# Patient Record
Sex: Female | Born: 1995 | Race: Black or African American | Hispanic: No | Marital: Single | Smoking: Never smoker
Health system: Southern US, Community
[De-identification: ages and names within clinical notes are randomized; demographics above are authoritative.]

## PROBLEM LIST (undated history)

## (undated) ENCOUNTER — Inpatient Hospital Stay (HOSPITAL_COMMUNITY): Payer: Self-pay

## (undated) DIAGNOSIS — Z789 Other specified health status: Secondary | ICD-10-CM

## (undated) DIAGNOSIS — A5602 Chlamydial vulvovaginitis: Secondary | ICD-10-CM

## (undated) DIAGNOSIS — D649 Anemia, unspecified: Secondary | ICD-10-CM

## (undated) DIAGNOSIS — I10 Essential (primary) hypertension: Secondary | ICD-10-CM

---
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
## 2015-01-12 ENCOUNTER — Inpatient Hospital Stay (HOSPITAL_COMMUNITY)
Admission: AD | Admit: 2015-01-12 | Discharge: 2015-01-13 | Disposition: A | Discharge status: Home / Self Care | Payer: Medicaid Other | Source: Ambulatory Visit | Attending: Obstetrics and Gynecology | Admitting: Obstetrics and Gynecology

## 2015-01-12 DIAGNOSIS — O26893 Other specified pregnancy related conditions, third trimester: Secondary | ICD-10-CM | POA: Insufficient documentation

## 2015-01-12 DIAGNOSIS — R1084 Generalized abdominal pain: Secondary | ICD-10-CM | POA: Insufficient documentation

## 2015-01-12 DIAGNOSIS — A5602 Chlamydial vulvovaginitis: Secondary | ICD-10-CM | POA: Diagnosis present

## 2015-01-12 DIAGNOSIS — A5609 Other chlamydial infection of lower genitourinary tract: Secondary | ICD-10-CM

## 2015-01-12 DIAGNOSIS — Z3A26 26 weeks gestation of pregnancy: Secondary | ICD-10-CM | POA: Insufficient documentation

## 2015-01-12 DIAGNOSIS — Z3492 Encounter for supervision of normal pregnancy, unspecified, second trimester: Secondary | ICD-10-CM

## 2015-01-12 NOTE — MAU Note (Signed)
Pt reports lower abd cramping since this am, some dysuria. Denies bleeding.

## 2015-01-13 ENCOUNTER — Telehealth: Payer: Self-pay | Admitting: *Deleted

## 2015-01-13 ENCOUNTER — Telehealth: Payer: Self-pay | Admitting: Obstetrics and Gynecology

## 2015-01-13 ENCOUNTER — Encounter (HOSPITAL_COMMUNITY): Payer: Self-pay | Admitting: *Deleted

## 2015-01-13 DIAGNOSIS — O98312 Other infections with a predominantly sexual mode of transmission complicating pregnancy, second trimester: Secondary | ICD-10-CM

## 2015-01-13 DIAGNOSIS — R1084 Generalized abdominal pain: Secondary | ICD-10-CM | POA: Diagnosis present

## 2015-01-13 DIAGNOSIS — O9989 Other specified diseases and conditions complicating pregnancy, childbirth and the puerperium: Secondary | ICD-10-CM

## 2015-01-13 DIAGNOSIS — A5609 Other chlamydial infection of lower genitourinary tract: Secondary | ICD-10-CM

## 2015-01-13 DIAGNOSIS — Z3A26 26 weeks gestation of pregnancy: Secondary | ICD-10-CM

## 2015-01-13 DIAGNOSIS — A5602 Chlamydial vulvovaginitis: Secondary | ICD-10-CM

## 2015-01-13 DIAGNOSIS — R109 Unspecified abdominal pain: Secondary | ICD-10-CM

## 2015-01-13 DIAGNOSIS — O26893 Other specified pregnancy related conditions, third trimester: Secondary | ICD-10-CM | POA: Diagnosis not present

## 2015-01-13 HISTORY — DX: Other chlamydial infection of lower genitourinary tract: A56.09

## 2015-01-13 HISTORY — DX: Chlamydial vulvovaginitis: A56.02

## 2015-01-13 LAB — URINE MICROSCOPIC-ADD ON

## 2015-01-13 LAB — URINALYSIS, ROUTINE W REFLEX MICROSCOPIC
Bilirubin Urine: NEGATIVE
GLUCOSE, UA: NEGATIVE mg/dL
Hgb urine dipstick: NEGATIVE
Ketones, ur: NEGATIVE mg/dL
Nitrite: NEGATIVE
PROTEIN: NEGATIVE mg/dL
SPECIFIC GRAVITY, URINE: 1.025 (ref 1.005–1.030)
Urobilinogen, UA: 0.2 mg/dL (ref 0.0–1.0)
pH: 7 (ref 5.0–8.0)

## 2015-01-13 LAB — GC/CHLAMYDIA PROBE AMP (~~LOC~~) NOT AT ARMC
CHLAMYDIA, DNA PROBE: POSITIVE — AB
Neisseria Gonorrhea: NEGATIVE

## 2015-01-13 LAB — WET PREP, GENITAL
Clue Cells Wet Prep HPF POC: NONE SEEN
Trich, Wet Prep: NONE SEEN
Yeast Wet Prep HPF POC: NONE SEEN

## 2015-01-13 MED ORDER — AZITHROMYCIN 500 MG PO TABS: 1000.0000 mg | ORAL_TABLET | Freq: Once | ORAL | Status: DC

## 2015-01-13 NOTE — MAU Provider Note (Signed)
History     CSN: [id number]  Arrival date and time: 01/12/15 2343   First Provider Initiated Contact with Patient 01/13/15 0056      No chief complaint on file.  HPI  Patient is 19 y.o. G2P1001 [redacted]w[redacted]d here with complaints of abdominal pain.  Ms. Steines reports that earlier today she began experiencing generalized abdominal pain. The pain has now resolved. She said the last time she felt the pain was early afternoon (~12 hours ago). Denies contractions, vaginal discharge, LOF, VB. Endorses FM.    History reviewed. No pertinent past medical history.   History reviewed. No pertinent past surgical history.  History reviewed. No pertinent family history.  Social History  Substance Use Topics  . Smoking status: Never Smoker   . Smokeless tobacco: Never Used  . Alcohol Use: No    Allergies: Not on File  No prescriptions prior to admission    Review of Systems  Gastrointestinal: Positive for abdominal pain. Negative for nausea and vomiting.  Genitourinary: Negative for dysuria and urgency.   Physical Exam   Blood pressure 108/56, pulse 107, temperature 98.4 F (36.9 C), temperature source Oral, resp. rate 18, height 5' 2.5" (1.588 m), weight 145 lb (65.772 kg), SpO2 100 %.  Physical Exam  Nursing note and vitals reviewed. Constitutional: She is oriented to person, place, and time. She appears well-developed and well-nourished. No distress.  HENT:  Head: Normocephalic and atraumatic.  Cardiovascular: Normal rate and regular rhythm.   Respiratory: Effort normal. No respiratory distress.  GI: Soft. There is no tenderness.  Gravid  Genitourinary:  Speculum exam - small white vaginal discharge noted, friable cervix, no pooling of liquid Cervical exam - cervix closed and thick   Musculoskeletal: Normal range of motion. She exhibits no edema or tenderness.  Neurological: She is alert and oriented to person, place, and time. No cranial nerve deficit.  Skin: Skin is warm and  dry.  Psychiatric: She has a normal mood and affect. Her behavior is normal.    MAU Course  Procedures None  MDM Speculum exam -small white discharge noted, minimal blood after swabbing cervix, no pooling of fluid Cervix check - not dilated, thick UA - minimal leukocytes, no other abnormalities GC/chlamydia - pending Wet prep - few WBC, no other abnormalities  No contractions seen on strip  Assessment and Plan  \A: Patient is 19 y.o. G2P1001 [redacted]w[redacted]d reporting abdominal pain. Given clear UA, unlikely to be caused by UTI/other urinary tract etiology. Negative wet prep rules out other infectious etiologies. Given closed cervix and no contractions seen on monitoring, patient is not in labor at this time. Will discharge home.   P: Discharge home - Handout given regarding second trimester pregnancy  - Follow-up with OB provider - F/u on GC/chlamydia  - Have scheduled outpatient anatomy US for patient given lack of prenatal imaging thus far   Tarri Abernethy, MD PGY-1 [facility] 01/13/2015, 2:22 AM   CNM attestation:  I have seen and examined this patient; I agree with above documentation in the resident's note.   KYNSLEE BAHAM is a 19 y.o. G2P1001 reporting previous abd pain +FM, denies LOF, VB, contractions, vaginal discharge.  PE: BP 108/56 mmHg  Pulse 107  Temp(Src) 98.4 F (36.9 C) (Oral)  Resp 18  Ht 5' 2.5" (1.588 m)  Wt 65.772 kg (145 lb)  BMI 26.08 kg/m2  SpO2 100%  LMP  Gen: calm comfortable, NAD Resp: normal effort, no distress Abd: gravid  ROS,  labs, PMH reviewed NST reactive   Plan: D/C home w/ order for out pt anatomy [facility] due to gestation- number given to establish care  Cam Hai, CNM 6:47 AM 01/13/2015

## 2015-01-13 NOTE — Discharge Instructions (Signed)
For assistance with applying for medicaid please call Fausto Skillern [phone] or Anne Hahn [phone].   Second Trimester of Pregnancy The second trimester is from week 13 through week 28, months 4 through 6. The second trimester is often a time when you feel your best. Your body has also adjusted to being pregnant, and you begin to feel better physically. Usually, morning sickness has lessened or quit completely, you may have more energy, and you may have an increase in appetite. The second trimester is also a time when the fetus is growing rapidly. At the end of the sixth month, the fetus is about 9 inches long and weighs about 1 pounds. You will likely begin to feel the baby move (quickening) between 18 and 20 weeks of the pregnancy. BODY CHANGES Your body goes through many changes during pregnancy. The changes vary from woman to woman.   Your weight will continue to increase. You will notice your lower abdomen bulging out.  You may begin to get stretch marks on your hips, abdomen, and breasts.  You may develop headaches that can be relieved by medicines approved by your health care provider.  You may urinate more often because the fetus is pressing on your bladder.  You may develop or continue to have heartburn as a result of your pregnancy.  You may develop constipation because certain hormones are causing the muscles that push waste through your intestines to slow down.  You may develop hemorrhoids or swollen, bulging veins (varicose veins).  You may have back pain because of the weight gain and pregnancy hormones relaxing your joints between the bones in your pelvis and as a result of a shift in weight and the muscles that support your balance.  Your breasts will continue to grow and be tender.  Your gums may bleed and may be sensitive to brushing and flossing.  Dark spots or blotches (chloasma, mask of pregnancy) may develop on your face. This will likely fade after the baby is  born.  A dark line from your belly button to the pubic area (linea nigra) may appear. This will likely fade after the baby is born.  You may have changes in your hair. These can include thickening of your hair, rapid growth, and changes in texture. Some women also have hair loss during or after pregnancy, or hair that feels dry or thin. Your hair will most likely return to normal after your baby is born. WHAT TO EXPECT AT YOUR PRENATAL VISITS During a routine prenatal visit:  You will be weighed to make sure you and the fetus are growing normally.  Your blood pressure will be taken.  Your abdomen will be measured to track your baby's growth.  The fetal heartbeat will be listened to.  Any test results from the previous visit will be discussed. Your health care provider may ask you:  How you are feeling.  If you are feeling the baby move.  If you have had any abnormal symptoms, such as leaking fluid, bleeding, severe headaches, or abdominal cramping.  If you have any questions. Other tests that may be performed during your second trimester include:  Blood tests that check for:  Low iron levels (anemia).  Gestational diabetes (between 24 and 28 weeks).  Rh antibodies.  Urine tests to check for infections, diabetes, or protein in the urine.  An ultrasound to confirm the proper growth and development of the baby.  An amniocentesis to check for possible genetic problems.  Fetal screens for  bifida and Down syndrome. °HOME CARE INSTRUCTIONS  °· Avoid all smoking, herbs, alcohol, and unprescribed drugs. These chemicals affect the formation and growth of the baby. °· Follow your health care provider's instructions regarding medicine use. There are medicines that are either safe or unsafe to take during pregnancy. °· Exercise only as directed by your health care provider. Experiencing uterine cramps is a good sign to stop exercising. °· Continue to eat regular, healthy  meals. °· Wear a good support bra for breast tenderness. °· Do not use hot tubs, steam rooms, or saunas. °· Wear your seat belt at all times when driving. °· Avoid raw meat, uncooked cheese, cat litter boxes, and soil used by cats. These carry germs that can cause birth defects in the baby. °· Take your prenatal vitamins. °· Try taking a stool softener (if your health care provider approves) if you develop constipation. Eat more high-fiber foods, such as fresh vegetables or fruit and whole grains. Drink plenty of fluids to keep your urine clear or pale yellow. °· Take warm sitz baths to soothe any pain or discomfort caused by hemorrhoids. Use hemorrhoid cream if your health care provider approves. °· If you develop varicose veins, wear support hose. Elevate your feet for 15 minutes, 3-4 times a day. Limit salt in your diet. °· Avoid heavy lifting, wear low heel shoes, and practice good posture. °· Rest with your legs elevated if you have leg cramps or low back pain. °· Visit your dentist if you have not gone yet during your pregnancy. Use a soft toothbrush to brush your teeth and be gentle when you floss. °· A sexual relationship may be continued unless your health care provider directs you otherwise. °· Continue to go to all your prenatal visits as directed by your health care provider. °SEEK MEDICAL CARE IF:  °· You have dizziness. °· You have mild pelvic cramps, pelvic pressure, or nagging pain in the abdominal area. °· You have persistent nausea, vomiting, or diarrhea. °· You have a bad smelling vaginal discharge. °· You have pain with urination. °SEEK IMMEDIATE MEDICAL CARE IF:  °· You have a fever. °· You are leaking fluid from your vagina. °· You have spotting or bleeding from your vagina. °· You have severe abdominal cramping or pain. °· You have rapid weight gain or loss. °· You have shortness of breath with chest pain. °· You notice sudden or extreme swelling of your face, hands, ankles, feet, or  legs. °· You have not felt your baby move in over an hour. °· You have severe headaches that do not go away with medicine. °· You have vision changes. °Document Released: 04/17/2001 Document Revised: 04/28/2013 Document Reviewed: 06/24/2012 °[employer]® Patient Information ©2015 [employer]. This information is not intended to replace advice given to you by your health care provider. Make sure you discuss any questions you have with your health care provider. ° °

## 2015-01-13 NOTE — Progress Notes (Signed)
Dr Natale Milch notified of pt's complaints, states she will come and evaluate pt

## 2015-01-13 NOTE — Telephone Encounter (Signed)
Patient called and left message that she wants to schedule an [place] appointment. [place] scheduled for 01/24/15 @ 9:30 am. Patient was called and given appointment date and time. Patient wants to start prenatal care. Informed patient that the front office will contact her for appointment scheduling. Patient verbalized understanding and has no other questions.

## 2015-01-13 NOTE — Telephone Encounter (Signed)
Pt informed of Pos Chlamydia, she is not currently with FOB, informed pt that FOB will need tx also. Pt Rx for azithromycin called to [facility] [address]

## 2015-01-17 NOTE — Progress Notes (Deleted)
Received referral to follow up with patient regarding Advance Directives.  Pt was with FOB and in labor.  Provided education regarding Advance Directives and patient decided she did not want to complete one at this time.  Pt is not married to FOB and does not want him to be the one who makes medical decisions on her behalf she would like her parents to continue to play this role as is currently indicated by [place] law.  Left pt and family with educational material "Do You Need Advance Directives?"  No further needs at this time.  [address] [place], [place] [phone]

## 2015-01-21 ENCOUNTER — Other Ambulatory Visit (HOSPITAL_COMMUNITY): Payer: Self-pay | Admitting: Advanced Practice Midwife

## 2015-01-21 DIAGNOSIS — Z3492 Encounter for supervision of normal pregnancy, unspecified, second trimester: Secondary | ICD-10-CM

## 2015-01-21 DIAGNOSIS — Z3A27 27 weeks gestation of pregnancy: Secondary | ICD-10-CM

## 2015-01-24 ENCOUNTER — Ambulatory Visit (HOSPITAL_COMMUNITY)
Admission: RE | Admit: 2015-01-24 | Discharge: 2015-01-24 | Disposition: A | Discharge status: Home / Self Care | Payer: Medicaid Other | Source: Ambulatory Visit | Attending: Advanced Practice Midwife | Admitting: Advanced Practice Midwife

## 2015-01-24 DIAGNOSIS — Z3A27 27 weeks gestation of pregnancy: Secondary | ICD-10-CM | POA: Diagnosis not present

## 2015-01-24 DIAGNOSIS — Z3492 Encounter for supervision of normal pregnancy, unspecified, second trimester: Secondary | ICD-10-CM

## 2015-01-24 DIAGNOSIS — Z36 Encounter for antenatal screening of mother: Secondary | ICD-10-CM | POA: Insufficient documentation

## 2015-02-03 ENCOUNTER — Encounter: Payer: Self-pay | Admitting: Family

## 2015-02-18 ENCOUNTER — Telehealth: Payer: Self-pay | Admitting: *Deleted

## 2015-02-18 MED ORDER — AZITHROMYCIN 500 MG PO TABS: 1000.0000 mg | ORAL_TABLET | Freq: Once | ORAL | Status: DC

## 2015-02-18 NOTE — Telephone Encounter (Signed)
Pt left message stating that she went to the pharmacy to pick up her medication and was told that they had not received the prescription. Per chart review, Rx did not get e-prescribed properly.  Pt advised that it I will re-send now and she should go today to pick it up.  Pt states that she did not previously have transportation to get the medication. She confirms that she has not had intercourse since being informed of + Chlamydia infection on 9/8 by Dr. Emelda FearFerguson and that she has informed the FOB that he needs treatment. Pt voiced understanding of all information given.

## 2015-03-01 ENCOUNTER — Encounter: Payer: Self-pay | Admitting: Family

## 2015-04-01 ENCOUNTER — Encounter (HOSPITAL_COMMUNITY): Payer: Self-pay

## 2015-04-01 ENCOUNTER — Inpatient Hospital Stay (HOSPITAL_COMMUNITY)
Admission: AD | Admit: 2015-04-01 | Discharge: 2015-04-01 | Disposition: A | Discharge status: Home / Self Care | Payer: Medicaid Other | Source: Ambulatory Visit | Attending: Family Medicine | Admitting: Family Medicine

## 2015-04-01 DIAGNOSIS — O0933 Supervision of pregnancy with insufficient antenatal care, third trimester: Secondary | ICD-10-CM | POA: Diagnosis not present

## 2015-04-01 DIAGNOSIS — Z3A37 37 weeks gestation of pregnancy: Secondary | ICD-10-CM | POA: Diagnosis not present

## 2015-04-01 DIAGNOSIS — Z283 Underimmunization status: Secondary | ICD-10-CM

## 2015-04-01 DIAGNOSIS — O9989 Other specified diseases and conditions complicating pregnancy, childbirth and the puerperium: Secondary | ICD-10-CM

## 2015-04-01 HISTORY — DX: Other specified health status: Z78.9

## 2015-04-01 LAB — URINALYSIS, ROUTINE W REFLEX MICROSCOPIC
BILIRUBIN URINE: NEGATIVE
Glucose, UA: NEGATIVE mg/dL
Hgb urine dipstick: NEGATIVE
KETONES UR: NEGATIVE mg/dL
NITRITE: NEGATIVE
Protein, ur: NEGATIVE mg/dL
Specific Gravity, Urine: 1.02 (ref 1.005–1.030)
pH: 7.5 (ref 5.0–8.0)

## 2015-04-01 LAB — URINE MICROSCOPIC-ADD ON

## 2015-04-01 LAB — CBC
HEMATOCRIT: 30.8 % — AB (ref 36.0–46.0)
HEMOGLOBIN: 10.2 g/dL — AB (ref 12.0–15.0)
MCH: 26.9 pg (ref 26.0–34.0)
MCHC: 33.1 g/dL (ref 30.0–36.0)
MCV: 81.3 fL (ref 78.0–100.0)
Platelets: 155 10*3/uL (ref 150–400)
RBC: 3.79 MIL/uL — AB (ref 3.87–5.11)
RDW: 14 % (ref 11.5–15.5)
WBC: 7.1 10*3/uL (ref 4.0–10.5)

## 2015-04-01 LAB — DIFFERENTIAL
Basophils Absolute: 0 10*3/uL (ref 0.0–0.1)
Basophils Relative: 0 %
EOS PCT: 0 %
Eosinophils Absolute: 0 10*3/uL (ref 0.0–0.7)
LYMPHS PCT: 17 %
Lymphs Abs: 1.2 10*3/uL (ref 0.7–4.0)
MONO ABS: 0.7 10*3/uL (ref 0.1–1.0)
MONOS PCT: 10 %
Neutro Abs: 5.1 10*3/uL (ref 1.7–7.7)
Neutrophils Relative %: 73 %

## 2015-04-01 LAB — GROUP B STREP BY PCR: Group B strep by PCR: NEGATIVE

## 2015-04-01 LAB — TYPE AND SCREEN
ABO/RH(D): B NEG
Antibody Screen: NEGATIVE

## 2015-04-01 LAB — ABO/RH: ABO/RH(D): B NEG

## 2015-04-01 LAB — RAPID HIV SCREEN (HIV 1/2 AB+AG)
HIV 1/2 ANTIBODIES: NONREACTIVE
HIV-1 P24 Antigen - HIV24: NONREACTIVE

## 2015-04-01 NOTE — Discharge Instructions (Signed)
Braxton Hicks Contractions Contractions of the uterus can occur throughout pregnancy. Contractions are not always a sign that you are in labor.  WHAT ARE BRAXTON HICKS CONTRACTIONS?  Contractions that occur before labor are called Braxton Hicks contractions, or false labor. Toward the end of pregnancy (32-34 weeks), these contractions can develop more often and may become more forceful. This is not true labor because these contractions do not result in opening (dilatation) and thinning of the cervix. They are sometimes difficult to tell apart from true labor because these contractions can be forceful and people have different pain tolerances. You should not feel embarrassed if you go to the hospital with false labor. Sometimes, the only way to tell if you are in true labor is for your health care provider to look for changes in the cervix. If there are no prenatal problems or other health problems associated with the pregnancy, it is completely safe to be sent home with false labor and await the onset of true labor. HOW CAN YOU TELL THE DIFFERENCE BETWEEN TRUE AND FALSE LABOR? False Labor  The contractions of false labor are usually shorter and not as hard as those of true labor.   The contractions are usually irregular.   The contractions are often felt in the front of the lower abdomen and in the groin.   The contractions may go away when you walk around or change positions while lying down.   The contractions get weaker and are shorter lasting as time goes on.   The contractions do not usually become progressively stronger, regular, and closer together as with true labor.  True Labor  Contractions in true labor last 30-70 seconds, become very regular, usually become more intense, and increase in frequency.   The contractions do not go away with walking.   The discomfort is usually felt in the top of the uterus and spreads to the lower abdomen and low back.   True labor can be  determined by your health care provider with an exam. This will show that the cervix is dilating and getting thinner.  WHAT TO REMEMBER  Keep up with your usual exercises and follow other instructions given by your health care provider.   Take medicines as directed by your health care provider.   Keep your regular prenatal appointments.   Eat and drink lightly if you think you are going into labor.   If Braxton Hicks contractions are making you uncomfortable:   Change your position from lying down or resting to walking, or from walking to resting.   Sit and rest in a tub of warm water.   Drink 2-3 glasses of water. Dehydration may cause these contractions.   Do slow and deep breathing several times an hour.  WHEN SHOULD I SEEK IMMEDIATE MEDICAL CARE? Seek immediate medical care if:  Your contractions become stronger, more regular, and closer together.   You have fluid leaking or gushing from your vagina.   You have a fever.   You pass blood-tinged mucus.   You have vaginal bleeding.   You have continuous abdominal pain.   You have low back pain that you never had before.   You feel your baby's head pushing down and causing pelvic pressure.   Your baby is not moving as much as it used to.    This information is not intended to replace advice given to you by your health care provider. Make sure you discuss any questions you have with your health care  provider.   Document Released: 04/23/2005 Document Revised: 04/28/2013 Document Reviewed: 02/02/2013 Elsevier Interactive Patient Education 2016 [employer]  Vaginal Delivery During delivery, your health care provider will help you give birth to your baby. During a vaginal delivery, you will work to push the baby out of your vagina. However, before you can push your baby out, a few things need to happen. The opening of your uterus (cervix) has to soften, thin out, and open up (dilate) all the way to 10  cm. Also, your baby has to move down from the uterus into your vagina.  SIGNS OF LABOR  Your health care provider will first need to make sure you are in labor. Signs of labor include:   Passing what is called the mucous plug before labor begins. This is a small amount of blood-stained mucus.  Having regular, painful uterine contractions.   The time between contractions gets shorter.   The discomfort and pain gradually get more intense.  Contraction pains get worse when walking and do not go away when resting.   Your cervix becomes thinner (effacement) and dilates. BEFORE THE DELIVERY Once you are in labor and admitted into the hospital or care center, your health care provider may do the following:   Perform a complete physical exam.  Review any complications related to pregnancy or labor.  Check your blood pressure, pulse, temperature, and heart rate (vital signs).   Determine if, and when, the rupture of amniotic membranes occurred.  Do a vaginal exam (using a sterile glove and lubricant) to determine:   The position (presentation) of the baby. Is the baby's head presenting first (vertex) in the birth canal (vagina), or are the feet or buttocks first (breech)?   The level (station) of the baby's head within the birth canal.   The effacement and dilatation of the cervix.   An electronic fetal monitor is usually placed on your abdomen when you first arrive. This is used to monitor your contractions and the baby's heart rate.  When the monitor is on your abdomen (external fetal monitor), it can only pick up the frequency and length of your contractions. It cannot tell the strength of your contractions.  If it becomes necessary for your health care provider to know exactly how strong your contractions are or to see exactly what the baby's heart rate is doing, an internal monitor may be inserted into your vagina and uterus. Your health care provider will discuss the  benefits and risks of using an internal monitor and obtain your permission before inserting the device.  Continuous fetal monitoring may be needed if you have an epidural, are receiving certain medicines (such as oxytocin), or have pregnancy or labor complications.  An IV access tube may be placed into a vein in your arm to deliver fluids and medicines if necessary. THREE STAGES OF LABOR AND DELIVERY Normal labor and delivery is divided into three stages. First Stage This stage starts when you begin to contract regularly and your cervix begins to efface and dilate. It ends when your cervix is completely open (fully dilated). The first stage is the longest stage of labor and can last from 3 hours to 15 hours.  Several methods are available to help with labor pain. You and your health care provider will decide which option is best for you. Options include:   Opioid medicines. These are strong pain medicines that you can get through your IV tube or as a shot into your muscle. These medicines  lessen pain but do not make it go away completely.  Epidural. A medicine is given through a thin tube that is inserted in your back. The medicine numbs the lower part of your body and prevents any pain in that area.  Paracervical pain medicine. This is an injection of an anesthetic on each side of your cervix.   You may request natural childbirth, which does not involve the use of pain medicines or an epidural during labor and delivery. Instead, you will use other things, such as breathing exercises, to help cope with the pain. Second Stage The second stage of labor begins when your cervix is fully dilated at 10 cm. It continues until you push your baby down through the birth canal and the baby is born. This stage can take only minutes or several hours.  The location of your baby's head as it moves through the birth canal is reported as a number called a station. If the baby's head has not started its  descent, the station is described as being at minus 3 (-3). When your baby's head is at the zero station, it is at the middle of the birth canal and is engaged in the pelvis. The station of your baby helps indicate the progress of the second stage of labor.  When your baby is born, your health care provider may hold the baby with his or her head lowered to prevent amniotic fluid, mucus, and blood from getting into the baby's lungs. The baby's mouth and nose may be suctioned with a small bulb syringe to remove any additional fluid.  Your health care provider may then place the baby on your stomach. It is important to keep the baby from getting cold. To do this, the health care provider will dry the baby off, place the baby directly on your skin (with no blankets between you and the baby), and cover the baby with warm, dry blankets.   The umbilical cord is cut. Third Stage During the third stage of labor, your health care provider will deliver the placenta (afterbirth) and make sure your bleeding is under control. The delivery of the placenta usually takes about 5 minutes but can take up to 30 minutes. After the placenta is delivered, a medicine may be given either by IV or injection to help contract the uterus and control bleeding. If you are planning to breastfeed, you can try to do so now. After you deliver the placenta, your uterus should contract and get very firm. If your uterus does not remain firm, your health care provider will massage it. This is important because the contraction of the uterus helps cut off bleeding at the site where the placenta was attached to your uterus. If your uterus does not contract properly and stay firm, you may continue to bleed heavily. If there is a lot of bleeding, medicines may be given to contract the uterus and stop the bleeding.    This information is not intended to replace advice given to you by your health care provider. Make sure you discuss any questions  you have with your health care provider.   Document Released: 01/31/2008 Document Revised: 05/14/2014 Document Reviewed: 12/19/2011 Elsevier Interactive Patient Education [employer]

## 2015-04-01 NOTE — MAU Provider Note (Signed)
First Provider Initiated Contact with Patient 04/01/15 2129      Chief Complaint:  Contractions   Samantha Hood is  19 y.o. G2P1001 at 5211w1d presents complaining of Contractions .  She states irregular, every 10 minutes contractions are associated with none vaginal bleeding, intact membranes, along with active fetal movement. She says that the contractions earlier in the day "took her breath away", but this has improved now. She has not had any fever , chills, hematuria, dysuria, suprapubic pain, or flank pain.   Of note: pt. Has had limited PNC. [facility] for her previous pregnancy. She says she has an appointment there for 11/28 for initial OB. She has been seen in the MAU here once during this pregnancy, but no other records of prenatal care here. By the chart, she had called and tried to schedule an appointment. She did call and set up an ultrasound for herself. She was treated for Trichomonas from the MAU on 01/12/2015.   Sonogram:  3218w5d with EFW 40%, and normal anatomy.   Obstetrical/Gynecological History: OB History    Gravida Para Term Preterm AB TAB SAB Ectopic Multiple Living   2 1 1       1      Past Medical History: Past Medical History  Diagnosis Date  . Medical history non-contributory     Past Surgical History: Past Surgical History  Procedure Laterality Date  . No past surgeries      Family History: History reviewed. No pertinent family history.  Social History: Social History  Substance Use Topics  . Smoking status: Never Smoker   . Smokeless tobacco: Never Used  . Alcohol Use: No    Allergies: No Known Allergies  Meds:  Prescriptions prior to admission  Medication Sig Dispense Refill Last Dose  . Prenatal Vit-Fe Fumarate-FA (PRENATAL VITAMINS PLUS) 27-1 MG TABS Take 1 tablet by mouth daily. 30 tablet 11 Past Week at Unknown time  . azithromycin (ZITHROMAX) 500 MG tablet Take 2 tablets (1,000 mg  total) by mouth once. (Patient not taking: Reported on 04/01/2015) 2 tablet 1 Completed Course at Unknown time    Review of Systems -   Review of Systems  Constitutional: Negative for fever, chills, weight loss, malaise/fatigue and diaphoresis.  HENT: Negative for hearing loss, ear pain, nosebleeds, congestion, sore throat, neck pain, tinnitus and ear discharge.   Eyes: Negative for blurred vision, double vision, photophobia, pain, discharge and redness.  Respiratory: Negative for cough, hemoptysis, sputum production, shortness of breath, wheezing and stridor.   Cardiovascular: Negative for chest pain, palpitations, orthopnea,  leg swelling  Gastrointestinal: Negative for abdominal pain heartburn, nausea, vomiting, diarrhea, constipation, blood in stool Genitourinary: Negative for dysuria, urgency, frequency, hematuria and flank pain.  Musculoskeletal: Negative for myalgias, back pain, joint pain and falls.  Skin: Negative for itching and rash.  Neurological: Negative for dizziness, tingling, tremors, sensory change, speech change, focal weakness, seizures, loss of consciousness, weakness and headaches.  Endo/Heme/Allergies: Negative for environmental allergies and polydipsia. Does not bruise/bleed easily.  Psychiatric/Behavioral: Negative for depression, suicidal ideas, hallucinations, memory loss and substance abuse. The patient is not nervous/anxious and does not have insomnia.      Physical Exam  Blood pressure 133/60, pulse 101, temperature 98.7 F (37.1 C), resp. rate 18. GENERAL: Well-developed, well-nourished female in no acute distress.  LUNGS: Clear to auscultation bilaterally.  HEART: Regular rate and rhythm. ABDOMEN: Soft, nontender, nondistended, gravid.  EXTREMITIES: Nontender, no edema, 2+  distal pulses. DTR's 2+ CERVICAL EXAM: Dilatation 0.5cm   Effacement 20%   Station -3   Presentation: cephalic FHT:  Baseline rate 125 bpm   Variability moderate  Accelerations  present   Decelerations none Contractions: More like uterine irritability now, previously every 12 min or so.    Labs: Results for orders placed or performed during the hospital encounter of 04/01/15 (from the past 24 hour(s))  CBC   Collection Time: 04/01/15  8:00 PM  Result Value Ref Range   WBC 7.1 4.0 - 10.5 K/uL   RBC 3.79 (L) 3.87 - 5.11 MIL/uL   Hemoglobin 10.2 (L) 12.0 - 15.0 g/dL   HCT 40.9 (L) 81.1 - 91.4 %   MCV 81.3 78.0 - 100.0 fL   MCH 26.9 26.0 - 34.0 pg   MCHC 33.1 30.0 - 36.0 g/dL   RDW 78.2 95.6 - 21.3 %   Platelets 155 150 - 400 K/uL  Differential   Collection Time: 04/01/15  8:00 PM  Result Value Ref Range   Neutrophils Relative % 73 %   Neutro Abs 5.1 1.7 - 7.7 K/uL   Lymphocytes Relative 17 %   Lymphs Abs 1.2 0.7 - 4.0 K/uL   Monocytes Relative 10 %   Monocytes Absolute 0.7 0.1 - 1.0 K/uL   Eosinophils Relative 0 %   Eosinophils Absolute 0.0 0.0 - 0.7 K/uL   Basophils Relative 0 %   Basophils Absolute 0.0 0.0 - 0.1 K/uL  Rapid HIV screen (HIV 1/2 Ab+Ag)   Collection Time: 04/01/15  8:00 PM  Result Value Ref Range   HIV-1 P24 Antigen - HIV24 NON REACTIVE NON REACTIVE   HIV 1/2 Antibodies NON REACTIVE NON REACTIVE   Interpretation (HIV Ag Ab)      A non reactive test result means that HIV 1 or HIV 2 antibodies and HIV 1 p24 antigen were not detected in the specimen.  Type and screen [facility] [place]   Collection Time: 04/01/15  8:00 PM  Result Value Ref Range   ABO/RH(D) B NEG    Antibody Screen NEG    Sample Expiration 04/04/2015    Imaging Studies:  No results found.  Assessment: Samantha Hood is  19 y.o. G2P1001 at [redacted]w[redacted]d presents with contractions initially. Resolved. Pt. Not in active labor. Limited prenatal care. Reactive NST.   Plan: - Had + Chlamydia in the past. Repeated for TOC tonight.  - Prenatal Labs today. CBC, GBS, Blood typing, GC/Chlamydia, HIV, Rubella, Hep BSag, U/A.  - Planning to follow up with [facility]. Has appointment 11/28.  - Discharge home.  - Reviewed signs / symptoms of labor.  - Return precautions reviewed.   Samantha Hood 11/25/20169:35 PM

## 2015-04-01 NOTE — MAU Note (Signed)
Pt presents to MAU with complaints of contractions that started yesterday. Denies any vaginal of bleeding or LOF.

## 2015-04-01 NOTE — Progress Notes (Signed)
Pt reports having one prenatal visit at [facility].  [place] and states she has had 2 visits downstairs in clinic.  Pt signed release of info to obtain any records/prenatal visit info from [facility]. [facility] and they do not have any information on this patient.

## 2015-04-02 LAB — RPR: RPR Ser Ql: NONREACTIVE

## 2015-04-02 LAB — HEPATITIS B SURFACE ANTIGEN: Hepatitis B Surface Ag: NEGATIVE

## 2015-04-02 LAB — RUBELLA SCREEN

## 2015-04-03 DIAGNOSIS — O9989 Other specified diseases and conditions complicating pregnancy, childbirth and the puerperium: Secondary | ICD-10-CM

## 2015-04-03 DIAGNOSIS — Z283 Underimmunization status: Secondary | ICD-10-CM

## 2015-04-04 LAB — GC/CHLAMYDIA PROBE AMP (~~LOC~~) NOT AT ARMC
Chlamydia: NEGATIVE
Neisseria Gonorrhea: NEGATIVE

## 2015-05-16 DIAGNOSIS — O368990 Maternal care for other specified fetal problems, unspecified trimester, not applicable or unspecified: Secondary | ICD-10-CM

## 2015-09-01 ENCOUNTER — Encounter (HOSPITAL_COMMUNITY): Payer: Self-pay | Admitting: *Deleted

## 2015-10-15 ENCOUNTER — Encounter (HOSPITAL_COMMUNITY): Payer: Self-pay

## 2015-10-15 ENCOUNTER — Emergency Department (HOSPITAL_COMMUNITY)
Admission: EM | Admit: 2015-10-15 | Discharge: 2015-10-15 | Disposition: A | Discharge status: Home / Self Care | Payer: Medicaid Other | Attending: Emergency Medicine | Admitting: Emergency Medicine

## 2015-10-15 DIAGNOSIS — Z79899 Other long term (current) drug therapy: Secondary | ICD-10-CM | POA: Insufficient documentation

## 2015-10-15 DIAGNOSIS — N898 Other specified noninflammatory disorders of vagina: Secondary | ICD-10-CM

## 2015-10-15 DIAGNOSIS — N72 Inflammatory disease of cervix uteri: Secondary | ICD-10-CM

## 2015-10-15 DIAGNOSIS — R1030 Lower abdominal pain, unspecified: Secondary | ICD-10-CM | POA: Diagnosis present

## 2015-10-15 DIAGNOSIS — R300 Dysuria: Secondary | ICD-10-CM

## 2015-10-15 LAB — URINALYSIS, ROUTINE W REFLEX MICROSCOPIC
BILIRUBIN URINE: NEGATIVE
GLUCOSE, UA: NEGATIVE mg/dL
Hgb urine dipstick: NEGATIVE
KETONES UR: NEGATIVE mg/dL
NITRITE: NEGATIVE
PROTEIN: NEGATIVE mg/dL
Specific Gravity, Urine: 1.024 (ref 1.005–1.030)
pH: 7 (ref 5.0–8.0)

## 2015-10-15 LAB — COMPREHENSIVE METABOLIC PANEL
ALK PHOS: 60 U/L (ref 38–126)
ALT: 12 U/L — AB (ref 14–54)
ANION GAP: 6 (ref 5–15)
AST: 19 U/L (ref 15–41)
Albumin: 4.1 g/dL (ref 3.5–5.0)
BUN: 13 mg/dL (ref 6–20)
CALCIUM: 9.3 mg/dL (ref 8.9–10.3)
CO2: 22 mmol/L (ref 22–32)
CREATININE: 0.72 mg/dL (ref 0.44–1.00)
Chloride: 109 mmol/L (ref 101–111)
Glucose, Bld: 96 mg/dL (ref 65–99)
Potassium: 3.9 mmol/L (ref 3.5–5.1)
SODIUM: 137 mmol/L (ref 135–145)
TOTAL PROTEIN: 7.1 g/dL (ref 6.5–8.1)
Total Bilirubin: 0.4 mg/dL (ref 0.3–1.2)

## 2015-10-15 LAB — URINE MICROSCOPIC-ADD ON: RBC / HPF: NONE SEEN RBC/hpf (ref 0–5)

## 2015-10-15 LAB — CBC
HCT: 33.5 % — ABNORMAL LOW (ref 36.0–46.0)
HEMOGLOBIN: 10.6 g/dL — AB (ref 12.0–15.0)
MCH: 23.7 pg — AB (ref 26.0–34.0)
MCHC: 31.6 g/dL (ref 30.0–36.0)
MCV: 74.9 fL — ABNORMAL LOW (ref 78.0–100.0)
PLATELETS: 304 10*3/uL (ref 150–400)
RBC: 4.47 MIL/uL (ref 3.87–5.11)
RDW: 14.8 % (ref 11.5–15.5)
WBC: 4.6 10*3/uL (ref 4.0–10.5)

## 2015-10-15 LAB — LIPASE, BLOOD: Lipase: 28 U/L (ref 11–51)

## 2015-10-15 LAB — WET PREP, GENITAL
Clue Cells Wet Prep HPF POC: NONE SEEN
SPERM: NONE SEEN
TRICH WET PREP: NONE SEEN
WBC WET PREP: NONE SEEN
Yeast Wet Prep HPF POC: NONE SEEN

## 2015-10-15 LAB — POC URINE PREG, ED: Preg Test, Ur: NEGATIVE

## 2015-10-15 MED ORDER — DOXYCYCLINE HYCLATE 100 MG PO CAPS: 100.0000 mg | ORAL_CAPSULE | Freq: Two times a day (BID) | ORAL | Status: DC

## 2015-10-15 MED ORDER — CEFTRIAXONE SODIUM 250 MG IJ SOLR
250.0000 mg | Freq: Once | INTRAMUSCULAR | Status: AC
  Administered 2015-10-15: 250 mg via INTRAMUSCULAR
  Filled 2015-10-15: qty 250

## 2015-10-15 MED ORDER — DOXYCYCLINE HYCLATE 100 MG PO TABS
100.0000 mg | ORAL_TABLET | Freq: Once | ORAL | Status: AC
  Administered 2015-10-15: 100 mg via ORAL
  Filled 2015-10-15: qty 1

## 2015-10-15 MED ORDER — AZITHROMYCIN 250 MG PO TABS
1000.0000 mg | ORAL_TABLET | Freq: Once | ORAL | Status: AC
  Administered 2015-10-15: 1000 mg via ORAL
  Filled 2015-10-15: qty 4

## 2015-10-15 NOTE — Discharge Instructions (Signed)
1. Medications: Doxycycline, usual home medications 2. Treatment: rest, drink plenty of fluids,  3. Follow Up: Please followup with your primary doctor or OB/GYN in 3 days for discussion of your diagnoses and further evaluation after today's visit; if you do not have a primary care doctor use the resource guide provided to find one; Please return to the ER for worsening symptoms, fevers or vomiting    Cervicitis Cervicitis is a soreness and swelling (inflammation) of the cervix. Your cervix is located at the bottom of your uterus. It opens up to the vagina. CAUSES   Sexually transmitted infections (STIs).   Allergic reaction.   Medicines or birth control devices that are put in the vagina.   Injury to the cervix.   Bacterial infections.  RISK FACTORS You are at greater risk if you:  Have unprotected sexual intercourse.  Have sexual intercourse with many partners.  Began sexual intercourse at an early age.  Have a history of STIs. SYMPTOMS  There may be no symptoms. If symptoms occur, they may include:   Gray, white, yellow, or bad-smelling vaginal discharge.   Pain or itching of the area outside the vagina.   Painful sexual intercourse.   Lower abdominal or lower back pain, especially during intercourse.   Frequent urination.   Abnormal vaginal bleeding between periods, after sexual intercourse, or after menopause.   Pressure or a heavy feeling in the pelvis.  DIAGNOSIS  Diagnosis is made after a pelvic exam. Other tests may include:   Examination of any discharge under a microscope (wet prep).   A Pap test.  TREATMENT  Treatment will depend on the cause of cervicitis. If it is caused by an STI, both you and your partner will need to be treated. Antibiotic medicines will be given.  HOME CARE INSTRUCTIONS   Do not have sexual intercourse until your health care provider says it is okay.   Do not have sexual intercourse until your partner has been  treated, if your cervicitis is caused by an STI.   Take your antibiotics as directed. Finish them even if you start to feel better.  SEEK MEDICAL CARE IF:  Your symptoms come back.   You have a fever.  MAKE SURE YOU:   Understand these instructions.  Will watch your condition.  Will get help right away if you are not doing well or get worse.   This information is not intended to replace advice given to you by your health care provider. Make sure you discuss any questions you have with your health care provider.   Document Released: 04/23/2005 Document Revised: 04/28/2013 Document Reviewed: 10/15/2012 Elsevier Interactive Patient Education [employer]

## 2015-10-15 NOTE — ED Notes (Signed)
Pt requesting STD and pregnancy test. Pt states lower abdominal pain. Pt denies any burning or itching with urination, denies any fevers.

## 2015-10-15 NOTE — ED Provider Notes (Signed)
CSN: [id number]     Arrival date & time 10/15/15  0100 History   First MD Initiated Contact with Patient 10/15/15 0330     Chief Complaint  Patient presents with  . Abdominal Pain     (Consider location/radiation/quality/duration/timing/severity/associated sxs/prior Treatment) The history is provided by the patient and medical records. No language interpreter was used.     Samantha Hood is a 20 y.o. female G2P0202 with a hx of c-section (4 mos ago) presents to the Emergency Department complaining of exposure to STD onset 1 mo ago.  She reports 1 new female partner in the last 4 weeks.  Pt was informed by her partner today that he had an STD (unknown if this was gonorrhea or chlamydia).  Pt reports some lower abd pain over the last month though she thinks this is from her c-section.  Denies abd pain at present.  Pt also reports several weeks of dysuria and vaginal discharge.  She reports hx of chlamydia with this last pregnancy. Pt has taken pyridium with some improvement in the dysuria.  No aggravating symptoms.  Pt denies fever, chills, headache, neck pain, chest pain, SOB, N/V/D, weakness, dizziness, syncope.      Past Medical History  Diagnosis Date  . Medical history non-contributory    Past Surgical History  Procedure Laterality Date  . No past surgeries     History reviewed. No pertinent family history. Social History  Substance Use Topics  . Smoking status: Never Smoker   . Smokeless tobacco: Never Used  . Alcohol Use: No   OB History    Gravida Para Term Preterm AB TAB SAB Ectopic Multiple Living   2 1 1       1      Review of Systems  Constitutional: Negative for fever, diaphoresis, appetite change, fatigue and unexpected weight change.  HENT: Negative for mouth sores.   Eyes: Negative for visual disturbance.  Respiratory: Negative for cough, chest tightness, shortness of breath and wheezing.   Cardiovascular: Negative for chest pain.  Gastrointestinal: Positive for  abdominal pain ( lower). Negative for nausea, vomiting, diarrhea and constipation.  Endocrine: Negative for polydipsia, polyphagia and polyuria.  Genitourinary: Positive for dysuria and vaginal discharge. Negative for urgency, frequency and hematuria.  Musculoskeletal: Negative for back pain and neck stiffness.  Skin: Negative for rash.  Allergic/Immunologic: Negative for immunocompromised state.  Neurological: Negative for syncope, light-headedness and headaches.  Hematological: Does not bruise/bleed easily.  Psychiatric/Behavioral: Negative for sleep disturbance. The patient is not nervous/anxious.       Allergies  Review of patient's allergies indicates no known allergies.  Home Medications   Prior to Admission medications   Medication Sig Start Date End Date Taking? Authorizing Provider  doxycycline (VIBRAMYCIN) 100 MG capsule Take 1 capsule (100 mg total) by mouth 2 (two) times daily. 10/15/15   Gabbrielle Mcnicholas, PA-C   BP 122/61 mmHg  Pulse 79  Temp(Src) 98.1 F (36.7 C) (Oral)  Resp 16  SpO2 100%  LMP 10/02/2015 Physical Exam  Constitutional: She appears well-developed and well-nourished. No distress.  Awake, alert, nontoxic appearance  HENT:  Head: Normocephalic and atraumatic.  Mouth/Throat: Oropharynx is clear and moist. No oropharyngeal exudate.  Eyes: Conjunctivae are normal. No scleral icterus.  Neck: Normal range of motion. Neck supple.  Cardiovascular: Normal rate, regular rhythm, normal heart sounds and intact distal pulses.   No murmur heard. Pulmonary/Chest: Effort normal and breath sounds normal. No respiratory distress. She has no wheezes.  Equal chest  expansion  Abdominal: Soft. Bowel sounds are normal. She exhibits no mass. There is no tenderness. There is no rebound and no guarding. Hernia confirmed negative in the right inguinal area and confirmed negative in the left inguinal area.  Genitourinary: Uterus normal. No labial fusion. There is no rash,  tenderness or lesion on the right labia. There is no rash, tenderness or lesion on the left labia. Uterus is not deviated, not enlarged, not fixed and not tender. Cervix exhibits friability. Cervix exhibits no motion tenderness and no discharge. Right adnexum displays no mass, no tenderness and no fullness. Left adnexum displays no mass, no tenderness and no fullness. No erythema, tenderness or bleeding in the vagina. No foreign body around the vagina. No signs of injury around the vagina. Vaginal discharge (moderate, white, nonodorous) found.  Musculoskeletal: Normal range of motion. She exhibits no edema.  Lymphadenopathy:       Right: No inguinal adenopathy present.       Left: No inguinal adenopathy present.  Neurological: She is alert.  Speech is clear and goal oriented Moves extremities without ataxia  Skin: Skin is warm and dry. She is not diaphoretic. No erythema.  Psychiatric: She has a normal mood and affect.  Nursing note and vitals reviewed.   ED Course  Procedures (including critical care time) Labs Review Labs Reviewed  COMPREHENSIVE METABOLIC PANEL - Abnormal; Notable for the following:    ALT 12 (*)    All other components within normal limits  CBC - Abnormal; Notable for the following:    Hemoglobin 10.6 (*)    HCT 33.5 (*)    MCV 74.9 (*)    MCH 23.7 (*)    All other components within normal limits  URINALYSIS, ROUTINE W REFLEX MICROSCOPIC (NOT AT [facility]) - Abnormal; Notable for the following:    APPearance CLOUDY (*)    Leukocytes, UA SMALL (*)    All other components within normal limits  URINE MICROSCOPIC-ADD ON - Abnormal; Notable for the following:    Squamous Epithelial / LPF 0-5 (*)    Bacteria, UA RARE (*)    All other components within normal limits  WET PREP, GENITAL  URINE CULTURE  LIPASE, BLOOD  RPR  HIV ANTIBODY (ROUTINE TESTING)  POC URINE PREG, ED  GC/CHLAMYDIA PROBE AMP ([place]) NOT AT [facility]      MDM   Final diagnoses:  Vaginal  discharge  Dysuria  Cervicitis   Chloeanne J Drach labs are reassuring and abdomen is soft and nontender.  Analysis shows questionable urinary tract infection. Urine culture sent.  His with reported STD exposure.  Cervical friability but no cervical motion tenderness. Will treat for gonorrhea and chlamydia. HIV and RPR are pending.  Mild anemia noted. Patient without chest pain or shortness of breath.    Patient to be discharged with instructions to follow up with OBGYN. Discussed importance of using protection when sexually active. Pt understands that they have GC/Chlamydia cultures pending and that they will need to inform all sexual partners if results return positive. Pt has been treated prophylacticly with azithromycin and rocephin due to pts history and pelvic exam. Pt not concerning for PID because hemodynamically stable and no cervical motion tenderness on pelvic exam; however d/c with doxycycline for potentially infected urine and cervicitis. She is well appearing, afebrile and without tachycardia.   Dahlia Client Abbygale Lapid, PA-C 10/15/15 4098  Gwyneth Sprout, MD 10/15/15 [phone]

## 2015-10-16 LAB — URINE CULTURE

## 2015-10-16 LAB — HIV ANTIBODY (ROUTINE TESTING W REFLEX): HIV SCREEN 4TH GENERATION: NONREACTIVE

## 2015-10-17 LAB — GC/CHLAMYDIA PROBE AMP (~~LOC~~) NOT AT ARMC
Chlamydia: NEGATIVE
Neisseria Gonorrhea: POSITIVE — AB

## 2015-10-17 LAB — RPR: RPR: NONREACTIVE

## 2015-10-18 ENCOUNTER — Telehealth (HOSPITAL_BASED_OUTPATIENT_CLINIC_OR_DEPARTMENT_OTHER): Payer: Self-pay | Admitting: Emergency Medicine

## 2016-03-15 ENCOUNTER — Encounter (HOSPITAL_COMMUNITY): Payer: Self-pay | Admitting: Emergency Medicine

## 2016-03-15 ENCOUNTER — Emergency Department (HOSPITAL_COMMUNITY)
Admission: EM | Admit: 2016-03-15 | Discharge: 2016-03-15 | Disposition: A | Discharge status: Home / Self Care | Payer: Medicaid Other | Attending: Emergency Medicine | Admitting: Emergency Medicine

## 2016-03-15 DIAGNOSIS — R300 Dysuria: Secondary | ICD-10-CM | POA: Diagnosis present

## 2016-03-15 DIAGNOSIS — N390 Urinary tract infection, site not specified: Secondary | ICD-10-CM

## 2016-03-15 LAB — CBC WITH DIFFERENTIAL/PLATELET
BASOS PCT: 0 %
Basophils Absolute: 0 10*3/uL (ref 0.0–0.1)
EOS ABS: 0.1 10*3/uL (ref 0.0–0.7)
EOS PCT: 1 %
HCT: 34.5 % — ABNORMAL LOW (ref 36.0–46.0)
Hemoglobin: 11.3 g/dL — ABNORMAL LOW (ref 12.0–15.0)
LYMPHS ABS: 2.5 10*3/uL (ref 0.7–4.0)
Lymphocytes Relative: 50 %
MCH: 25.3 pg — AB (ref 26.0–34.0)
MCHC: 32.8 g/dL (ref 30.0–36.0)
MCV: 77.2 fL — ABNORMAL LOW (ref 78.0–100.0)
MONOS PCT: 7 %
Monocytes Absolute: 0.4 10*3/uL (ref 0.1–1.0)
NEUTROS PCT: 42 %
Neutro Abs: 2.1 10*3/uL (ref 1.7–7.7)
PLATELETS: 243 10*3/uL (ref 150–400)
RBC: 4.47 MIL/uL (ref 3.87–5.11)
RDW: 16.1 % — AB (ref 11.5–15.5)
WBC: 5.1 10*3/uL (ref 4.0–10.5)

## 2016-03-15 LAB — URINALYSIS, ROUTINE W REFLEX MICROSCOPIC
BILIRUBIN URINE: NEGATIVE
Glucose, UA: NEGATIVE mg/dL
KETONES UR: NEGATIVE mg/dL
NITRITE: POSITIVE — AB
PH: 7 (ref 5.0–8.0)
Protein, ur: 100 mg/dL — AB
SPECIFIC GRAVITY, URINE: 1.022 (ref 1.005–1.030)

## 2016-03-15 LAB — COMPREHENSIVE METABOLIC PANEL
ALBUMIN: 3.9 g/dL (ref 3.5–5.0)
ALK PHOS: 56 U/L (ref 38–126)
ALT: 12 U/L — ABNORMAL LOW (ref 14–54)
ANION GAP: 5 (ref 5–15)
AST: 18 U/L (ref 15–41)
BUN: 14 mg/dL (ref 6–20)
CALCIUM: 9.2 mg/dL (ref 8.9–10.3)
CHLORIDE: 110 mmol/L (ref 101–111)
CO2: 24 mmol/L (ref 22–32)
Creatinine, Ser: 0.83 mg/dL (ref 0.44–1.00)
GFR calc non Af Amer: >60 mL/min (ref >60)
GLUCOSE: 103 mg/dL — AB (ref 65–99)
POTASSIUM: 4.1 mmol/L (ref 3.5–5.1)
SODIUM: 139 mmol/L (ref 135–145)
Total Bilirubin: 0.6 mg/dL (ref 0.3–1.2)
Total Protein: 6.5 g/dL (ref 6.5–8.1)

## 2016-03-15 LAB — URINE MICROSCOPIC-ADD ON

## 2016-03-15 LAB — WET PREP, GENITAL
Clue Cells Wet Prep HPF POC: NONE SEEN
SPERM: NONE SEEN
TRICH WET PREP: NONE SEEN
Yeast Wet Prep HPF POC: NONE SEEN

## 2016-03-15 MED ORDER — PHENAZOPYRIDINE HCL 200 MG PO TABS: 200.0000 mg | ORAL_TABLET | Freq: Three times a day (TID) | ORAL | 0 refills | Status: DC | PRN

## 2016-03-15 MED ORDER — CEFTRIAXONE SODIUM 250 MG IJ SOLR
250.0000 mg | Freq: Once | INTRAMUSCULAR | Status: AC
  Administered 2016-03-15: 250 mg via INTRAMUSCULAR
  Filled 2016-03-15: qty 250

## 2016-03-15 MED ORDER — STERILE WATER FOR INJECTION IJ SOLN
INTRAMUSCULAR | Status: AC
  Administered 2016-03-15: 10 mL
  Filled 2016-03-15: qty 10

## 2016-03-15 MED ORDER — CEPHALEXIN 500 MG PO CAPS: 500.0000 mg | ORAL_CAPSULE | Freq: Four times a day (QID) | ORAL | 0 refills | Status: DC

## 2016-03-15 MED ORDER — AZITHROMYCIN 1 G PO PACK
1.0000 g | PACK | Freq: Once | ORAL | Status: AC
  Administered 2016-03-15: 1 g via ORAL
  Filled 2016-03-15: qty 1

## 2016-03-15 MED ORDER — PHENAZOPYRIDINE HCL 100 MG PO TABS
95.0000 mg | ORAL_TABLET | Freq: Once | ORAL | Status: AC
  Administered 2016-03-15: 100 mg via ORAL
  Filled 2016-03-15: qty 1

## 2016-03-15 NOTE — ED Triage Notes (Signed)
Pt c/o burning with urination and small amounts x's 3-4 days.  Also st's she has had a vag. Discharge  Also c/o lower abd pain

## 2016-03-15 NOTE — ED Triage Notes (Signed)
PT reports Vag. Discharge  For 6 days .

## 2016-03-15 NOTE — ED Provider Notes (Signed)
[facility]-EMERGENCY DEPT Provider Note   CSN: [id number] Arrival date & time: 03/15/16  1624     History   Chief Complaint Chief Complaint  Patient presents with  . Urinary Frequency    HPI Samantha Hood is a 20 y.o. female.   Dysuria   This is a new problem. The current episode started 2 days ago. The problem occurs every urination. The problem has been gradually worsening. The quality of the pain is described as burning and stabbing. The pain is mild. Associated symptoms include frequency. Pertinent negatives include no hematuria and no flank pain.    Past Medical History:  Diagnosis Date  . Medical history non-contributory     Patient Active Problem List   Diagnosis Date Noted  . Rubella non-immune status, antepartum 04/03/2015  . Chlamydia vaginitis/cervicitis 01/13/2015    Past Surgical History:  Procedure Laterality Date  . CESAREAN SECTION    . NO PAST SURGERIES      OB History    Gravida Para Term Preterm AB Living   2 1 1     1    SAB TAB Ectopic Multiple Live Births           1       Home Medications    Prior to Admission medications   Medication Sig Start Date End Date Taking? Authorizing Provider  cephALEXin (KEFLEX) 500 MG capsule Take 1 capsule (500 mg total) by mouth 4 (four) times daily. 03/15/16   Marily MemosJason Tennelle Taflinger, MD  phenazopyridine (PYRIDIUM) 200 MG tablet Take 1 tablet (200 mg total) by mouth 3 (three) times daily as needed for pain. 03/15/16   Marily MemosJason Vermon Grays, MD    Family History No family history on file.  Social History Social History  Substance Use Topics  . Smoking status: Never Smoker  . Smokeless tobacco: Never Used  . Alcohol use No     Allergies   Patient has no known allergies.   Review of Systems Review of Systems  Constitutional: Negative for fever.  Genitourinary: Positive for dysuria, frequency, pelvic pain, vaginal discharge and vaginal pain. Negative for decreased urine volume, flank pain, hematuria and vaginal  bleeding.  All other systems reviewed and are negative.    Physical Exam Updated Vital Signs BP 110/77   Pulse 100   Temp 98.8 F (37.1 C)   Resp 18   Ht 5\' 2"  (1.575 m)   Wt 110 lb (49.9 kg)   LMP 02/06/2016   SpO2 100%   BMI 20.12 kg/m   Physical Exam  Constitutional: She appears well-developed and well-nourished.  HENT:  Head: Normocephalic and atraumatic.  Eyes: Right eye exhibits no discharge. Left eye exhibits no discharge.  Neck: Normal range of motion.  Cardiovascular: Normal rate and regular rhythm.   Pulmonary/Chest: No stridor. No respiratory distress.  Abdominal: Soft. She exhibits no distension. There is tenderness (mininmal suprapubic).  Genitourinary: Pelvic exam was performed with patient supine. There is no rash on the right labia. There is tenderness (mild over left labia, no fluctuance, induration, erythema or warmth) on the left labia. There is no rash on the left labia.  Neurological: She is alert.  Skin: No erythema.  Nursing note and vitals reviewed.    ED Treatments / Results  Labs (all labs ordered are listed, but only abnormal results are displayed) Labs Reviewed  WET PREP, GENITAL - Abnormal; Notable for the following:       Result Value   WBC, Wet Prep HPF POC FEW (*)  All other components within normal limits  URINALYSIS, ROUTINE W REFLEX MICROSCOPIC (NOT AT [facility]) - Abnormal; Notable for the follSandy Springs Center For [facility] Surgeryowing:    APPearance TURBID (*)    Hgb urine dipstick MODERATE (*)    Protein, ur 100 (*)    Nitrite POSITIVE (*)    Leukocytes, UA LARGE (*)    All other components within normal limits  CBC WITH DIFFERENTIAL/PLATELET - Abnormal; Notable for the following:    Hemoglobin 11.3 (*)    HCT 34.5 (*)    MCV 77.2 (*)    MCH 25.3 (*)    RDW 16.1 (*)    All other components within normal limits  COMPREHENSIVE METABOLIC PANEL - Abnormal; Notable for the following:    Glucose, Bld 103 (*)    ALT 12 (*)    All other components within normal  limits  URINE MICROSCOPIC-ADD ON - Abnormal; Notable for the following:    Squamous Epithelial / LPF 0-5 (*)    Bacteria, UA MANY (*)    All other components within normal limits  URINE CULTURE  RPR  HIV ANTIBODY (ROUTINE TESTING)  POC URINE PREG, ED  GC/CHLAMYDIA PROBE AMP ([place]) [facility]    EKG  EKG Interpretation None       Radiology No results found.  Procedures Procedures (including critical care time)  Medications Ordered in ED Medications  cefTRIAXone (ROCEPHIN) injection 250 mg (250 mg Intramuscular Given 03/15/16 1901)  azithromycin (ZITHROMAX) powder 1 g (1 g Oral Given 03/15/16 1859)  phenazopyridine (PYRIDIUM) tablet 100 mg (100 mg Oral Given 03/15/16 1859)  sterile water (preservative free) injection (10 mLs  Given 03/15/16 1901)     Initial Impression / Assessment and Plan / ED Course  I have reviewed the triage vital signs and the nursing notes.  Pertinent labs & imaging results that were available during my care of the patient were reviewed by me and considered in my medical decision making (see chart for details).  Clinical Course     UTI as cause of likely symptoms. Also has intercourse unprotected, will prophylactically tx for gc/chlam. Doubt PID curently. Culture sent. Plan for dc with pcp follow up.   Final Clinical Impressions(s) / ED Diagnoses   Final diagnoses:  Lower urinary tract infectious disease    New Prescriptions New Prescriptions   CEPHALEXIN (KEFLEX) 500 MG CAPSULE    Take 1 capsule (500 mg total) by mouth 4 (four) times daily.   PHENAZOPYRIDINE (PYRIDIUM) 200 MG TABLET    Take 1 tablet (200 mg total) by mouth 3 (three) times daily as needed for pain.     Marily MemosJason Nalin Mazzocco, MD 03/15/16 1925

## 2016-03-16 ENCOUNTER — Telehealth (HOSPITAL_BASED_OUTPATIENT_CLINIC_OR_DEPARTMENT_OTHER): Payer: Self-pay

## 2016-03-16 LAB — GC/CHLAMYDIA PROBE AMP (~~LOC~~) NOT AT ARMC
CHLAMYDIA, DNA PROBE: NEGATIVE
NEISSERIA GONORRHEA: NEGATIVE

## 2016-03-16 LAB — RPR: RPR Ser Ql: NONREACTIVE

## 2016-03-16 LAB — HIV ANTIBODY (ROUTINE TESTING W REFLEX): HIV Screen 4th Generation wRfx: NONREACTIVE

## 2016-03-18 LAB — URINE CULTURE: Culture: >=100000 — AB

## 2016-03-19 ENCOUNTER — Telehealth (HOSPITAL_BASED_OUTPATIENT_CLINIC_OR_DEPARTMENT_OTHER): Payer: Self-pay | Admitting: Emergency Medicine

## 2016-03-19 NOTE — Telephone Encounter (Signed)
Post ED Visit - Positive Culture Follow-up  Culture report reviewed by antimicrobial stewardship pharmacist:  []  Samantha Hood, Pharm.D. []  Samantha Hood, Pharm.D., BCPS []  Samantha Hood, Pharm.D. []  Samantha Hood, Pharm.D., BCPS []  Samantha Hood, [address]Ph[address]D., BCPS, AAHIVP []  Samantha Hood, Pharm.D., BCPS, AAHIVP []  Samantha Hood, Pharm.D. []  Samantha Hood, [place]. Samantha Hood PharmD  Positive urine culture Treated with cephalexin, organism sensitive to the same and no further patient follow-up is required at this time.  Samantha Hood, Samantha Hood 03/19/2016, 11:02 AM

## 2016-06-28 ENCOUNTER — Ambulatory Visit: Payer: Medicaid Other

## 2016-07-05 ENCOUNTER — Encounter (HOSPITAL_COMMUNITY): Payer: Self-pay | Admitting: *Deleted

## 2016-07-05 ENCOUNTER — Inpatient Hospital Stay (HOSPITAL_COMMUNITY)
Admission: AD | Admit: 2016-07-05 | Discharge: 2016-07-05 | Disposition: A | Discharge status: Home / Self Care | Payer: Medicaid Other | Source: Ambulatory Visit | Attending: Obstetrics & Gynecology | Admitting: Obstetrics & Gynecology

## 2016-07-05 DIAGNOSIS — O26892 Other specified pregnancy related conditions, second trimester: Secondary | ICD-10-CM | POA: Diagnosis not present

## 2016-07-05 DIAGNOSIS — R102 Pelvic and perineal pain unspecified side: Secondary | ICD-10-CM

## 2016-07-05 DIAGNOSIS — O26852 Spotting complicating pregnancy, second trimester: Secondary | ICD-10-CM | POA: Diagnosis not present

## 2016-07-05 DIAGNOSIS — Z3A19 19 weeks gestation of pregnancy: Secondary | ICD-10-CM | POA: Diagnosis not present

## 2016-07-05 DIAGNOSIS — M5431 Sciatica, right side: Secondary | ICD-10-CM | POA: Diagnosis not present

## 2016-07-05 DIAGNOSIS — B9689 Other specified bacterial agents as the cause of diseases classified elsewhere: Secondary | ICD-10-CM

## 2016-07-05 DIAGNOSIS — N760 Acute vaginitis: Secondary | ICD-10-CM | POA: Insufficient documentation

## 2016-07-05 DIAGNOSIS — O23592 Infection of other part of genital tract in pregnancy, second trimester: Secondary | ICD-10-CM | POA: Insufficient documentation

## 2016-07-05 DIAGNOSIS — O0932 Supervision of pregnancy with insufficient antenatal care, second trimester: Secondary | ICD-10-CM

## 2016-07-05 DIAGNOSIS — Z6791 Unspecified blood type, Rh negative: Secondary | ICD-10-CM | POA: Insufficient documentation

## 2016-07-05 DIAGNOSIS — R109 Unspecified abdominal pain: Secondary | ICD-10-CM | POA: Diagnosis present

## 2016-07-05 DIAGNOSIS — O26899 Other specified pregnancy related conditions, unspecified trimester: Secondary | ICD-10-CM

## 2016-07-05 LAB — WET PREP, GENITAL
Sperm: NONE SEEN
Trich, Wet Prep: NONE SEEN
Yeast Wet Prep HPF POC: NONE SEEN

## 2016-07-05 LAB — URINALYSIS, ROUTINE W REFLEX MICROSCOPIC
Bilirubin Urine: NEGATIVE
Glucose, UA: NEGATIVE mg/dL
Hgb urine dipstick: NEGATIVE
Ketones, ur: NEGATIVE mg/dL
Nitrite: NEGATIVE
PH: 7 (ref 5.0–8.0)
Protein, ur: NEGATIVE mg/dL
SPECIFIC GRAVITY, URINE: 1.023 (ref 1.005–1.030)

## 2016-07-05 LAB — CBC
HEMATOCRIT: 29.4 % — AB (ref 36.0–46.0)
HEMOGLOBIN: 10.3 g/dL — AB (ref 12.0–15.0)
MCH: 27.5 pg (ref 26.0–34.0)
MCHC: 35 g/dL (ref 30.0–36.0)
MCV: 78.4 fL (ref 78.0–100.0)
Platelets: 186 10*3/uL (ref 150–400)
RBC: 3.75 MIL/uL — AB (ref 3.87–5.11)
RDW: 14.8 % (ref 11.5–15.5)
WBC: 7.1 10*3/uL (ref 4.0–10.5)

## 2016-07-05 MED ORDER — RHO D IMMUNE GLOBULIN 1500 UNIT/2ML IJ SOSY
300.0000 ug | PREFILLED_SYRINGE | Freq: Once | INTRAMUSCULAR | Status: AC
  Administered 2016-07-05: 300 ug via INTRAMUSCULAR
  Filled 2016-07-05: qty 2

## 2016-07-05 MED ORDER — METRONIDAZOLE 500 MG PO TABS: 500.0000 mg | ORAL_TABLET | Freq: Two times a day (BID) | ORAL | 0 refills | Status: AC

## 2016-07-05 MED ORDER — PRENATAL VITAMINS 28-0.8 MG PO TABS: 1.0000 | ORAL_TABLET | Freq: Every day | ORAL | 5 refills | Status: DC

## 2016-07-05 NOTE — MAU Note (Signed)
Pt repots she started having a sharp pulling  pain in her lower left pelvis last night . Hurts worse when she tries to pul her leg up. Pt reprot some spotting as well when she wiped.

## 2016-07-05 NOTE — MAU Provider Note (Signed)
Chief Complaint:  Abdominal Pain   First Provider Initiated Contact with Patient 07/05/16 1801      HPI: Samantha Hood is a 21 y.o. [id number] at 4075w6d by unsure LMP who presents to maternity admissions reporting abdominal cramping, spotting, and right leg pain x 2 weeks.  She reports an episode of spotting 2-3 weeks ago that resolved and then had spotting yesterday that resolved before today.  She reports pain in her right lower abdomen/groin area that radiates down her right leg. Her pain is worse with movement, improved with rest.  There are no associated symptom.  She is unsure of her LMP but thinks it was in September. She had a bad experience with an [facility] so was not sure who she wanted to see in this pregnancy and has not had care. She reports good fetal movement, denies LOF, vaginal bleeding, vaginal itching/burning, urinary symptoms, h/a, dizziness, n/v, or fever/chills.    HPI  Past Medical History: Past Medical History:  Diagnosis Date  . Medical history non-contributory     Past obstetric history: OB History  Gravida Para Term Preterm AB Living  3 2 2     2   SAB TAB Ectopic Multiple Live Births          2    # Outcome Date GA Lbr Len/2nd Weight Sex Delivery Anes PTL Lv  3 Current           2 Term 05/16/15 9359w0d   F CS-LTranv   LIV     Complications: Fetal distress affecting care  1 Term 12/12/12 3159w0d   M Vag-Spont  N LIV      Past Surgical History: Past Surgical History:  Procedure Laterality Date  . CESAREAN SECTION    . NO PAST SURGERIES      Family History: History reviewed. No pertinent family history.  Social History: Social History  Substance Use Topics  . Smoking status: Never Smoker  . Smokeless tobacco: Never Used  . Alcohol use No    Allergies: No Known Allergies  Meds:  Prescriptions Prior to Admission  Medication Sig Dispense Refill Last Dose  . cephALEXin (KEFLEX) 500 MG capsule Take  1 capsule (500 mg total) by mouth 4 (four) times daily. (Patient not taking: Reported on 07/05/2016) 20 capsule 0 Completed Course at Unknown time  . phenazopyridine (PYRIDIUM) 200 MG tablet Take 1 tablet (200 mg total) by mouth 3 (three) times daily as needed for pain. (Patient not taking: Reported on 07/05/2016) 10 tablet 0 Not Taking at Unknown time    ROS:  Review of Systems  Constitutional: Negative for chills, fatigue and fever.  Respiratory: Negative for shortness of breath.   Cardiovascular: Negative for chest pain.  Gastrointestinal: Positive for abdominal pain. Negative for nausea and vomiting.  Genitourinary: Positive for pelvic pain. Negative for difficulty urinating, dysuria, flank pain, vaginal bleeding, vaginal discharge and vaginal pain.  Musculoskeletal: Positive for myalgias.  Neurological: Negative for dizziness and headaches.  Psychiatric/Behavioral: Negative.      I have reviewed patient's Past Medical Hx, Surgical Hx, Family Hx, Social Hx, medications and allergies.   Physical Exam  Patient Vitals for the past 24 hrs:  BP Temp Temp src Pulse Resp Height  07/05/16 1918 (!) 112/52 - - 76 18 -  07/05/16 1706 148/92 98.7 F (37.1 C) Oral 79 18 5\' 2"  (1.575 m)   Constitutional: Well-developed, well-nourished female in no acute distress.  Cardiovascular: normal  rate Respiratory: normal effort GI: Abd soft, non-tender, gravid appropriate for gestational age.  MS: Extremities nontender, no edema, normal ROM Neurologic: Alert and oriented x 4.  GU: Neg CVAT.  PELVIC EXAM: Cervix pink, visually closed, without lesion, scant white creamy discharge, vaginal walls and external genitalia normal Bimanual exam: Cervix 0/long/high, firm, anterior, neg CMT, uterus nontender, nonenlarged, adnexa without tenderness, enlargement, or mass     FHT:  156 by doppler  Fundal height 22 cm  Labs: Results for orders placed or performed during the hospital encounter of 07/05/16 (from  the past 24 hour(s))  Urinalysis, Routine w reflex microscopic     Status: Abnormal   Collection Time: 07/05/16  5:03 PM  Result Value Ref Range   Color, Urine YELLOW YELLOW   APPearance HAZY (A) CLEAR   Specific Gravity, Urine 1.023 1.005 - 1.030   pH 7.0 5.0 - 8.0   Glucose, UA NEGATIVE NEGATIVE mg/dL   Hgb urine dipstick NEGATIVE NEGATIVE   Bilirubin Urine NEGATIVE NEGATIVE   Ketones, ur NEGATIVE NEGATIVE mg/dL   Protein, ur NEGATIVE NEGATIVE mg/dL   Nitrite NEGATIVE NEGATIVE   Leukocytes, UA MODERATE (A) NEGATIVE   RBC / HPF 0-5 0 - 5 RBC/hpf   WBC, UA 6-30 0 - 5 WBC/hpf   Bacteria, UA FEW (A) NONE SEEN   Squamous Epithelial / LPF 6-30 (A) NONE SEEN   Mucous PRESENT   CBC     Status: Abnormal   Collection Time: 07/05/16  6:15 PM  Result Value Ref Range   WBC 7.1 4.0 - 10.5 K/uL   RBC 3.75 (L) 3.87 - 5.11 MIL/uL   Hemoglobin 10.3 (L) 12.0 - 15.0 g/dL   HCT 40.9 (L) 81.1 - 91.4 %   MCV 78.4 78.0 - 100.0 fL   MCH 27.5 26.0 - 34.0 pg   MCHC 35.0 30.0 - 36.0 g/dL   RDW 78.2 95.6 - 21.3 %   Platelets 186 150 - 400 K/uL  Rh IG workup (includes ABO/Rh)     Status: None (Preliminary result)   Collection Time: 07/05/16  6:15 PM  Result Value Ref Range   Gestational Age(Wks) 24    ABO/RH(D) B NEG    Antibody Screen NEG    Fetal Screen NEG    Unit Number [id number]    Blood Component Type RHIG    Unit division 00    Status of Unit ISSUED    Transfusion Status OK TO TRANSFUSE   Wet prep, genital     Status: Abnormal   Collection Time: 07/05/16  6:30 PM  Result Value Ref Range   Yeast Wet Prep HPF POC NONE SEEN NONE SEEN   Trich, Wet Prep NONE SEEN NONE SEEN   Clue Cells Wet Prep HPF POC PRESENT (A) NONE SEEN   WBC, Wet Prep HPF POC MANY (A) NONE SEEN   Sperm NONE SEEN    --/--/B NEG (03/01 1815)  Imaging:  No results found.  MAU Course/MDM: I have ordered labs and reviewed results.  No NST, fundal height c/w [redacted] weeks gestation, FHT wnl Rhophylac given today with  recent hx of spotting and Rh neg status Pain c/w round ligament pain and sciatica of right leg, also positive clue cells so will treat BV with Flagyl.  Rest/ice/heat/Tylenol/pregnancy support belt for pain Outpatient [place] ordered and message sent to [facility] to establish care Rx for PNV Pt stable at time of discharge.   Assessment: 1. BV (bacterial vaginosis)   2. Late prenatal care  affecting pregnancy in second trimester   3. Pain of round ligament affecting pregnancy, antepartum   4. Sciatica of right side   5. [redacted] weeks gestation of pregnancy   6. Rh negative state in antepartum period, second trimester   7. Spotting affecting pregnancy in second trimester     Plan: Discharge home Preterm labor precautions and fetal kick counts  Follow-up Information    [facility].   Specialty:  Obstetrics and Gynecology Why:  The office will call you with the earliest appointment or call an Ob/Gyn provider from the list provided.  Return to MAU as needed for emergencies. Contact information: [address] [place] [address] [phone]       [facility] Follow [facility].   Specialty:  Maternal and Fetal Medicine Why:  MFM will call you to schedule your anatomy ultrasound. Contact information: [address] [id number] mc [place] [address] [phone]         Allergies as of 07/05/2016   No Known Allergies     Medication List    STOP taking these medications   cephALEXin 500 MG capsule Commonly known as:  KEFLEX   phenazopyridine 200 MG tablet Commonly known as:  PYRIDIUM     TAKE these medications   metroNIDAZOLE 500 MG tablet Commonly known as:  FLAGYL Take 1 tablet (500 mg total) by mouth 2 (two) times daily.   Prenatal Vitamins 28-0.8 MG Tabs Take 1 tablet by mouth daily.       Sharen Counter Certified Nurse-Midwife 07/05/2016 7:42 PM

## 2016-07-05 NOTE — MAU Note (Signed)
Pt presents to MAU with abdominal cramping on her lower abdomen with vaginal spotting for a week. Pt has not had any PNC, states that her menstrual cycle is irregular and she took a pregnancy test in December. [facility]. 1st vaginal and 2nd C/s due to fetal distress.

## 2016-07-05 NOTE — MAU Note (Signed)
Pt was not able to swallow foricet,

## 2016-07-06 LAB — RH IG WORKUP (INCLUDES ABO/RH)
ABO/RH(D): B NEG
Antibody Screen: NEGATIVE
FETAL SCREEN: NEGATIVE
GESTATIONAL AGE(WKS): 24
UNIT DIVISION: 0

## 2016-07-06 LAB — GC/CHLAMYDIA PROBE AMP (~~LOC~~) NOT AT ARMC
CHLAMYDIA, DNA PROBE: NEGATIVE
NEISSERIA GONORRHEA: NEGATIVE

## 2016-07-16 ENCOUNTER — Ambulatory Visit: Payer: Medicaid Other | Admitting: Medical

## 2016-07-16 ENCOUNTER — Encounter: Payer: Self-pay | Admitting: Medical

## 2016-07-16 VITALS — BP 110/67 | HR 88 | Wt 133.1 lb

## 2016-07-16 DIAGNOSIS — Z3402 Encounter for supervision of normal first pregnancy, second trimester: Secondary | ICD-10-CM

## 2016-07-16 DIAGNOSIS — Z98891 History of uterine scar from previous surgery: Secondary | ICD-10-CM | POA: Insufficient documentation

## 2016-07-16 DIAGNOSIS — O34219 Maternal care for unspecified type scar from previous cesarean delivery: Secondary | ICD-10-CM

## 2016-07-16 DIAGNOSIS — O0932 Supervision of pregnancy with insufficient antenatal care, second trimester: Secondary | ICD-10-CM

## 2016-07-16 DIAGNOSIS — Z3492 Encounter for supervision of normal pregnancy, unspecified, second trimester: Secondary | ICD-10-CM

## 2016-07-16 LAB — POCT URINALYSIS DIP (DEVICE)
Bilirubin Urine: NEGATIVE
Glucose, UA: NEGATIVE mg/dL
Ketones, ur: NEGATIVE mg/dL
Nitrite: NEGATIVE
PH: 7 (ref 5.0–8.0)
Protein, ur: 30 mg/dL — AB
SPECIFIC GRAVITY, URINE: 1.025 (ref 1.005–1.030)
UROBILINOGEN UA: 0.2 mg/dL (ref 0.0–1.0)

## 2016-07-16 NOTE — Patient Instructions (Signed)
Second Trimester of Pregnancy The second trimester is from week 13 through week 28, month 4 through 6. This is often the time in pregnancy that you feel your best. Often times, morning sickness has lessened or quit. You may have more energy, and you may get hungry more often. Your unborn baby (fetus) is growing rapidly. At the end of the sixth month, he or she is about 9 inches long and weighs about 1 pounds. You will likely feel the baby move (quickening) between 18 and 20 weeks of pregnancy. Follow these instructions at home:  Avoid all smoking, herbs, and alcohol. Avoid drugs not approved by your doctor.  Do not use any tobacco products, including cigarettes, chewing tobacco, and electronic cigarettes. If you need help quitting, ask your doctor. You may get counseling or other support to help you quit.  Only take medicine as told by your doctor. Some medicines are safe and some are not during pregnancy.  Exercise only as told by your doctor. Stop exercising if you start having cramps.  Eat regular, healthy meals.  Wear a good support bra if your breasts are tender.  Do not use hot tubs, steam rooms, or saunas.  Wear your seat belt when driving.  Avoid raw meat, uncooked cheese, and liter boxes and soil used by cats.  Take your prenatal vitamins.  Take 1500-2000 milligrams of calcium daily starting at the 20th week of pregnancy until you deliver your baby.  Try taking medicine that helps you poop (stool softener) as needed, and if your doctor approves. Eat more fiber by eating fresh fruit, vegetables, and whole grains. Drink enough fluids to keep your pee (urine) clear or pale yellow.  Take warm water baths (sitz baths) to soothe pain or discomfort caused by hemorrhoids. Use hemorrhoid cream if your doctor approves.  If you have puffy, bulging veins (varicose veins), wear support hose. Raise (elevate) your feet for 15 minutes, 3-4 times a day. Limit salt in your diet.  Avoid heavy  lifting, wear low heals, and sit up straight.  Rest with your legs raised if you have leg cramps or low back pain.  Visit your dentist if you have not gone during your pregnancy. Use a soft toothbrush to brush your teeth. Be gentle when you floss.  You can have sex (intercourse) unless your doctor tells you not to.  Go to your doctor visits. Get help if:  You feel dizzy.  You have mild cramps or pressure in your lower belly (abdomen).  You have a nagging pain in your belly area.  You continue to feel sick to your stomach (nauseous), throw up (vomit), or have watery poop (diarrhea).  You have bad smelling fluid coming from your vagina.  You have pain with peeing (urination). Get help right away if:  You have a fever.  You are leaking fluid from your vagina.  You have spotting or bleeding from your vagina.  You have severe belly cramping or pain.  You lose or gain weight rapidly.  You have trouble catching your breath and have chest pain.  You notice sudden or extreme puffiness (swelling) of your face, hands, ankles, feet, or legs.  You have not felt the baby move in over an hour.  You have severe headaches that do not go away with medicine.  You have vision changes. This information is not intended to replace advice given to you by your health care provider. Make sure you discuss any questions you have with your health care   provider. Document Released: 07/18/2009 Document Revised: 09/29/2015 Document Reviewed: 06/24/2012 Elsevier Interactive Patient Education  2017 [employer] Inc[employer]  

## 2016-07-16 NOTE — Progress Notes (Signed)
Patient left prior to being seen by the provider after receiving her US appointment.  Chart reviewed. Patient called by RN, refuses to return today for visit, will be called to reschedule.   Marny LowensteinJulie N Wenzel, PA-C 07/16/2016 8:25 AM

## 2016-07-16 NOTE — Progress Notes (Signed)
DECLINE FLU

## 2016-07-17 ENCOUNTER — Ambulatory Visit (HOSPITAL_COMMUNITY)
Admission: RE | Admit: 2016-07-17 | Discharge: 2016-07-17 | Disposition: A | Discharge status: Home / Self Care | Payer: Medicaid Other | Source: Ambulatory Visit | Attending: Advanced Practice Midwife | Admitting: Advanced Practice Midwife

## 2016-07-17 DIAGNOSIS — Z3687 Encounter for antenatal screening for uncertain dates: Secondary | ICD-10-CM | POA: Diagnosis not present

## 2016-07-17 DIAGNOSIS — Z3A19 19 weeks gestation of pregnancy: Secondary | ICD-10-CM

## 2016-07-17 DIAGNOSIS — Z3A21 21 weeks gestation of pregnancy: Secondary | ICD-10-CM | POA: Diagnosis not present

## 2016-07-17 DIAGNOSIS — O0932 Supervision of pregnancy with insufficient antenatal care, second trimester: Secondary | ICD-10-CM | POA: Insufficient documentation

## 2016-07-17 DIAGNOSIS — Z363 Encounter for antenatal screening for malformations: Secondary | ICD-10-CM | POA: Diagnosis not present

## 2016-07-19 ENCOUNTER — Encounter: Payer: Self-pay | Admitting: Obstetrics and Gynecology

## 2016-07-19 ENCOUNTER — Encounter: Payer: Medicaid Other | Admitting: Obstetrics and Gynecology

## 2016-07-19 NOTE — Progress Notes (Signed)
Patient did not keep OB appointment for 07/19/2016.  Samantha Hood, Jr MD Attending [facility] (Faculty Practice)   

## 2016-07-30 ENCOUNTER — Encounter: Payer: Medicaid Other | Admitting: Obstetrics & Gynecology

## 2016-07-30 ENCOUNTER — Telehealth: Payer: Self-pay | Admitting: Obstetrics & Gynecology

## 2016-07-30 NOTE — Telephone Encounter (Signed)
Called patient due to missed appointment. Patient stated, "it slipped my mind". Rescheduled patient appointment for 08/13/2016, patient confirmed she would be able to make appointment.

## 2016-08-13 ENCOUNTER — Encounter: Payer: Self-pay | Admitting: Advanced Practice Midwife

## 2016-08-13 ENCOUNTER — Encounter: Payer: Medicaid Other | Admitting: Advanced Practice Midwife

## 2017-03-11 ENCOUNTER — Encounter (HOSPITAL_COMMUNITY): Payer: Self-pay

## 2017-10-04 ENCOUNTER — Encounter (HOSPITAL_COMMUNITY): Payer: Self-pay | Admitting: Emergency Medicine

## 2017-10-04 ENCOUNTER — Ambulatory Visit (HOSPITAL_COMMUNITY)
Admission: EM | Admit: 2017-10-04 | Discharge: 2017-10-04 | Disposition: A | Discharge status: Home / Self Care | Payer: Self-pay | Attending: Family Medicine | Admitting: Family Medicine

## 2017-10-04 DIAGNOSIS — R109 Unspecified abdominal pain: Secondary | ICD-10-CM

## 2017-10-04 DIAGNOSIS — N938 Other specified abnormal uterine and vaginal bleeding: Secondary | ICD-10-CM

## 2017-10-04 DIAGNOSIS — Z202 Contact with and (suspected) exposure to infections with a predominantly sexual mode of transmission: Secondary | ICD-10-CM

## 2017-10-04 DIAGNOSIS — Z113 Encounter for screening for infections with a predominantly sexual mode of transmission: Secondary | ICD-10-CM

## 2017-10-04 DIAGNOSIS — Z3202 Encounter for pregnancy test, result negative: Secondary | ICD-10-CM

## 2017-10-04 DIAGNOSIS — N898 Other specified noninflammatory disorders of vagina: Secondary | ICD-10-CM | POA: Insufficient documentation

## 2017-10-04 LAB — POCT URINALYSIS DIP (DEVICE)
Bilirubin Urine: NEGATIVE
Glucose, UA: NEGATIVE mg/dL
NITRITE: NEGATIVE
PH: 7 (ref 5.0–8.0)
Protein, ur: NEGATIVE mg/dL
Specific Gravity, Urine: 1.02 (ref 1.005–1.030)
UROBILINOGEN UA: 1 mg/dL (ref 0.0–1.0)

## 2017-10-04 LAB — POCT PREGNANCY, URINE: Preg Test, Ur: NEGATIVE

## 2017-10-04 MED ORDER — AZITHROMYCIN 250 MG PO TABS
1000.0000 mg | ORAL_TABLET | Freq: Once | ORAL | Status: AC
  Administered 2017-10-04: 1000 mg via ORAL

## 2017-10-04 MED ORDER — CEFTRIAXONE SODIUM 250 MG IJ SOLR
250.0000 mg | Freq: Once | INTRAMUSCULAR | Status: AC
  Administered 2017-10-04: 250 mg via INTRAMUSCULAR

## 2017-10-04 MED ORDER — METRONIDAZOLE 500 MG PO TABS: 500.0000 mg | ORAL_TABLET | Freq: Two times a day (BID) | ORAL | 0 refills | Status: AC

## 2017-10-04 MED ORDER — CEFTRIAXONE SODIUM 250 MG IJ SOLR
INTRAMUSCULAR | Status: AC
  Filled 2017-10-04: qty 250

## 2017-10-04 MED ORDER — AZITHROMYCIN 250 MG PO TABS
ORAL_TABLET | ORAL | Status: AC
  Filled 2017-10-04: qty 4

## 2017-10-04 MED ORDER — LIDOCAINE HCL (PF) 1 % IJ SOLN
INTRAMUSCULAR | Status: AC
  Filled 2017-10-04: qty 2

## 2017-10-04 NOTE — ED Provider Notes (Signed)
[facility]    CSN: [id number] Arrival date & time: 10/04/17  1658     History   Chief Complaint Chief Complaint  Patient presents with  . Vaginal Discharge    HPI Samantha Hood is a 22 y.o. female no contributing past medical history presenting today for evaluation of vaginal discharge and bleeding with intercourse.  Patient states that for the past 1 to 2 months she has had vaginal discharge associated with itching and irritation, as well as burning.  Denies burning with urination or increased frequency.  Believes this was related to changing soaps.  Also has noted to have bleeding during and right after intercourse for 1 month, bleeding subsides after 30 minutes to 1 hour.  Denies any new partners.  Has previously not had issues with intercourse with the same partner.  States that previously when she had similar symptoms in bleeding she had chlamydia, requesting treatment for this today.  Last menstrual period was around 4/19.  Is not on any form of birth control.  HPI  Past Medical History:  Diagnosis Date  . Medical history non-contributory     Patient Active Problem List   Diagnosis Date Noted  . Supervision of low-risk pregnancy, second trimester 07/16/2016  . Previous cesarean section complicating pregnancy, antepartum condition or complication 07/16/2016  . Rubella non-immune status, antepartum 04/03/2015  . Chlamydia vaginitis/cervicitis 01/13/2015    Past Surgical History:  Procedure Laterality Date  . CESAREAN SECTION      OB History    Gravida  3   Para  2   Term  2   Preterm      AB      Living  2     SAB      TAB      Ectopic      Multiple      Live Births  2            Home Medications    Prior to Admission medications   Medication Sig Start Date End Date Taking? Authorizing Provider  metroNIDAZOLE (FLAGYL) 500 MG tablet Take 1 tablet (500 mg total) by mouth 2 (two) times daily for 7 days. 10/04/17 10/11/17  Murial Beam,  Elani Delph C, PA-C  Prenatal Vit-Fe Fumarate-FA (PRENATAL VITAMINS) 28-0.8 MG TABS Take 1 tablet by mouth daily. Patient not taking: Reported on 07/16/2016 07/05/16   Hurshel PartyLeftwich-Kirby, Lisa A, CNM    Family History No family history on file.  Social History Social History   Tobacco Use  . Smoking status: Never Smoker  . Smokeless tobacco: Never Used  Substance Use Topics  . Alcohol use: No  . Drug use: No     Allergies   Patient has no known allergies.   Review of Systems Review of Systems  Constitutional: Negative for fever.  Respiratory: Negative for shortness of breath.   Cardiovascular: Negative for chest pain.  Gastrointestinal: Positive for abdominal pain. Negative for diarrhea, nausea and vomiting.  Genitourinary: Positive for vaginal bleeding and vaginal discharge. Negative for dysuria, flank pain, frequency, genital sores, hematuria, menstrual problem and vaginal pain.  Musculoskeletal: Negative for back pain.  Skin: Negative for rash.  Neurological: Negative for dizziness, light-headedness and headaches.     Physical Exam Triage Vital Signs ED Triage Vitals [10/04/17 1728]  Enc Vitals Group     BP 130/66     Pulse Rate 70     Resp 16     Temp 98.2 F (36.8 C)  Temp src      SpO2 100 %     Weight      Height      Head Circumference      Peak Flow      Pain Score      Pain Loc      Pain Edu?      Excl. in GC?    No data found.  Updated Vital Signs BP 130/66   Pulse 70   Temp 98.2 F (36.8 C)   Resp 16   SpO2 100%   Visual Acuity Right Eye Distance:   Left Eye Distance:   Bilateral Distance:    Right Eye Near:   Left Eye Near:    Bilateral Near:     Physical Exam  Constitutional: She appears well-developed and well-nourished. No distress.  HENT:  Head: Normocephalic and atraumatic.  Eyes: Conjunctivae are normal.  Neck: Neck supple.  Cardiovascular: Normal rate and regular rhythm.  No murmur heard. Pulmonary/Chest: Effort normal and  breath sounds normal. No respiratory distress.  Abdominal: Soft. There is no tenderness.  Abdomen soft, nondistended, nontender light deep palpation throughout all 4 quadrants  Musculoskeletal: She exhibits no edema.  Neurological: She is alert.  Skin: Skin is warm and dry.  Psychiatric: She has a normal mood and affect.  Nursing note and vitals reviewed.    UC Treatments / Results  Labs (all labs ordered are listed, but only abnormal results are displayed) Labs Reviewed  POCT URINALYSIS DIP (DEVICE) - Abnormal; Notable for the following components:      Result Value   Ketones, ur TRACE (*)    Hgb urine dipstick MODERATE (*)    Leukocytes, UA SMALL (*)    All other components within normal limits  URINE CULTURE  POCT PREGNANCY, URINE  CERVICOVAGINAL ANCILLARY ONLY    EKG None  Radiology No results found.  Procedures Procedures (including critical care time)  Medications Ordered in UC Medications  cefTRIAXone (ROCEPHIN) injection 250 mg (250 mg Intramuscular Given 10/04/17 1838)  azithromycin (ZITHROMAX) tablet 1,000 mg (1,000 mg Oral Given 10/04/17 1838)    Initial Impression / Assessment and Plan / UC Course  I have reviewed the triage vital signs and the nursing notes.  Pertinent labs & imaging results that were available during my care of the patient were reviewed by me and considered in my medical decision making (see chart for details).     Pregnancy test negative, small leuks on UA, will send for culture.  Vaginal swab also obtained to check for STDs as well as yeast and BV.  Patient requesting treatment for STDs, will go ahead and treat for gonorrhea and chlamydia.  We will also treat for bacterial vaginosis as she has a history of this, oral Flagyl provided.  Advised to follow-up if bleeding does not improve with treatment.Discussed strict return precautions. Patient verbalized understanding and is agreeable with plan.  Final Clinical Impressions(s) / UC  Diagnoses   Final diagnoses:  Vaginal discharge     Discharge Instructions     We have treated you today for gonorrhea and chlamydia, with Rocephin and azithromycin. Please refrain from sexual intercourse for 7 days while medicines eliminating infection.  I have also sent in metronidazole for you to take twice daily for the next 7 days, do not consume alcohol while taking.  This will treat bacterial vaginosis.  We are testing you for Gonorrhea, Chlamydia, Trichomonas, Yeast and Bacterial Vaginosis. We will call you if anything is  positive and let you know if you require any further treatment. Please inform partners of any positive results.   Please return if bleeding not resolving with treatment.  Please return if symptoms not improving with treatment, development of fever, nausea, vomiting, abdominal pain.    ED Prescriptions    Medication Sig Dispense Auth. Provider   metroNIDAZOLE (FLAGYL) 500 MG tablet Take 1 tablet (500 mg total) by mouth 2 (two) times daily for 7 days. 14 tablet Morgaine Kimball, Mather C, PA-C     Controlled Substance Prescriptions [place] Controlled Substance Registry consulted? Not Applicable   Lew Dawes, [place] 10/04/17 1934

## 2017-10-04 NOTE — Discharge Instructions (Signed)
We have treated you today for gonorrhea and chlamydia, with Rocephin and azithromycin. Please refrain from sexual intercourse for 7 days while medicines eliminating infection.  I have also sent in metronidazole for you to take twice daily for the next 7 days, do not consume alcohol while taking.  This will treat bacterial vaginosis.  We are testing you for Gonorrhea, Chlamydia, Trichomonas, Yeast and Bacterial Vaginosis. We will call you if anything is positive and let you know if you require any further treatment. Please inform partners of any positive results.   Please return if bleeding not resolving with treatment.  Please return if symptoms not improving with treatment, development of fever, nausea, vomiting, abdominal pain.

## 2017-10-04 NOTE — ED Triage Notes (Signed)
Pt states x2 months shes had painful sex, states it bleeds after she has sex, also vaginal discharge and itching.

## 2017-10-06 LAB — URINE CULTURE: Culture: 50000 — AB

## 2017-10-07 LAB — CERVICOVAGINAL ANCILLARY ONLY
Bacterial vaginitis: POSITIVE — AB
Candida vaginitis: NEGATIVE
Chlamydia: NEGATIVE
NEISSERIA GONORRHEA: NEGATIVE
Trichomonas: POSITIVE — AB

## 2017-10-09 ENCOUNTER — Telehealth (HOSPITAL_COMMUNITY): Payer: Self-pay

## 2017-10-09 MED ORDER — NITROFURANTOIN MONOHYD MACRO 100 MG PO CAPS: 100.0000 mg | ORAL_CAPSULE | Freq: Two times a day (BID) | ORAL | 0 refills | Status: DC

## 2017-10-09 NOTE — Telephone Encounter (Signed)
Trichomonas is positive. Rx metronidazole was given at the urgent care visit. Pt contacted and made aware, educated to please refrain from sexual intercourse for 7 days to give the medicine time to work. Sexual partners need to be notified and tested/treated. Condoms may reduce risk of reinfection. Recheck for further evaluation if symptoms are not improving. Answered all questions.  Bacterial Vaginosis test is positive.  Prescription for metronidazole was given at the urgent care visit. Pt contacted regarding results. Answered all questions. Verbalized understanding.

## 2017-10-09 NOTE — Telephone Encounter (Signed)
Urine culture was positive for Staphylococcus. This was not treated at the urgent care visit. Prescription for Macrobid 100 mg bid x 5 days per Dr. Dayton ScrapeMurray sent to pharmacy of choice. Pt called and made aware. Pt educated to follow up if symptoms are not improving. Verbalized understanding

## 2017-12-22 ENCOUNTER — Inpatient Hospital Stay (HOSPITAL_COMMUNITY)
Admission: AD | Admit: 2017-12-22 | Discharge: 2017-12-22 | Disposition: A | Discharge status: Home / Self Care | Payer: Self-pay | Source: Ambulatory Visit | Attending: Obstetrics and Gynecology | Admitting: Obstetrics and Gynecology

## 2017-12-22 ENCOUNTER — Encounter (HOSPITAL_COMMUNITY): Payer: Self-pay | Admitting: *Deleted

## 2017-12-22 DIAGNOSIS — N941 Unspecified dyspareunia: Secondary | ICD-10-CM | POA: Insufficient documentation

## 2017-12-22 DIAGNOSIS — S3141XA Laceration without foreign body of vagina and vulva, initial encounter: Secondary | ICD-10-CM | POA: Insufficient documentation

## 2017-12-22 LAB — URINALYSIS, ROUTINE W REFLEX MICROSCOPIC
Bacteria, UA: NONE SEEN
Bilirubin Urine: NEGATIVE
GLUCOSE, UA: NEGATIVE mg/dL
Hgb urine dipstick: NEGATIVE
Ketones, ur: NEGATIVE mg/dL
Leukocytes, UA: NEGATIVE
Nitrite: NEGATIVE
PH: 8 (ref 5.0–8.0)
Protein, ur: 30 mg/dL — AB
Specific Gravity, Urine: 1.029 (ref 1.005–1.030)

## 2017-12-22 LAB — WET PREP, GENITAL
CLUE CELLS WET PREP: NONE SEEN
SPERM: NONE SEEN
TRICH WET PREP: NONE SEEN
YEAST WET PREP: NONE SEEN

## 2017-12-22 LAB — CBC
HEMATOCRIT: 34.9 % — AB (ref 36.0–46.0)
Hemoglobin: 11.5 g/dL — ABNORMAL LOW (ref 12.0–15.0)
MCH: 27.2 pg (ref 26.0–34.0)
MCHC: 33 g/dL (ref 30.0–36.0)
MCV: 82.5 fL (ref 78.0–100.0)
PLATELETS: 209 10*3/uL (ref 150–400)
RBC: 4.23 MIL/uL (ref 3.87–5.11)
RDW: 14.6 % (ref 11.5–15.5)
WBC: 4.9 10*3/uL (ref 4.0–10.5)

## 2017-12-22 LAB — POCT PREGNANCY, URINE: Preg Test, Ur: NEGATIVE

## 2017-12-22 MED ORDER — MEDROXYPROGESTERONE ACETATE 150 MG/ML IM SUSP
150.0000 mg | Freq: Once | INTRAMUSCULAR | Status: AC
  Administered 2017-12-22: 150 mg via INTRAMUSCULAR
  Filled 2017-12-22: qty 1

## 2017-12-22 NOTE — MAU Provider Note (Addendum)
History     CSN: [id number]  Arrival date and time: 12/22/17 2040   First Provider Initiated Contact with Patient 12/22/17 2122      Chief Complaint  Patient presents with  . Abdominal Pain   HPI Samantha Hood is a 22 y.o. [id number] post TAB 2.5 weeks ago who presents with abdominal pain and spotting after intercourse. She states her TAB was mid July and she was supposed to follow up last week but did not go. She reports intermittent cramping, but no pain now. She states she also has spotting after intercourse. She states intercourse is painful and she does not use any lubrication. Last intercourse today, and she does not use anything for birth control.   OB History    Gravida  3   Para  3   Term  3   Preterm      AB      Living  3     SAB      TAB      Ectopic      Multiple      Live Births  2           Past Medical History:  Diagnosis Date  . Medical history non-contributory     Past Surgical History:  Procedure Laterality Date  . CESAREAN SECTION      History reviewed. No pertinent family history.  Social History   Tobacco Use  . Smoking status: Never Smoker  . Smokeless tobacco: Never Used  Substance Use Topics  . Alcohol use: No  . Drug use: No    Allergies: No Known Allergies  Medications Prior to Admission  Medication Sig Dispense Refill Last Dose  . nitrofurantoin, macrocrystal-monohydrate, (MACROBID) 100 MG capsule Take 1 capsule (100 mg total) by mouth 2 (two) times daily. 10 capsule 0   . Prenatal Vit-Fe Fumarate-FA (PRENATAL VITAMINS) 28-0.8 MG TABS Take 1 tablet by mouth daily. (Patient not taking: Reported on 07/16/2016) 30 tablet 5 Not Taking    Review of Systems  Constitutional: Negative.  Negative for fatigue and fever.  HENT: Negative.   Respiratory: Negative.  Negative for shortness of breath.   Cardiovascular: Negative.  Negative for chest pain.  Gastrointestinal: Positive for abdominal pain. Negative for  constipation, diarrhea, nausea and vomiting.  Genitourinary: Positive for vaginal bleeding and vaginal pain. Negative for dysuria.  Neurological: Negative.  Negative for dizziness and headaches.   Physical Exam   Blood pressure 130/70, pulse 81, temperature 98.9 F (37.2 C), resp. rate 19, height 5' 3.5" (1.613 m), weight 60.3 kg, last menstrual period 10/22/2017, SpO2 100 %, unknown if currently breastfeeding.  Physical Exam  Nursing note and vitals reviewed. Constitutional: She is oriented to person, place, and time. She appears well-developed and well-nourished. No distress.  HENT:  Head: Normocephalic.  Eyes: Pupils are equal, round, and reactive to light.  Cardiovascular: Normal rate, regular rhythm and normal heart sounds.  Respiratory: Effort normal and breath sounds normal. No respiratory distress.  GI: Soft. Bowel sounds are normal. She exhibits no distension and no mass. There is no tenderness. There is no rebound and no guarding.  Genitourinary:     Genitourinary Comments: Pelvic exam: Cervix pink, visually closed, without lesion, scant white creamy discharge, vaginal walls and external genitalia normal Bimanual exam: Cervix 0/long/high, firm, anterior, neg CMT, uterus nontender, nonenlarged, adnexa without tenderness, enlargement, or mass  Neurological: She is alert and oriented to person, place, and time.  Skin: Skin is warm and  dry.  Psychiatric: She has a normal mood and affect. Her behavior is normal. Judgment and thought content normal.    MAU Course  Procedures Results for orders placed or performed during the hospital encounter of 12/22/17 (from the past 24 hour(s))  Urinalysis, Routine w reflex microscopic     Status: Abnormal   Collection Time: 12/22/17  9:03 PM  Result Value Ref Range   Color, Urine YELLOW YELLOW   APPearance CLEAR CLEAR   Specific Gravity, Urine 1.029 1.005 - 1.030   pH 8.0 5.0 - 8.0   Glucose, UA NEGATIVE NEGATIVE mg/dL   Hgb urine  dipstick NEGATIVE NEGATIVE   Bilirubin Urine NEGATIVE NEGATIVE   Ketones, ur NEGATIVE NEGATIVE mg/dL   Protein, ur 30 (A) NEGATIVE mg/dL   Nitrite NEGATIVE NEGATIVE   Leukocytes, UA NEGATIVE NEGATIVE   RBC / HPF 0-5 0 - 5 RBC/hpf   WBC, UA 0-5 0 - 5 WBC/hpf   Bacteria, UA NONE SEEN NONE SEEN   Squamous Epithelial / LPF 0-5 0 - 5   Mucus PRESENT   Pregnancy, urine POC     Status: None   Collection Time: 12/22/17  9:08 PM  Result Value Ref Range   Preg Test, Ur NEGATIVE NEGATIVE  Wet prep, genital     Status: Abnormal   Collection Time: 12/22/17  9:35 PM  Result Value Ref Range   Yeast Wet Prep HPF POC NONE SEEN NONE SEEN   Trich, Wet Prep NONE SEEN NONE SEEN   Clue Cells Wet Prep HPF POC NONE SEEN NONE SEEN   WBC, Wet Prep HPF POC FEW (A) NONE SEEN   Sperm NONE SEEN   CBC     Status: Abnormal   Collection Time: 12/22/17  9:42 PM  Result Value Ref Range   WBC 4.9 4.0 - 10.5 K/uL   RBC 4.23 3.87 - 5.11 MIL/uL   Hemoglobin 11.5 (L) 12.0 - 15.0 g/dL   HCT 16.134.9 (L) 09.636.0 - 04.546.0 %   MCV 82.5 78.0 - 100.0 fL   MCH 27.2 26.0 - 34.0 pg   MCHC 33.0 30.0 - 36.0 g/dL   RDW 40.914.6 81.111.5 - 91.415.5 %   Platelets 209 150 - 400 K/uL   MDM UA, UPT CBC Wet prep and gc/chlamydia Depo IM  Assessment and Plan   1. Non-obstetric vaginal laceration without foreign body or perineal laceration, initial encounter   2. Pain in female genitalia on intercourse    -Discharge home in stable condition -Encouraged patient to use lubrication with intercourse -Safe sexual practice precautions discussed -Patient advised to follow-up with OB of choice to continue Depo injections and routine care -Patient may return to MAU as needed or if her condition were to change or worsen  Rolm BookbinderCaroline M Neill CNM 12/22/2017, 9:22 PM

## 2017-12-22 NOTE — MAU Note (Signed)
Had a TAB 2.5 weeks ago.  Did not got to follow up appointment that was scheduled for 7/8.  Started having spotting with intercourse since the procedure.  Started having abdominal pain last week.  Was 3 months pregnant when she had the TAB.

## 2017-12-22 NOTE — Discharge Instructions (Signed)
Dyspareunia, Female Dyspareunia is pain that is associated with sexual activity. This can affect any part of the genitals or lower abdomen, and there are many possible causes. This condition ranges from mild to severe. Depending on the cause, dyspareunia may get better with treatment, or it may return (recur) over time. What are the causes? The cause of this condition is not always known. Possible causes include:  Cancer.  Psychological factors, such as depression, anxiety, or previous traumatic experiences.  Severe pain and tenderness of the skin around the vagina (vulva) when it is touched (vulvar vestibulitis syndrome).  Infection of the pelvis or the vulva.  Infection of the vagina.  Painful, involuntary tightening (contraction) of the vaginal muscles when anything is put inside the vagina (vaginismus).  Allergic reaction.  Ovarian cysts.  Solid growths of tissue (tumors) in the ovaries or the uterus.  Scar tissue in the ovaries, vagina, or pelvis.  Vaginal dryness.  Thinning of the tissue (atrophy) of the vulva and vagina.  Skin conditions that affect the vulva (vulvar dermatoses), such as lichen sclerosus or lichen planus.  Endometriosis.  Tubal pregnancy.  A tilted uterus.  Uterine prolapse.  Adhesions in the vagina.  Bladder problems.  Intestinal problems.  Certain medicines.  Medical conditions such as diabetes, arthritis, or thyroid disease.  What increases the risk? The following factors may make you more likely to develop this condition:  Having experienced physical or sexual trauma.  Having given birth more than once.  Taking birth control pills.  Having gone through menopause.  Having recently given birth, typically within the past 3-6 months.  Breastfeeding.  What are the signs or symptoms? The main symptom of this condition is pain in any part of the genitals or lower abdomen during or after sexual activity. This may include pain  during sexual arousal, genital stimulation, or orgasm. Pain may get worse when anything is inserted into the vagina, or when the genitals are touched in any way, such as when sitting or wearing pants. Pain can range from mild to severe, depending on the cause of the condition. In some cases, symptoms go away with treatment and return (recur) at a later date. How is this diagnosed? This condition may be diagnosed based on:  Your symptoms, including: ? Where your pain is located. ? When your pain occurs.  Your medical history.  A physical exam. This may include a pelvic exam and a Pap test. This is a screening test that is used to check for signs of cancer of the vagina, cervix, and uterus.  Tests, including: ? Blood tests. ? Ultrasound. This uses sound waves to make a picture of the area that is being tested. ? Urine culture. This test involves checking a urine sample for signs of infection. ? Culture test. This is when your health care provider uses a swab to collect a sample of vaginal fluid. The sample is checked for signs of infection. ? X-rays. ? MRI. ? CT scan. ? Laparoscopy. This is a procedure in which a small incision is made in your lower abdomen and a lighted, pencil-sized instrument (laparoscope) is passed through the incision and used to look inside your pelvis.  You may be referred to a health care provider who specializes in women's health (gynecologist). In some cases, diagnosing the cause of dyspareunia can be difficult. How is this treated? Treatment depends on the cause of your condition and your symptoms. In most cases, you may need to stop sexual activity until your symptoms   improve. Treatment may include:  Lubricants.  Kegel exercises or vaginal dilators.  Medicated skin creams.  Medicated vaginal creams.  Hormonal therapy.  Antibiotic medicine to prevent or fight infection.  Medicines that help to relieve pain.  Medicines that treat depression  (antidepressants).  Psychological counseling.  Sex therapy.  Surgery.  Follow these instructions at home: Lifestyle  Avoid tight clothing and irritating materials around your genital and abdominal area.  Use water-based lubricants as needed. Avoid oil-based lubricants.  Do not use any products that irritate you. This may include certain condoms, spermicides, lubricants, soaps, tampons, vaginal sprays, or douches.  Always practice safe sex. Talk with your health care provider about which form of birth control (contraception) is best for you.  Maintain open communication with your sexual partner. General instructions  Take over-the-counter and prescription medicines only as told by your health care provider.  If you had tests done, it is your responsibility to get your tests results. Ask your health care provider or the department performing the test when your results will be ready.  Urinate before you engage in sexual activity.  Consider joining a support group.  Keep all follow-up visits as told by your health care provider. This is important. Contact a health care provider if:  You develop vaginal bleeding after sexual intercourse.  You develop a lump at the opening of your vagina. Seek medical care even if the lump is painless.  You have: ? Abnormal vaginal discharge. ? Vaginal dryness. ? Itchiness or irritation of your vulva or vagina. ? A new rash. ? Symptoms that get worse or do not improve with treatment. ? A fever. ? Pain when you urinate. ? Blood in your urine. Get help right away if:  You develop severe pain in your abdomen during or shortly after sexual intercourse.  You pass out after having sexual intercourse. This information is not intended to replace advice given to you by your health care provider. Make sure you discuss any questions you have with your health care provider. Document Released: 05/13/2007 Document Revised: 09/02/2015 Document  Reviewed: 11/23/2014 [employer] Interactive Patient Education  2018 [employer]  

## 2017-12-22 NOTE — MAU Note (Signed)
Pt states the abortion was in mid July but she tries to forget about it. She says she missed her 4wk appt.  And wants birth control today. She says the pain in her low to mid abd gets up to a "10". But is a zero now.

## 2017-12-23 LAB — GC/CHLAMYDIA PROBE AMP (~~LOC~~) NOT AT ARMC
Chlamydia: NEGATIVE
NEISSERIA GONORRHEA: NEGATIVE

## 2018-02-17 ENCOUNTER — Telehealth: Payer: Self-pay | Admitting: General Practice

## 2018-02-17 NOTE — Telephone Encounter (Signed)
Patient called & left message on nurse voicemail line stating she is calling for test results. Called patient & informed her of normal results from 8/18. Patient verbalized understanding and had no questions.

## 2018-04-12 ENCOUNTER — Encounter (HOSPITAL_COMMUNITY): Payer: Self-pay | Admitting: *Deleted

## 2018-04-12 ENCOUNTER — Other Ambulatory Visit: Payer: Self-pay

## 2018-04-12 ENCOUNTER — Inpatient Hospital Stay (HOSPITAL_COMMUNITY)
Admission: AD | Admit: 2018-04-12 | Discharge: 2018-04-12 | Disposition: A | Discharge status: Home / Self Care | Payer: Self-pay | Source: Ambulatory Visit | Attending: Obstetrics and Gynecology | Admitting: Obstetrics and Gynecology

## 2018-04-12 DIAGNOSIS — Z3202 Encounter for pregnancy test, result negative: Secondary | ICD-10-CM | POA: Insufficient documentation

## 2018-04-12 DIAGNOSIS — N3000 Acute cystitis without hematuria: Secondary | ICD-10-CM

## 2018-04-12 DIAGNOSIS — N390 Urinary tract infection, site not specified: Secondary | ICD-10-CM | POA: Insufficient documentation

## 2018-04-12 LAB — URINALYSIS, ROUTINE W REFLEX MICROSCOPIC
Bilirubin Urine: NEGATIVE
Glucose, UA: NEGATIVE mg/dL
Hgb urine dipstick: NEGATIVE
Ketones, ur: NEGATIVE mg/dL
Nitrite: NEGATIVE
Protein, ur: NEGATIVE mg/dL
Specific Gravity, Urine: 1.028 (ref 1.005–1.030)
pH: 6 (ref 5.0–8.0)

## 2018-04-12 LAB — WET PREP, GENITAL
Clue Cells Wet Prep HPF POC: NONE SEEN
Sperm: NONE SEEN
Trich, Wet Prep: NONE SEEN
Yeast Wet Prep HPF POC: NONE SEEN

## 2018-04-12 LAB — POCT PREGNANCY, URINE: Preg Test, Ur: NEGATIVE

## 2018-04-12 MED ORDER — SULFAMETHOXAZOLE-TRIMETHOPRIM 800-160 MG PO TABS: 1.0000 | ORAL_TABLET | Freq: Two times a day (BID) | ORAL | 0 refills | Status: AC

## 2018-04-12 NOTE — MAU Note (Addendum)
Samantha Hood is a 22 y.o.  here in MAU reporting: +vaginal discharge: white, thick, no odor +lower mid abdominal pain: cramping and bloated like feeling. intermittent LMP: unknown; reports irregular periods Pain score: 1-2/10 Vitals:   04/12/18 1612  BP: 125/60  Pulse: 91  Resp: 18  Temp: 98.4 F (36.9 C)  SpO2: 100%     Patient is has also expressed that she is seeking out birth control and asked if we could give her the shot today. Lab orders placed from triage: ua and pregnancy test

## 2018-04-12 NOTE — MAU Provider Note (Signed)
History     CSN: [id number]  Arrival date and time: 04/12/18 1551   First Provider Initiated Contact with Patient 04/12/18 1640      Chief Complaint  Patient presents with  . Vaginal Discharge  . Abdominal Pain   HPI Samantha Hood is a 22 y.o. [id number] patient who presents to MAU with chief complaint of yeast infection. She reports new onset "thick clumpy discharge" within the past two days. Discharge is not foul-smelling, not itchy. Most recent sexual intercourse five days ago. She denies urinary symptoms, history of UTI, concern for STI, fever, falls, or recent illness.    Pertinent Gynecological History: Menses: flow is moderate Bleeding: N/A Contraception: none Desires Depo DES exposure: denies Blood transfusions: none Sexually transmitted diseases: history of Chlamydia. currently at risk Previous GYN Procedures: N/A  Last pap: N/A   Past Medical History:  Diagnosis Date  . Medical history non-contributory     Past Surgical History:  Procedure Laterality Date  . CESAREAN SECTION      History reviewed. No pertinent family history.  Social History   Tobacco Use  . Smoking status: Never Smoker  . Smokeless tobacco: Never Used  Substance Use Topics  . Alcohol use: No  . Drug use: No    Allergies: No Known Allergies  No medications prior to admission.    Review of Systems  Genitourinary: Positive for vaginal discharge.  All other systems reviewed and are negative.  Physical Exam   Blood pressure 125/60, pulse 91, temperature 98.4 F (36.9 C), temperature source Oral, resp. rate 18, weight 62.2 kg, SpO2 100 %, unknown if currently breastfeeding.  Physical Exam  Nursing note and vitals reviewed. Constitutional: She is oriented to person, place, and time. She appears well-developed and well-nourished.  Cardiovascular: Normal rate, normal heart sounds and intact distal pulses.  GI: Soft. She exhibits no distension. There is no tenderness. There is no  rebound, no guarding and no CVA tenderness.  Genitourinary: Vaginal discharge found.  Genitourinary Comments: Thick white vaginal discharge  Neurological: She is alert and oriented to person, place, and time.  Skin: Skin is warm and dry.  Psychiatric: She has a normal mood and affect. Her behavior is normal. Thought content normal.    MAU Course/MDM   Patient Vitals for the past 24 hrs:  BP Temp Temp src Pulse Resp SpO2 Weight  04/12/18 1729 (!) 96/59 - - 82 20 - -  04/12/18 1612 125/60 98.4 F (36.9 C) Oral 91 18 100 % 62.2 kg    Results for orders placed or performed during the hospital encounter of 04/12/18 (from the past 24 hour(s))  Urinalysis, Routine w reflex microscopic     Status: Abnormal   Collection Time: 04/12/18  4:18 PM  Result Value Ref Range   Color, Urine YELLOW YELLOW   APPearance HAZY (A) CLEAR   Specific Gravity, Urine 1.028 1.005 - 1.030   pH 6.0 5.0 - 8.0   Glucose, UA NEGATIVE NEGATIVE mg/dL   Hgb urine dipstick NEGATIVE NEGATIVE   Bilirubin Urine NEGATIVE NEGATIVE   Ketones, ur NEGATIVE NEGATIVE mg/dL   Protein, ur NEGATIVE NEGATIVE mg/dL   Nitrite NEGATIVE NEGATIVE   Leukocytes, UA LARGE (A) NEGATIVE   RBC / HPF 0-5 0 - 5 RBC/hpf   WBC, UA 6-10 0 - 5 WBC/hpf   Bacteria, UA RARE (A) NONE SEEN   Squamous Epithelial / LPF 0-5 0 - 5   Mucus PRESENT   Pregnancy, urine POC  Status: None   Collection Time: 04/12/18  4:21 PM  Result Value Ref Range   Preg Test, Ur NEGATIVE NEGATIVE  Wet prep, genital     Status: Abnormal   Collection Time: 04/12/18  4:46 PM  Result Value Ref Range   Yeast Wet Prep HPF POC NONE SEEN NONE SEEN   Trich, Wet Prep NONE SEEN NONE SEEN   Clue Cells Wet Prep HPF POC NONE SEEN NONE SEEN   WBC, Wet Prep HPF POC MANY (A) NONE SEEN   Sperm NONE SEEN     Meds ordered this encounter  Medications  . sulfamethoxazole-trimethoprim (BACTRIM DS,SEPTRA DS) 800-160 MG tablet    Sig: Take 1 tablet by mouth 2 (two) times daily for 7  days.    Dispense:  14 tablet    Refill:  0    Order Specific Question:   Supervising Provider    Answer:   Reva Bores [[id number]]   Assessment and Plan  --22 y.o. [id number]  --Negative pregnancy test --UTI, rx to pharmacy --Discharge home in stable condition  Briant Sites 04/12/2018, 6:37 PM

## 2018-04-12 NOTE — Discharge Instructions (Signed)

## 2018-04-13 ENCOUNTER — Emergency Department (HOSPITAL_COMMUNITY)
Admission: EM | Admit: 2018-04-13 | Discharge: 2018-04-13 | Disposition: A | Discharge status: Home / Self Care | Payer: No Typology Code available for payment source | Attending: Emergency Medicine | Admitting: Emergency Medicine

## 2018-04-13 ENCOUNTER — Emergency Department (HOSPITAL_COMMUNITY): Payer: No Typology Code available for payment source

## 2018-04-13 ENCOUNTER — Encounter (HOSPITAL_COMMUNITY): Payer: Self-pay | Admitting: Emergency Medicine

## 2018-04-13 ENCOUNTER — Other Ambulatory Visit: Payer: Self-pay

## 2018-04-13 DIAGNOSIS — Z23 Encounter for immunization: Secondary | ICD-10-CM | POA: Diagnosis not present

## 2018-04-13 DIAGNOSIS — Y939 Activity, unspecified: Secondary | ICD-10-CM | POA: Diagnosis not present

## 2018-04-13 DIAGNOSIS — S0993XA Unspecified injury of face, initial encounter: Secondary | ICD-10-CM

## 2018-04-13 DIAGNOSIS — S025XXA Fracture of tooth (traumatic), initial encounter for closed fracture: Secondary | ICD-10-CM | POA: Diagnosis not present

## 2018-04-13 DIAGNOSIS — S0242XB Fracture of alveolus of maxilla, initial encounter for open fracture: Secondary | ICD-10-CM | POA: Diagnosis not present

## 2018-04-13 DIAGNOSIS — Y999 Unspecified external cause status: Secondary | ICD-10-CM | POA: Insufficient documentation

## 2018-04-13 DIAGNOSIS — Y929 Unspecified place or not applicable: Secondary | ICD-10-CM | POA: Insufficient documentation

## 2018-04-13 HISTORY — DX: Other specified health status: Z78.9

## 2018-04-13 MED ORDER — MORPHINE SULFATE (PF) 4 MG/ML IV SOLN
4.0000 mg | Freq: Once | INTRAVENOUS | Status: AC
  Administered 2018-04-13: 4 mg via INTRAVENOUS
  Filled 2018-04-13: qty 1

## 2018-04-13 MED ORDER — ONDANSETRON HCL 4 MG/2ML IJ SOLN
4.0000 mg | Freq: Once | INTRAMUSCULAR | Status: AC
  Administered 2018-04-13: 4 mg via INTRAVENOUS
  Filled 2018-04-13: qty 2

## 2018-04-13 MED ORDER — TETANUS-DIPHTH-ACELL PERTUSSIS 5-2.5-18.5 LF-MCG/0.5 IM SUSP
0.5000 mL | Freq: Once | INTRAMUSCULAR | Status: AC
  Administered 2018-04-13: 0.5 mL via INTRAMUSCULAR
  Filled 2018-04-13: qty 0.5

## 2018-04-13 MED ORDER — CEPHALEXIN 500 MG PO CAPS: 500.0000 mg | ORAL_CAPSULE | Freq: Four times a day (QID) | ORAL | 0 refills | Status: DC

## 2018-04-13 MED ORDER — CHLORHEXIDINE GLUCONATE 0.12 % MT SOLN: 15.0000 mL | Freq: Two times a day (BID) | OROMUCOSAL | 0 refills | Status: DC

## 2018-04-13 MED ORDER — HYDROCODONE-ACETAMINOPHEN 7.5-325 MG/15ML PO SOLN: 10.0000 mL | ORAL | 0 refills | Status: DC | PRN

## 2018-04-13 MED ORDER — HYDROCODONE-ACETAMINOPHEN 7.5-325 MG/15ML PO SOLN
10.0000 mL | Freq: Once | ORAL | Status: AC
  Administered 2018-04-13: 10 mL via ORAL
  Filled 2018-04-13: qty 15

## 2018-04-13 MED ORDER — CHLORHEXIDINE GLUCONATE 0.12 % MT SOLN
15.0000 mL | Freq: Once | OROMUCOSAL | Status: AC
  Administered 2018-04-13: 15 mL via OROMUCOSAL
  Filled 2018-04-13: qty 15

## 2018-04-13 NOTE — ED Notes (Signed)
Patient transported to CT 

## 2018-04-13 NOTE — ED Notes (Signed)
Dr. Horton at bedside. 
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
## 2018-04-13 NOTE — ED Notes (Signed)
Pt has multiple broken teeth, L incisor is impacted into her gums.

## 2018-04-14 ENCOUNTER — Encounter (HOSPITAL_COMMUNITY): Payer: Self-pay | Admitting: Urology

## 2018-04-14 ENCOUNTER — Other Ambulatory Visit: Payer: Self-pay

## 2018-04-14 LAB — GC/CHLAMYDIA PROBE AMP (~~LOC~~) NOT AT ARMC
Chlamydia: NEGATIVE
Neisseria Gonorrhea: NEGATIVE

## 2018-04-15 ENCOUNTER — Encounter (HOSPITAL_COMMUNITY): Payer: Self-pay | Admitting: Anesthesiology

## 2018-04-15 ENCOUNTER — Ambulatory Visit (HOSPITAL_COMMUNITY)
Admission: RE | Admit: 2018-04-15 | Discharge: 2018-04-15 | Disposition: A | Discharge status: Home / Self Care | Payer: No Typology Code available for payment source | Attending: Oral Surgery | Admitting: Oral Surgery

## 2018-04-15 ENCOUNTER — Encounter (HOSPITAL_COMMUNITY)
Admission: RE | Disposition: A | Discharge status: Home / Self Care | Payer: Self-pay | Source: Home / Self Care | Attending: Oral Surgery

## 2018-04-15 ENCOUNTER — Ambulatory Visit (HOSPITAL_COMMUNITY): Payer: No Typology Code available for payment source | Admitting: Anesthesiology

## 2018-04-15 DIAGNOSIS — D649 Anemia, unspecified: Secondary | ICD-10-CM | POA: Diagnosis not present

## 2018-04-15 DIAGNOSIS — Y92410 Unspecified street and highway as the place of occurrence of the external cause: Secondary | ICD-10-CM | POA: Insufficient documentation

## 2018-04-15 DIAGNOSIS — K011 Impacted teeth: Secondary | ICD-10-CM | POA: Insufficient documentation

## 2018-04-15 DIAGNOSIS — S0242XA Fracture of alveolus of maxilla, initial encounter for closed fracture: Secondary | ICD-10-CM | POA: Diagnosis not present

## 2018-04-15 HISTORY — DX: Anemia, unspecified: D64.9

## 2018-04-15 HISTORY — PX: ORIF MANDIBULAR FRACTURE: SHX2127

## 2018-04-15 LAB — CBC
HCT: 40.5 % (ref 36.0–46.0)
HEMOGLOBIN: 12.8 g/dL (ref 12.0–15.0)
MCH: 26.8 pg (ref 26.0–34.0)
MCHC: 31.6 g/dL (ref 30.0–36.0)
MCV: 84.7 fL (ref 80.0–100.0)
Platelets: 207 10*3/uL (ref 150–400)
RBC: 4.78 MIL/uL (ref 3.87–5.11)
RDW: 13.5 % (ref 11.5–15.5)
WBC: 4.5 10*3/uL (ref 4.0–10.5)
nRBC: 0 % (ref 0.0–0.2)

## 2018-04-15 LAB — POCT PREGNANCY, URINE: Preg Test, Ur: NEGATIVE

## 2018-04-15 SURGERY — OPEN REDUCTION INTERNAL FIXATION (ORIF) MANDIBULAR FRACTURE
Anesthesia: General

## 2018-04-15 MED ORDER — MIDAZOLAM HCL 5 MG/5ML IJ SOLN
INTRAMUSCULAR | Status: DC | PRN
  Administered 2018-04-15: 2 mg via INTRAVENOUS

## 2018-04-15 MED ORDER — LACTATED RINGERS IV SOLN
Freq: Once | INTRAVENOUS | Status: AC
  Administered 2018-04-15: 08:00:00 via INTRAVENOUS

## 2018-04-15 MED ORDER — BACITRACIN ZINC 500 UNIT/GM EX OINT
TOPICAL_OINTMENT | CUTANEOUS | Status: AC
  Filled 2018-04-15: qty 28.35

## 2018-04-15 MED ORDER — LIDOCAINE 2% (20 MG/ML) 5 ML SYRINGE
INTRAMUSCULAR | Status: DC | PRN
  Administered 2018-04-15: 60 mg via INTRAVENOUS

## 2018-04-15 MED ORDER — OXYMETAZOLINE HCL 0.05 % NA SOLN
NASAL | Status: DC | PRN
  Administered 2018-04-15: 1

## 2018-04-15 MED ORDER — LIDOCAINE-EPINEPHRINE 2 %-1:100000 IJ SOLN
INTRAMUSCULAR | Status: AC
  Filled 2018-04-15: qty 1

## 2018-04-15 MED ORDER — OXYMETAZOLINE HCL 0.05 % NA SOLN
NASAL | Status: AC
  Filled 2018-04-15: qty 15

## 2018-04-15 MED ORDER — LACTATED RINGERS IV SOLN: INTRAVENOUS | Status: DC | PRN

## 2018-04-15 MED ORDER — FENTANYL CITRATE (PF) 100 MCG/2ML IJ SOLN: 25.0000 ug | INTRAMUSCULAR | Status: DC | PRN

## 2018-04-15 MED ORDER — FENTANYL CITRATE (PF) 100 MCG/2ML IJ SOLN
INTRAMUSCULAR | Status: DC | PRN
  Administered 2018-04-15: 50 ug via INTRAVENOUS
  Administered 2018-04-15: 100 ug via INTRAVENOUS

## 2018-04-15 MED ORDER — AMOXICILLIN 500 MG PO CAPS: 500.0000 mg | ORAL_CAPSULE | Freq: Three times a day (TID) | ORAL | 0 refills | Status: DC

## 2018-04-15 MED ORDER — PROPOFOL 10 MG/ML IV BOLUS
INTRAVENOUS | Status: DC | PRN
  Administered 2018-04-15: 40 mg via INTRAVENOUS
  Administered 2018-04-15: 160 mg via INTRAVENOUS

## 2018-04-15 MED ORDER — CEFAZOLIN SODIUM-DEXTROSE 2-4 GM/100ML-% IV SOLN
2.0000 g | INTRAVENOUS | Status: AC
  Administered 2018-04-15: 2 g via INTRAVENOUS
  Filled 2018-04-15: qty 100

## 2018-04-15 MED ORDER — ONDANSETRON HCL 4 MG/2ML IJ SOLN
INTRAMUSCULAR | Status: DC | PRN
  Administered 2018-04-15: 4 mg via INTRAVENOUS

## 2018-04-15 MED ORDER — LIDOCAINE-EPINEPHRINE 2 %-1:100000 IJ SOLN
INTRAMUSCULAR | Status: DC | PRN
  Administered 2018-04-15: 16 mL via INTRADERMAL

## 2018-04-15 MED ORDER — ONDANSETRON HCL 4 MG/2ML IJ SOLN: 4.0000 mg | Freq: Four times a day (QID) | INTRAMUSCULAR | Status: DC | PRN

## 2018-04-15 MED ORDER — DEXAMETHASONE SODIUM PHOSPHATE 10 MG/ML IJ SOLN
INTRAMUSCULAR | Status: DC | PRN
  Administered 2018-04-15: 10 mg via INTRAVENOUS

## 2018-04-15 MED ORDER — PROPOFOL 10 MG/ML IV BOLUS
INTRAVENOUS | Status: AC
  Filled 2018-04-15: qty 40

## 2018-04-15 MED ORDER — FENTANYL CITRATE (PF) 250 MCG/5ML IJ SOLN
INTRAMUSCULAR | Status: AC
  Filled 2018-04-15: qty 5

## 2018-04-15 MED ORDER — SUCCINYLCHOLINE CHLORIDE 20 MG/ML IJ SOLN
INTRAMUSCULAR | Status: DC | PRN
  Administered 2018-04-15: 100 mg via INTRAVENOUS

## 2018-04-15 MED ORDER — ROCURONIUM BROMIDE 50 MG/5ML IV SOSY
PREFILLED_SYRINGE | INTRAVENOUS | Status: DC | PRN
  Administered 2018-04-15: 10 mg via INTRAVENOUS
  Administered 2018-04-15: 40 mg via INTRAVENOUS

## 2018-04-15 MED ORDER — HYDROCODONE-ACETAMINOPHEN 5-325 MG PO TABS: 1.0000 | ORAL_TABLET | Freq: Four times a day (QID) | ORAL | 0 refills | Status: DC | PRN

## 2018-04-15 MED ORDER — LACTATED RINGERS IV SOLN
INTRAVENOUS | Status: DC | PRN
  Administered 2018-04-15 (×2): via INTRAVENOUS

## 2018-04-15 MED ORDER — SUGAMMADEX SODIUM 200 MG/2ML IV SOLN
INTRAVENOUS | Status: DC | PRN
  Administered 2018-04-15: 124.2 mg via INTRAVENOUS

## 2018-04-15 MED ORDER — MIDAZOLAM HCL 2 MG/2ML IJ SOLN
INTRAMUSCULAR | Status: AC
  Filled 2018-04-15: qty 2

## 2018-04-15 MED ORDER — ROCURONIUM BROMIDE 100 MG/10ML IV SOLN: INTRAVENOUS | Status: DC | PRN

## 2018-04-15 MED ORDER — OXYCODONE HCL 5 MG/5ML PO SOLN: 5.0000 mg | Freq: Once | ORAL | Status: DC | PRN

## 2018-04-15 MED ORDER — OXYCODONE HCL 5 MG PO TABS: 5.0000 mg | ORAL_TABLET | Freq: Once | ORAL | Status: DC | PRN

## 2018-04-15 SURGICAL SUPPLY — 53 items
APL SKNCLS STERI-STRIP NONHPOA (GAUZE/BANDAGES/DRESSINGS) ×1
BENZOIN TINCTURE PRP APPL 2/3 (GAUZE/BANDAGES/DRESSINGS) ×3 IMPLANT
BLADE 10 SAFETY STRL DISP (BLADE) ×3 IMPLANT
BLADE SURG 15 STRL LF DISP TIS (BLADE) ×1 IMPLANT
BLADE SURG 15 STRL SS (BLADE) ×3
BUR CROSS CUT (BURR)
BUR SRG MED 1.6XXCUT FSSR (BURR) IMPLANT
BUR SRG MED 2.1XXCUT FSSR (BURR) IMPLANT
BURR SRG MED 1.6XXCUT FSSR (BURR)
BURR SRG MED 2.1XXCUT FSSR (BURR)
CANISTER SUCT 3000ML PPV (MISCELLANEOUS) ×3 IMPLANT
CLOSURE WOUND 1/2 X4 (GAUZE/BANDAGES/DRESSINGS) ×1
COVER SURGICAL LIGHT HANDLE (MISCELLANEOUS) ×3 IMPLANT
COVER WAND RF STERILE (DRAPES) ×3 IMPLANT
DECANTER SPIKE VIAL GLASS SM (MISCELLANEOUS) ×3 IMPLANT
ELECT COATED BLADE 2.86 ST (ELECTRODE) IMPLANT
ELECT NDL BLADE 2-5/6 (NEEDLE) IMPLANT
ELECT NEEDLE BLADE 2-5/6 (NEEDLE) IMPLANT
ELECT REM PT RETURN 9FT ADLT (ELECTROSURGICAL) ×3
ELECTRODE REM PT RTRN 9FT ADLT (ELECTROSURGICAL) ×1 IMPLANT
GAUZE 4X4 16PLY RFD (DISPOSABLE) ×3 IMPLANT
GAUZE PACKING FOLDED 2  STR (GAUZE/BANDAGES/DRESSINGS) ×2
GAUZE PACKING FOLDED 2 STR (GAUZE/BANDAGES/DRESSINGS) ×1 IMPLANT
GLOVE BIO SURGEON STRL SZ 6.5 (GLOVE) ×2 IMPLANT
GLOVE BIO SURGEON STRL SZ7.5 (GLOVE) ×6 IMPLANT
GLOVE BIO SURGEONS STRL SZ 6.5 (GLOVE) ×1
GOWN STRL REUS W/ TWL LRG LVL3 (GOWN DISPOSABLE) ×2 IMPLANT
GOWN STRL REUS W/ TWL XL LVL3 (GOWN DISPOSABLE) ×1 IMPLANT
GOWN STRL REUS W/TWL LRG LVL3 (GOWN DISPOSABLE) ×6
GOWN STRL REUS W/TWL XL LVL3 (GOWN DISPOSABLE) ×3
KIT SPLINT NASAL DENVER LRG BE (GAUZE/BANDAGES/DRESSINGS) IMPLANT
KIT TURNOVER KIT B (KITS) ×3 IMPLANT
NDL BLUNT 16X1.5 OR ONLY (NEEDLE) ×2 IMPLANT
NEEDLE 22X1 1/2 (OR ONLY) (NEEDLE) ×5 IMPLANT
NEEDLE BLUNT 16X1.5 OR ONLY (NEEDLE) IMPLANT
NS IRRIG 1000ML POUR BTL (IV SOLUTION) ×3 IMPLANT
PAD ARMBOARD 7.5X6 YLW CONV (MISCELLANEOUS) ×6 IMPLANT
PATTIES SURGICAL .5 X3 (DISPOSABLE) ×3 IMPLANT
PENCIL BUTTON HOLSTER BLD 10FT (ELECTRODE) IMPLANT
SCISSORS WIRE ANG 4 3/4 DISP (INSTRUMENTS) ×3 IMPLANT
STRIP CLOSURE SKIN 1/2X4 (GAUZE/BANDAGES/DRESSINGS) ×2 IMPLANT
SUT CHROMIC 3 0 PS 2 (SUTURE) IMPLANT
SUT CHROMIC 4 0 PS 2 18 (SUTURE) ×2 IMPLANT
SUT ETHILON 5 0 P 3 18 (SUTURE)
SUT NYLON ETHILON 5-0 P-3 1X18 (SUTURE) IMPLANT
SUT STEEL 0 (SUTURE)
SUT STEEL 0 18XMFL TIE 17 (SUTURE) IMPLANT
SUT STEEL 2 (SUTURE) IMPLANT
SYR 50ML SLIP (SYRINGE) ×2 IMPLANT
TRAY ENT MC OR (CUSTOM PROCEDURE TRAY) ×3 IMPLANT
TUBING IRRIGATION (MISCELLANEOUS) IMPLANT
WATER STERILE IRR 1000ML POUR (IV SOLUTION) ×3 IMPLANT
YANKAUER SUCT BULB TIP NO VENT (SUCTIONS) ×3 IMPLANT

## 2018-04-15 NOTE — Op Note (Signed)
04/15/2018  12:08 PM  PATIENT:  Samantha LeeksAleyah J Patin  22 y.o. female  PRE-OPERATIVE DIAGNOSIS:  MAXILLARY ALVEOLAR FRACTURE, DISPLACED MAXILLARY INCISORS  POST-OPERATIVE DIAGNOSIS:  SAME  PROCEDURE:  Procedure(s):  CLOSED REDUCTION  MAXILLARY ALVEOLAR FRACTURE, REPOSITION AND STABILIZATION OF TEETH # 6, 7, 8, 9  SURGEON:  Surgeon(s): Ocie DoyneJensen, Meka Lewan, DDS  ANESTHESIA:   local and general  EBL:  minimal  DRAINS: none   SPECIMEN:  No Specimen  COUNTS:  YES  PLAN OF CARE: Discharge to home after PACU  PATIENT DISPOSITION:  PACU - hemodynamically stable.   PROCEDURE DETAILS: Dictation # [id number]  Georgia LopesScott M. Lanice Folden, DMD 04/15/2018 12:08 PM

## 2018-04-15 NOTE — Transfer of Care (Signed)
Immediate Anesthesia Transfer of Care Note  Patient: Samantha Hood  Procedure(s) Performed: OPEN REDUCTION INTERNAL FIXATION (ORIF) MANDIBULAR FRACTURE (N/A )  Patient Location: PACU  Anesthesia Type:General  Level of Consciousness: awake, alert , oriented and sedated  Airway & Oxygen Therapy: Patient Spontanous Breathing and Patient connected to face mask oxygen  Post-op Assessment: Report given to RN, Post -op Vital signs reviewed and stable and Patient moving all extremities  Post vital signs: Reviewed and stable  Last Vitals:  Vitals Value Taken Time  BP 127/72 04/15/2018 12:28 PM  Temp    Pulse 90 04/15/2018 12:31 PM  Resp 22 04/15/2018 12:31 PM  SpO2 100 % 04/15/2018 12:31 PM  Vitals shown include unvalidated device data.  Last Pain:  Vitals:   04/15/18 0736  TempSrc:   PainSc: 2       Patients Stated Pain Goal: 2 (04/15/18 0736)  Complications: No apparent anesthesia complications

## 2018-04-15 NOTE — Op Note (Signed)
NAME: Samantha HeadySNIPES, Shakiera J. MEDICAL RECORD [id number] ACCOUNT [id number] DATE OF BIRTH:1996/03/27 FACILITY: [facility] LOCATION: [facility] PHYSICIAN:Isobella Ascher M. Erienne Spelman, DDS  OPERATIVE REPORT  DATE OF PROCEDURE:  04/15/2018  PREOPERATIVE DIAGNOSIS:  Maxillary alveolar fracture, displaced maxillary incisors.  POSTOPERATIVE DIAGNOSIS:  Maxillary alveolar fracture, displaced maxillary incisors.  PROCEDURE:  Closed reduction maxillary alveolar fracture reposition, stabilize maxillary incisors 6, 7, 8, 9.   SURGEON:  Ocie DoyneScott Oluwatobi Visser, DDS  ANESTHESIA:  General nasal intubation, Dr. Chaney MallingHodierne attending.  DESCRIPTION OF PROCEDURE:  The patient was taken to the operating room and placed on the table in supine position.  General anesthesia was administered intravenously and a nasal endotracheal tube was placed and secured.  The eyes were protected and the  patient was draped for surgery.  A timeout was performed.  The posterior pharynx was suctioned and a throat pack was placed.  The maxillary fracture was then examined.  Tooth 6 was impacted below the level of the gingiva.  Teeth numbers 7, 8 and 9 were  grossly mobile and were displaced anteriorly.  The areas were suctioned and the teeth were manually repositioned numbers 7, 8 and 9 into a more anatomical position.  Tooth 6 was grasped with the rongeur and pulled inferiorly until it was in a more  satisfactory position.  The maxillary fracture was manually reduced with digital pressure to conform the surrounding bone to the tooth roots and then the teeth were stabilized using an acid etched composite.  A dental splint, which spanned from tooth 4  to tooth 11 on the buccal surface of the teeth.  Once the splint was placed, sharp areas were removed with a Stryker handpiece and then interproximal sutures were placed with 4-0 chromic gut in the anterior maxilla.  The oral cavity was then irrigated  and suctioned and a throat pack was removed.  The patient was left in  care of anesthesia for extubation and transport to recovery room with plans for discharge home through day surgery.  ESTIMATED BLOOD LOSS:  Minimal.  COMPLICATIONS:  None.  SPECIMENS:  None.  TN/NUANCE  D:04/15/2018 T:04/15/2018 JOB:[id number]

## 2018-04-15 NOTE — Anesthesia Procedure Notes (Signed)
Procedure Name: Intubation Date/Time: 04/15/2018 11:12 AM Performed by: Fransisca KaufmannMeyer, Fayrene Towner E, CRNA Pre-anesthesia Checklist: Patient identified, Emergency Drugs available, Suction available and Patient being monitored Patient Re-evaluated:Patient Re-evaluated prior to induction Oxygen Delivery Method: Circle System Utilized Preoxygenation: Pre-oxygenation with 100% oxygen Induction Type: IV induction Ventilation: Mask ventilation without difficulty Laryngoscope Size: Miller and 2 Grade View: Grade I Nasal Tubes: Nasal Rae, Nasal prep performed and Right Tube size: 7.0 mm Number of attempts: 1 Placement Confirmation: ETT inserted through vocal cords under direct vision,  positive ETCO2 and breath sounds checked- equal and bilateral Secured at: 26 cm Tube secured with: Tape Dental Injury: Teeth and Oropharynx as per pre-operative assessment
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
## 2018-04-15 NOTE — Anesthesia Preprocedure Evaluation (Signed)
Anesthesia Evaluation  Patient identified by MRN, date of birth, ID band Patient awake    Reviewed: Allergy & Precautions, H&P , NPO status , Patient's Chart, lab work & pertinent test results  Airway Mallampati: II   Neck ROM: full    Dental   Pulmonary neg pulmonary ROS,    breath sounds clear to auscultation       Cardiovascular negative cardio ROS   Rhythm:regular Rate:Normal     Neuro/Psych    GI/Hepatic   Endo/Other    Renal/GU      Musculoskeletal   Abdominal   Peds  Hematology   Anesthesia Other Findings   Reproductive/Obstetrics                             Anesthesia Physical Anesthesia Plan  ASA: I  Anesthesia Plan: General   Post-op Pain Management:    Induction: Intravenous  PONV Risk Score and Plan: 3 and Ondansetron, Dexamethasone, Midazolam and Treatment may vary due to age or medical condition  Airway Management Planned: Nasal ETT  Additional Equipment:   Intra-op Plan:   Post-operative Plan: Extubation in OR  Informed Consent: I have reviewed the patients History and Physical, chart, labs and discussed the procedure including the risks, benefits and alternatives for the proposed anesthesia with the patient or authorized representative who has indicated his/her understanding and acceptance.     Plan Discussed with: CRNA, Anesthesiologist and Surgeon  Anesthesia Plan Comments:         Anesthesia Quick Evaluation

## 2018-04-15 NOTE — Progress Notes (Signed)
Received phone from admitting stating that "patient left her juice box behind" in admitting at 7AM.  Dr. Bradley FerrisEllender aware of last clear liquids, must wait 2 hours, pt 9:30am case.

## 2018-04-16 ENCOUNTER — Encounter (HOSPITAL_COMMUNITY): Payer: Self-pay | Admitting: Oral Surgery

## 2018-04-16 NOTE — Anesthesia Postprocedure Evaluation (Signed)
Anesthesia Post Note  Patient: Charlisha J Dunnigan  Procedure(s) Performed: OPEN REDUCTION INTERNAL FIXATION (ORIF) MANDIBULAR FRACTURE (N/A )     Patient location during evaluation: PACU Anesthesia Type: General Level of consciousness: awake and alert Pain management: pain level controlled Vital Signs Assessment: post-procedure vital signs reviewed and stable Respiratory status: spontaneous breathing, nonlabored ventilation, respiratory function stable and patient connected to nasal cannula oxygen Cardiovascular status: blood pressure returned to baseline and stable Postop Assessment: no apparent nausea or vomiting Anesthetic complications: no    Last Vitals:  Vitals:   04/15/18 1400 04/15/18 1421  BP: 127/78 (!) 146/86  Pulse: 68 88  Resp: 18 17  Temp:    SpO2: 100% 100%    Last Pain:  Vitals:   04/15/18 1421  TempSrc:   PainSc: 0-No pain                 Copper Basnett S

## 2018-09-04 ENCOUNTER — Encounter: Payer: Self-pay | Admitting: *Deleted

## 2018-11-14 ENCOUNTER — Ambulatory Visit (HOSPITAL_COMMUNITY)
Admission: EM | Admit: 2018-11-14 | Discharge: 2018-11-14 | Disposition: A | Discharge status: Home / Self Care | Payer: Medicaid Other | Attending: Family Medicine | Admitting: Family Medicine

## 2018-11-14 ENCOUNTER — Encounter (HOSPITAL_COMMUNITY): Payer: Self-pay

## 2018-11-14 DIAGNOSIS — Z3201 Encounter for pregnancy test, result positive: Secondary | ICD-10-CM | POA: Diagnosis not present

## 2018-11-14 LAB — POCT URINALYSIS DIP (DEVICE)
Bilirubin Urine: NEGATIVE
Glucose, UA: NEGATIVE mg/dL
Hgb urine dipstick: NEGATIVE
Ketones, ur: NEGATIVE mg/dL
Nitrite: NEGATIVE
Protein, ur: NEGATIVE mg/dL
Specific Gravity, Urine: 1.025 (ref 1.005–1.030)
Urobilinogen, UA: 0.2 mg/dL (ref 0.0–1.0)
pH: 7 (ref 5.0–8.0)

## 2018-11-14 LAB — POCT PREGNANCY, URINE: Preg Test, Ur: POSITIVE — AB

## 2018-11-14 MED ORDER — PRENATAL COMPLETE 14-0.4 MG PO TABS: 1.0000 | ORAL_TABLET | Freq: Every day | ORAL | 0 refills | Status: DC

## 2018-11-14 NOTE — ED Triage Notes (Signed)
Pt C/O abdominal pain, she is having some cramping and nausea that started two weeks ago.

## 2018-11-14 NOTE — Discharge Instructions (Signed)
Positive pregnancy test here today.  Based on your last known period of 4/13 you are approximately [redacted] weeks pregnant. You will need to call to set up with an ob for an official dating ultrasound, however.  Please start prenatal vitamin.  Drink plenty of water.  Please go to [facility] or ER for any increased pain or vaginal bleeding.

## 2018-11-14 NOTE — ED Provider Notes (Signed)
[facility]    CSN: [id number] Arrival date & time: 11/14/18  1034      History   Chief Complaint Chief Complaint  Patient presents with  . Abdominal Pain    HPI Samantha Hood is a 23 y.o. female.   Samantha Hood presents with complaints of intermittent low abdominal cramping which has been coming and going for the past 2 weeks. Denies any current pain. Nausea. Vomited once last week or so. No constipation or diarrhea. No urinary symptoms. No vaginal symptoms. No fevers or URI symptoms. LMP was approximately 4/11. States it was heavier than usual for her. This is longer than normal for her to go without a period. 1 previous pregnancy, 23 year old sone. She is sexually active, not on birth control and doesn't use condoms. Without contributing medical history.      ROS per HPI, negative if not otherwise mentioned.      Past Medical History:  Diagnosis Date  . Anemia   . Medical history non-contributory   . Patient denies medical problems     Patient Active Problem List   Diagnosis Date Noted  . Supervision of low-risk pregnancy, second trimester 07/16/2016  . Previous cesarean section complicating pregnancy, antepartum condition or complication 01/77/9390  . Rubella non-immune status, antepartum 04/03/2015  . Chlamydia vaginitis/cervicitis 01/13/2015    Past Surgical History:  Procedure Laterality Date  . CESAREAN SECTION    . ORIF MANDIBULAR FRACTURE N/A 04/15/2018   Procedure: OPEN REDUCTION INTERNAL FIXATION (ORIF) MANDIBULAR FRACTURE;  Surgeon: Diona Browner, DDS;  Location: [facility];  Service: Oral Surgery;  Laterality: N/A;    OB History    Gravida  3   Para  3   Term  3   Preterm  0   AB  0   Living  2     SAB  0   TAB  0   Ectopic  0   Multiple      Live Births  2            Home Medications    Prior to Admission medications   Medication Sig Start Date End Date Taking? Authorizing Provider  Prenatal Vit-Fe  Fumarate-FA (PRENATAL COMPLETE) 14-0.4 MG TABS Take 1 tablet by mouth daily. 11/14/18   Zigmund Gottron, NP    Family History History reviewed. No pertinent family history.  Social History Social History   Tobacco Use  . Smoking status: Never Smoker  . Smokeless tobacco: Never Used  Substance Use Topics  . Alcohol use: No    Comment: socially   . Drug use: No     Allergies   Patient has no known allergies.   Review of Systems Review of Systems   Physical Exam Triage Vital Signs ED Triage Vitals  Enc Vitals Group     BP 11/14/18 1117 116/73     Pulse Rate 11/14/18 1117 90     Resp 11/14/18 1117 16     Temp 11/14/18 1117 99 F (37.2 C)     Temp Source 11/14/18 1117 Oral     SpO2 11/14/18 1117 99 %     Weight --      Height --      Head Circumference --      Peak Flow --      Pain Score 11/14/18 1118 5     Pain Loc --      Pain Edu? --      Excl. in [place]? --  No data found.  Updated Vital Signs BP 116/73 (BP Location: Right Arm)   Pulse 90   Temp 99 F (37.2 C) (Oral)   Resp 16   LMP 08/18/2018   SpO2 99%    Physical Exam Constitutional:      General: She is not in acute distress.    Appearance: She is well-developed.  Cardiovascular:     Rate and Rhythm: Normal rate.     Heart sounds: Normal heart sounds.  Pulmonary:     Effort: Pulmonary effort is normal.  Abdominal:     General: Bowel sounds are normal.     Palpations: Abdomen is soft.     Tenderness: There is no abdominal tenderness.  Skin:    General: Skin is warm and dry.  Neurological:     Mental Status: She is alert and oriented to person, place, and time.      UC Treatments / Results  Labs (all labs ordered are listed, but only abnormal results are displayed) Labs Reviewed  POCT URINALYSIS DIP (DEVICE) - Abnormal; Notable for the following components:      Result Value   Leukocytes,Ua TRACE (*)    All other components within normal limits  POCT PREGNANCY, URINE - Abnormal;  Notable for the following components:   Preg Test, Ur POSITIVE (*)    All other components within normal limits  URINE CULTURE  POC URINE PREG, ED    EKG   Radiology No results found.  Procedures Procedures (including critical care time)  Medications Ordered in UC Medications - No data to display  Initial Impression / Assessment and Plan / UC Course  I have reviewed the triage vital signs and the nursing notes.  Pertinent labs & imaging results that were available during my care of the patient were reviewed by me and considered in my medical decision making (see chart for details).     Positive pregnancy test which does indicate source of symptoms. Currently without symptoms. Vitals stable. No current abdominal pain. No bleeding. Prenatal started, encouraged to make initial OB appointment. Return precautions provided. Patient verbalized understanding and agreeable to plan.   Final Clinical Impressions(s) / UC Diagnoses   Final diagnoses:  Positive pregnancy test     Discharge Instructions     Positive pregnancy test here today.  Based on your last known period of 4/13 you are approximately [redacted] weeks pregnant. You will need to call to set up with an ob for an official dating ultrasound, however.  Please start prenatal vitamin.  Drink plenty of water.  Please go to womens hospital or ER for any increased pain or vaginal bleeding.    ED Prescriptions    Medication Sig Dispense Auth. Provider   Prenatal Vit-Fe Fumarate-FA (PRENATAL COMPLETE) 14-0.4 MG TABS Take 1 tablet by mouth daily. 60 tablet Georgetta HaberBurky, Benelli Winther B, NP     Controlled Substance Prescriptions [place] Controlled Substance Registry consulted? Not Applicable   Georgetta HaberBurky, Vinnie Gombert B, NP 11/14/18 1225

## 2018-11-15 LAB — URINE CULTURE: Culture: <10000 — AB

## 2019-01-29 ENCOUNTER — Telehealth: Payer: Self-pay | Admitting: Obstetrics and Gynecology

## 2019-01-29 NOTE — Telephone Encounter (Signed)
Attempted to call patient about her appointment on 9/25 @ 9:30. No answer, voicemail was left instructing patient that the visit is a telephone visit and she does not have to come to the office. Patient instructed to be available around her appointment time. Patient instructed to give the office a call back if she is needing to reschedule.

## 2019-01-30 ENCOUNTER — Other Ambulatory Visit: Payer: Self-pay

## 2019-01-30 ENCOUNTER — Ambulatory Visit (INDEPENDENT_AMBULATORY_CARE_PROVIDER_SITE_OTHER): Payer: Medicaid Other | Admitting: *Deleted

## 2019-01-30 DIAGNOSIS — O0990 Supervision of high risk pregnancy, unspecified, unspecified trimester: Secondary | ICD-10-CM | POA: Insufficient documentation

## 2019-01-30 DIAGNOSIS — O34219 Maternal care for unspecified type scar from previous cesarean delivery: Secondary | ICD-10-CM

## 2019-01-30 DIAGNOSIS — Z3492 Encounter for supervision of normal pregnancy, unspecified, second trimester: Secondary | ICD-10-CM

## 2019-01-30 DIAGNOSIS — Z3490 Encounter for supervision of normal pregnancy, unspecified, unspecified trimester: Secondary | ICD-10-CM

## 2019-01-30 MED ORDER — BLOOD PRESSURE KIT DEVI: 1.0000 | 0 refills | Status: DC | PRN

## 2019-01-30 NOTE — Progress Notes (Signed)
10:40 I called [employer] mobile and home numbers and left a message I was calling for your telephone visit- and will call again a few minutes. Jabar Krysiak,RN  I connected with  Anise Salvo Raval on 01/30/19 at  9:30 AM EDT by telephone and verified that I am speaking with the correct person using two identifiers.   I discussed the limitations, risks, security and privacy concerns of performing an evaluation and management service by telephone and the availability of in person appointments. I also discussed with the patient that there may be a patient responsible charge related to this service. The patient expressed understanding and agreed to proceed.  Explained I am completing her New OB Intake today. We discussed Her EDD and that it is based on  sure LMP . I reviewed her allergies, meds, OB History, Medical /Surgical history, and appropriate screenings. I explained we will send her [employer] app- app sent to her while on phone- she states she will download after the call.  Explained we will send a blood pressure cuff to her and the pharmacy that will send it will call her to verify her address. I asked her to bring the blood pressure cuff with her to her first ob appointment so we can show her how to use it. Explained  then we will have her take her blood pressure weekly and enter into the app. Explained she will have some visits in office and some virtually. She already has Community education officer. Reviewed appointment date/ time with her , our location and to wear mask, no visitors. Explained she will have exam, ob bloodwork, hemoglobin a1C, cbg , genetic testing if desired, pap if needed. Explained we will schedule an [place] at 19 weeks and she will get notification via [place]. She voices understanding.   Yechezkel Fertig,RN 01/30/2019  9:44 AM

## 2019-02-02 ENCOUNTER — Encounter: Payer: Self-pay | Admitting: Family Medicine

## 2019-02-02 ENCOUNTER — Encounter: Payer: Self-pay | Admitting: Obstetrics and Gynecology

## 2019-02-02 ENCOUNTER — Telehealth: Payer: Self-pay | Admitting: Family Medicine

## 2019-02-02 ENCOUNTER — Encounter: Payer: Medicaid Other | Admitting: Obstetrics and Gynecology

## 2019-02-02 NOTE — Progress Notes (Signed)
Patient seen and assessed by nursing staff during this encounter. I have reviewed the chart and agree with the documentation and plan.  Aletha Halim, MD 02/02/2019 9:24 AM

## 2019-02-02 NOTE — Telephone Encounter (Signed)
Called and left a detailed message about missed appointment today and also about her rescheduled appointment, No-Show letter for today has been sent.

## 2019-02-10 ENCOUNTER — Ambulatory Visit (HOSPITAL_COMMUNITY): Payer: No Typology Code available for payment source

## 2019-02-18 ENCOUNTER — Encounter: Payer: Self-pay | Admitting: Family Medicine

## 2019-02-18 ENCOUNTER — Encounter: Payer: Medicaid Other | Admitting: Obstetrics and Gynecology

## 2019-02-18 DIAGNOSIS — O34219 Maternal care for unspecified type scar from previous cesarean delivery: Secondary | ICD-10-CM | POA: Insufficient documentation

## 2019-02-23 ENCOUNTER — Ambulatory Visit (HOSPITAL_COMMUNITY)
Admission: RE | Admit: 2019-02-23 | Discharge: 2019-02-23 | Disposition: A | Discharge status: Home / Self Care | Payer: Medicaid Other | Source: Ambulatory Visit | Attending: Obstetrics and Gynecology | Admitting: Obstetrics and Gynecology

## 2019-02-23 ENCOUNTER — Other Ambulatory Visit (HOSPITAL_COMMUNITY): Payer: Self-pay | Admitting: *Deleted

## 2019-02-23 ENCOUNTER — Other Ambulatory Visit: Payer: Self-pay

## 2019-02-23 DIAGNOSIS — Z3492 Encounter for supervision of normal pregnancy, unspecified, second trimester: Secondary | ICD-10-CM | POA: Insufficient documentation

## 2019-02-23 DIAGNOSIS — O0932 Supervision of pregnancy with insufficient antenatal care, second trimester: Secondary | ICD-10-CM

## 2019-02-23 DIAGNOSIS — O34219 Maternal care for unspecified type scar from previous cesarean delivery: Secondary | ICD-10-CM

## 2019-02-23 DIAGNOSIS — Z3A23 23 weeks gestation of pregnancy: Secondary | ICD-10-CM

## 2019-02-23 DIAGNOSIS — Z363 Encounter for antenatal screening for malformations: Secondary | ICD-10-CM

## 2019-02-24 ENCOUNTER — Encounter: Payer: Self-pay | Admitting: Obstetrics and Gynecology

## 2019-02-24 DIAGNOSIS — O0930 Supervision of pregnancy with insufficient antenatal care, unspecified trimester: Secondary | ICD-10-CM | POA: Insufficient documentation

## 2019-03-23 ENCOUNTER — Ambulatory Visit (HOSPITAL_COMMUNITY): Payer: No Typology Code available for payment source

## 2019-03-30 ENCOUNTER — Ambulatory Visit (HOSPITAL_COMMUNITY): Payer: No Typology Code available for payment source | Attending: Obstetrics and Gynecology

## 2019-03-30 ENCOUNTER — Telehealth: Payer: Self-pay | Admitting: Student

## 2019-03-30 ENCOUNTER — Encounter: Payer: Self-pay | Admitting: Student

## 2019-03-30 ENCOUNTER — Encounter (HOSPITAL_COMMUNITY): Payer: Self-pay

## 2019-03-30 ENCOUNTER — Encounter: Payer: Medicaid Other | Admitting: Student

## 2019-03-30 NOTE — Telephone Encounter (Signed)
Attempted to contact patient to get her rescheduled for her missed new ob appointment. No answer, left voicemail for patient to give the office a call back to be rescheduled. No show letter mailed

## 2019-04-10 ENCOUNTER — Other Ambulatory Visit: Payer: Self-pay

## 2019-04-10 ENCOUNTER — Encounter (HOSPITAL_COMMUNITY): Payer: Self-pay | Admitting: *Deleted

## 2019-04-10 ENCOUNTER — Emergency Department (HOSPITAL_COMMUNITY)
Admission: EM | Admit: 2019-04-10 | Discharge: 2019-04-10 | Disposition: A | Discharge status: Home / Self Care | Payer: Medicaid Other | Attending: Emergency Medicine | Admitting: Emergency Medicine

## 2019-04-10 DIAGNOSIS — Z5321 Procedure and treatment not carried out due to patient leaving prior to being seen by health care provider: Secondary | ICD-10-CM | POA: Diagnosis not present

## 2019-04-10 DIAGNOSIS — L292 Pruritus vulvae: Secondary | ICD-10-CM | POA: Diagnosis not present

## 2019-04-10 DIAGNOSIS — N899 Noninflammatory disorder of vagina, unspecified: Secondary | ICD-10-CM | POA: Insufficient documentation

## 2019-04-10 NOTE — ED Triage Notes (Signed)
Pt reports whitish discharge and itching for about 1 week.

## 2019-04-10 NOTE — ED Notes (Signed)
Pt states she does not want to stay, wants to go to UC in the morning

## 2019-04-10 NOTE — ED Notes (Signed)
Pt called for triage but had gone to peds triage with her child.

## 2019-04-17 ENCOUNTER — Encounter: Payer: Medicaid Other | Admitting: Obstetrics and Gynecology

## 2019-04-17 ENCOUNTER — Encounter: Payer: Self-pay | Admitting: Family Medicine

## 2019-04-22 ENCOUNTER — Ambulatory Visit (HOSPITAL_COMMUNITY)
Admission: RE | Admit: 2019-04-22 | Discharge: 2019-04-22 | Disposition: A | Discharge status: Home / Self Care | Payer: Medicaid Other | Source: Ambulatory Visit | Attending: Obstetrics and Gynecology | Admitting: Obstetrics and Gynecology

## 2019-04-22 ENCOUNTER — Other Ambulatory Visit: Payer: Self-pay

## 2019-04-22 ENCOUNTER — Other Ambulatory Visit: Payer: Self-pay | Admitting: Medical

## 2019-04-22 DIAGNOSIS — Z362 Encounter for other antenatal screening follow-up: Secondary | ICD-10-CM

## 2019-04-22 DIAGNOSIS — Z3A31 31 weeks gestation of pregnancy: Secondary | ICD-10-CM | POA: Diagnosis not present

## 2019-04-22 DIAGNOSIS — O34219 Maternal care for unspecified type scar from previous cesarean delivery: Secondary | ICD-10-CM

## 2019-04-22 DIAGNOSIS — O0933 Supervision of pregnancy with insufficient antenatal care, third trimester: Secondary | ICD-10-CM

## 2019-04-22 DIAGNOSIS — N898 Other specified noninflammatory disorders of vagina: Secondary | ICD-10-CM

## 2019-04-22 DIAGNOSIS — O0932 Supervision of pregnancy with insufficient antenatal care, second trimester: Secondary | ICD-10-CM | POA: Diagnosis present

## 2019-04-22 MED ORDER — TERCONAZOLE 0.8 % VA CREA: 1.0000 | TOPICAL_CREAM | Freq: Every day | VAGINAL | 0 refills | Status: DC

## 2019-05-08 NOTE — L&D Delivery Note (Addendum)
OB/GYN Faculty Practice Delivery Note  Samantha Hood is a 24 y.o. [id number] at [redacted]w[redacted]d. She was admitted for SOL .   ROM: 2h 26m with clear fluid GBS Status: Positive, adequate ppx with Amp followed by PCN Maximum Maternal Temperature:   Labor Progress: Patient presented with CVE 6/90/0 and was expectantly managed while awaiting adequate GBS prophylaxis. After 4 hours, CVE progress to 6/80/-1 and patient was AROMed with clear fluid, IUPC and FSE placed at that time. Pitocin initiated at 1730, and discontinued at 1827 for late decelerations. Patient then progressed to complete.  Delivery Date/Time: 06/21/19 at 1859 Delivery: Called to room and patient was complete and pushing. Head delivered Direct Occiput Posterior. No nuchal cord present. Shoulder and body delivered in usual fashion. Infant with spontaneous cry, placed on mother's abdomen, dried and stimulated. Cord clamped x 2 after 1-minute delay, and cut by FOB. Cord blood drawn. Placenta delivered spontaneously with gentle cord traction, trailing membranes removed with ring forceps and manually by Cleone Slim, CNM. Fundus firm with massage and Pitocin. Labia, perineum, vagina, and cervix inspected hemostatic bilateral periurethral abrasions, and a hemostatic left vaginal wall abrasion, none of which required repair.   Placenta: 3vc, Intact except for membranes as described above, to L&D Complications: none Lacerations: none EBL: 25 mL Analgesia: epidural  Postpartum Planning [ ]  message to sent to schedule follow-up  [ ]  vaccines UTD  Infant: female  APGARs 8,9  weight pending per medical chart  , MD [facility], PGY-1 06/21/2019, 7:24 PM  I was gloved and present for delivery in its entirety.  Second stage of labor progressed, baby delivered after 4 contractions.   Complications: trailing membranes of placenta. Separated in pieces. Manual sweep by CNM removed large membranes from fundus. No  membranes or placental fragments felt after second sweep. Fundus firm, bleeding scat. Will give IV antibiotics  Lacerations: n/a  EBL: 25  [facility], CNM 7:57 PM

## 2019-05-11 ENCOUNTER — Encounter: Payer: Self-pay | Admitting: Obstetrics & Gynecology

## 2019-05-11 ENCOUNTER — Telehealth: Payer: Self-pay | Admitting: Family Medicine

## 2019-05-11 ENCOUNTER — Telehealth: Payer: Self-pay | Admitting: Obstetrics & Gynecology

## 2019-05-11 ENCOUNTER — Encounter: Payer: Medicaid Other | Admitting: Obstetrics & Gynecology

## 2019-05-11 NOTE — Telephone Encounter (Signed)
Chrissie Noa (father of the baby) called in stating he called the patient and she informed him she did not keep the appointment. He stated he would like to have the patient rescheduled however his name is not listed to discuss information in the patient's chart. No information was disclosed outside of we will reach out to the patient. The line home number is disconnected. The boyfriend called from the line ending in [phone]. The patient is also being mailed a letter to inform of missed appointment.

## 2019-05-11 NOTE — Telephone Encounter (Signed)
Attempted to contact patient to get her rescheduled for her new ob appointment. No answer, left voicemail for patient to give the office a call back to be rescheduled. No show letter mailed.

## 2019-05-21 ENCOUNTER — Other Ambulatory Visit: Payer: Self-pay

## 2019-05-21 DIAGNOSIS — N898 Other specified noninflammatory disorders of vagina: Secondary | ICD-10-CM

## 2019-05-22 ENCOUNTER — Encounter: Payer: Self-pay | Admitting: *Deleted

## 2019-05-22 MED ORDER — TERCONAZOLE 0.8 % VA CREA: 1.0000 | TOPICAL_CREAM | Freq: Every day | VAGINAL | 0 refills | Status: DC

## 2019-05-25 ENCOUNTER — Other Ambulatory Visit (HOSPITAL_COMMUNITY)
Admission: RE | Admit: 2019-05-25 | Discharge: 2019-05-25 | Disposition: A | Discharge status: Home / Self Care | Payer: Medicaid Other | Source: Ambulatory Visit | Attending: Obstetrics & Gynecology | Admitting: Obstetrics & Gynecology

## 2019-05-25 ENCOUNTER — Other Ambulatory Visit: Payer: Self-pay

## 2019-05-25 ENCOUNTER — Ambulatory Visit (INDEPENDENT_AMBULATORY_CARE_PROVIDER_SITE_OTHER): Payer: Medicaid Other | Admitting: Family Medicine

## 2019-05-25 VITALS — BP 131/84 | HR 85 | Wt 181.0 lb

## 2019-05-25 DIAGNOSIS — O360930 Maternal care for other rhesus isoimmunization, third trimester, not applicable or unspecified: Secondary | ICD-10-CM

## 2019-05-25 DIAGNOSIS — Z3492 Encounter for supervision of normal pregnancy, unspecified, second trimester: Secondary | ICD-10-CM | POA: Insufficient documentation

## 2019-05-25 DIAGNOSIS — O0993 Supervision of high risk pregnancy, unspecified, third trimester: Secondary | ICD-10-CM | POA: Diagnosis not present

## 2019-05-25 DIAGNOSIS — O34219 Maternal care for unspecified type scar from previous cesarean delivery: Secondary | ICD-10-CM

## 2019-05-25 DIAGNOSIS — O0933 Supervision of pregnancy with insufficient antenatal care, third trimester: Secondary | ICD-10-CM | POA: Insufficient documentation

## 2019-05-25 DIAGNOSIS — O0990 Supervision of high risk pregnancy, unspecified, unspecified trimester: Secondary | ICD-10-CM

## 2019-05-25 DIAGNOSIS — Z3A36 36 weeks gestation of pregnancy: Secondary | ICD-10-CM

## 2019-05-25 MED ORDER — RHO D IMMUNE GLOBULIN 1500 UNIT/2ML IJ SOSY
300.0000 ug | PREFILLED_SYRINGE | Freq: Once | INTRAMUSCULAR | Status: AC
  Administered 2019-05-25: 300 ug via INTRAMUSCULAR

## 2019-05-25 NOTE — Progress Notes (Signed)
Subjective:  Samantha Hood is a [id number] [redacted]w[redacted]d by 23 week [place], being seen today for her first obstetrical visit.  Her obstetrical history is significant for cesarean section, followed by successful VBAC, late to prenatal care.. Patient does not intend to breast feed. Pregnancy history fully reviewed.  Patient reports no complaints.  BP 131/84   Pulse 85   Wt 181 lb (82.1 kg)   LMP 08/16/2018   BMI 32.06 kg/m   HISTORY: OB History  Gravida Para Term Preterm AB Living  5 3 3  0 1 3  SAB TAB Ectopic Multiple Live Births  0 0 0   3    # Outcome Date GA Lbr Len/2nd Weight Sex Delivery Anes PTL Lv  5 Current           4 AB 2019          3 Term 11/28/16 [redacted]w[redacted]d  7 lb 10 oz (3.459 kg) M VBAC EPI  LIV     Birth Comments: no complications  2 Term 05/16/15 [redacted]w[redacted]d  5 lb 6 oz (2.438 kg) F CS-LTranv EPI  LIV     Birth Comments: no complications     Complications: Fetal distress affecting care  1 Term 12/12/12 [redacted]w[redacted]d  7 lb 1 oz (3.204 kg) M Vag-Spont EPI N LIV     Birth Comments: no complications    Past Medical History:  Diagnosis Date  . Anemia   . Chlamydia vaginitis/cervicitis 01/13/2015  . Medical history non-contributory   . Patient denies medical problems     Past Surgical History:  Procedure Laterality Date  . CESAREAN SECTION    . ORIF MANDIBULAR FRACTURE N/A 04/15/2018   Procedure: OPEN REDUCTION INTERNAL FIXATION (ORIF) MANDIBULAR FRACTURE;  Surgeon: 14/02/2018, DDS;  Location[facility];  Service: Oral Surgery;  Laterality: N/A;    No family history on file.   Exam  BP 131/84   Pulse 85   Wt 181 lb (82.1 kg)   LMP 08/16/2018   BMI 32.06 kg/m   Chaperone present during exam  CONSTITUTIONAL: Well-developed, well-nourished female in no acute distress.  HENT:  Normocephalic, atraumatic, External right and left ear normal. Oropharynx is clear and moist EYES: Conjunctivae and EOM are normal. Pupils are equal, round, and reactive to light. No scleral icterus.  NECK:  Normal range of motion, supple, no masses.  Normal thyroid.  CARDIOVASCULAR: Normal heart rate noted, regular rhythm RESPIRATORY: Clear to auscultation bilaterally. Effort and breath sounds normal, no problems with respiration noted. BREASTS: Symmetric in size. No masses, skin changes, nipple drainage, or lymphadenopathy. ABDOMEN: Soft, normal bowel sounds, no distention noted.  No tenderness, rebound or guarding.  PELVIC: Normal appearing external genitalia; normal appearing vaginal mucosa and cervix. No abnormal discharge noted. Normal uterine size, no other palpable masses, no uterine or adnexal tenderness. MUSCULOSKELETAL: Normal range of motion. No tenderness.  No cyanosis, clubbing, or edema.  2+ distal pulses. SKIN: Skin is warm and dry. No rash noted. Not diaphoretic. No erythema. No pallor. NEUROLOGIC: Alert and oriented to person, place, and time. Normal reflexes, muscle tone coordination. No cranial nerve deficit noted. PSYCHIATRIC: Normal mood and affect. Normal behavior. Normal judgment and thought content.    Assessment:    Pregnancy: 10/16/2018 Patient Active Problem List   Diagnosis Date Noted  . Insufficient prenatal care in third trimester 05/25/2019  . No prenatal care in current pregnancy 02/24/2019  . Previous cesarean section complicating pregnancy, antepartum condition or complication 02/18/2019  . Supervision of  high risk pregnancy, antepartum 01/30/2019  . History of VBAC 07/16/2016      Plan:   1. Supervision of low-risk pregnancy, second trimester FHT and FH normal. 36 week labs.  [place] normal Would like BTL - will sign PP BTL papers. - Obstetric Panel, Including HIV - Hemoglobin A1c - Culture, OB Urine - Culture, beta strep (group b only) - GC/Chlamydia probe amp ([place])not at [facility]  2. Previous cesarean section complicating pregnancy, antepartum condition or complication Desires VBAC  3. Supervision of high risk pregnancy, antepartum   4.  Insufficient prenatal care in third trimester      Problem list reviewed and updated. 75% of 30 min visit spent on counseling and coordination of care.     Truett Mainland 05/25/2019

## 2019-05-26 ENCOUNTER — Encounter: Payer: Self-pay | Admitting: *Deleted

## 2019-05-26 LAB — OBSTETRIC PANEL, INCLUDING HIV
Antibody Screen: NEGATIVE
Basophils Absolute: 0 10*3/uL (ref 0.0–0.2)
Basos: 0 %
EOS (ABSOLUTE): 0.1 10*3/uL (ref 0.0–0.4)
Eos: 1 %
HIV Screen 4th Generation wRfx: NONREACTIVE
Hematocrit: 34.5 % (ref 34.0–46.6)
Hemoglobin: 11.3 g/dL (ref 11.1–15.9)
Hepatitis B Surface Ag: NEGATIVE
Immature Grans (Abs): 0 10*3/uL (ref 0.0–0.1)
Immature Granulocytes: 1 %
Lymphocytes Absolute: 1.5 10*3/uL (ref 0.7–3.1)
Lymphs: 21 %
MCH: 25.9 pg — ABNORMAL LOW (ref 26.6–33.0)
MCHC: 32.8 g/dL (ref 31.5–35.7)
MCV: 79 fL (ref 79–97)
Monocytes Absolute: 0.6 10*3/uL (ref 0.1–0.9)
Monocytes: 8 %
Neutrophils Absolute: 4.8 10*3/uL (ref 1.4–7.0)
Neutrophils: 69 %
Platelets: 159 10*3/uL (ref 150–450)
RBC: 4.36 x10E6/uL (ref 3.77–5.28)
RDW: 13.9 % (ref 11.7–15.4)
RPR Ser Ql: NONREACTIVE
Rh Factor: NEGATIVE
Rubella Antibodies, IGG: 2.31 index (ref >0.99)
WBC: 7 10*3/uL (ref 3.4–10.8)

## 2019-05-26 LAB — HEMOGLOBIN A1C
Est. average glucose Bld gHb Est-mCnc: 108 mg/dL
Hgb A1c MFr Bld: 5.4 % (ref 4.8–5.6)

## 2019-05-26 LAB — ANTIBODY SCREEN: Antibody Screen: NEGATIVE

## 2019-05-26 LAB — GC/CHLAMYDIA PROBE AMP (~~LOC~~) NOT AT ARMC
Chlamydia: NEGATIVE
Comment: NEGATIVE
Comment: NORMAL
Neisseria Gonorrhea: NEGATIVE

## 2019-05-27 LAB — CULTURE, OB URINE

## 2019-05-27 LAB — URINE CULTURE, OB REFLEX

## 2019-05-28 ENCOUNTER — Encounter: Payer: Self-pay | Admitting: Family Medicine

## 2019-05-28 DIAGNOSIS — O99820 Streptococcus B carrier state complicating pregnancy: Secondary | ICD-10-CM | POA: Insufficient documentation

## 2019-05-28 LAB — CULTURE, BETA STREP (GROUP B ONLY): Strep Gp B Culture: POSITIVE — AB

## 2019-05-29 ENCOUNTER — Other Ambulatory Visit: Payer: Medicaid Other

## 2019-06-02 ENCOUNTER — Encounter: Payer: Medicaid Other | Admitting: Student

## 2019-06-05 ENCOUNTER — Other Ambulatory Visit: Payer: Self-pay

## 2019-06-05 DIAGNOSIS — N898 Other specified noninflammatory disorders of vagina: Secondary | ICD-10-CM

## 2019-06-05 MED ORDER — FLUCONAZOLE 150 MG PO TABS: 150.0000 mg | ORAL_TABLET | Freq: Once | ORAL | 0 refills | Status: AC

## 2019-06-06 ENCOUNTER — Other Ambulatory Visit: Payer: Self-pay

## 2019-06-06 DIAGNOSIS — N898 Other specified noninflammatory disorders of vagina: Secondary | ICD-10-CM

## 2019-06-08 ENCOUNTER — Encounter: Payer: Medicaid Other | Admitting: Advanced Practice Midwife

## 2019-06-09 ENCOUNTER — Encounter: Payer: Medicaid Other | Admitting: Student

## 2019-06-16 ENCOUNTER — Encounter: Payer: Medicaid Other | Admitting: Advanced Practice Midwife

## 2019-06-17 ENCOUNTER — Ambulatory Visit (INDEPENDENT_AMBULATORY_CARE_PROVIDER_SITE_OTHER): Payer: Medicaid Other | Admitting: Nurse Practitioner

## 2019-06-17 ENCOUNTER — Other Ambulatory Visit: Payer: Self-pay

## 2019-06-17 ENCOUNTER — Encounter: Payer: Self-pay | Admitting: Nurse Practitioner

## 2019-06-17 VITALS — BP 118/77 | HR 83 | Wt 185.5 lb

## 2019-06-17 DIAGNOSIS — Z3A39 39 weeks gestation of pregnancy: Secondary | ICD-10-CM

## 2019-06-17 DIAGNOSIS — Z3492 Encounter for supervision of normal pregnancy, unspecified, second trimester: Secondary | ICD-10-CM

## 2019-06-17 DIAGNOSIS — O0933 Supervision of pregnancy with insufficient antenatal care, third trimester: Secondary | ICD-10-CM | POA: Diagnosis present

## 2019-06-17 DIAGNOSIS — N898 Other specified noninflammatory disorders of vagina: Secondary | ICD-10-CM

## 2019-06-17 MED ORDER — TERCONAZOLE 0.8 % VA CREA: 1.0000 | TOPICAL_CREAM | Freq: Every day | VAGINAL | 0 refills | Status: DC

## 2019-06-17 NOTE — Patient Instructions (Signed)

## 2019-06-17 NOTE — Progress Notes (Signed)
    Subjective:  Samantha Hood is a 24 y.o. [id number] at [redacted]w[redacted]d being seen today for ongoing prenatal care.  She is currently monitored for the following issues for this high-risk pregnancy and has History of VBAC; Supervision of high risk pregnancy, antepartum; Previous cesarean section complicating pregnancy, antepartum condition or complication; No prenatal care in current pregnancy; Insufficient prenatal care in third trimester; and GBS (group B Streptococcus carrier), +RV culture, currently pregnant on their problem list.  Patient reports slipped on a wet sidewalk and pulled right pelvis.  Contractions: Irritability. Vag. Bleeding: None.  Movement: Present. Denies leaking of fluid.   This is client's second prenatal visit and she wants to be scheduled for induction.  Advised induction will be scheduled for 41 weeks.  She is tired in this pregnancy and is having some lower abdominal pain when walking since she slipped and pulled some muscles in her right groin.  The following portions of the patient's history were reviewed and updated as appropriate: allergies, current medications, past family history, past medical history, past social history, past surgical history and problem list. Problem list updated.  Objective:   Vitals:   06/17/19 1609  BP: 118/77  Pulse: 83  Weight: 185 lb 8 oz (84.1 kg)    Fetal Status: Fetal Heart Rate (bpm): 131 Fundal Height: 37 cm Movement: Present  Presentation: Vertex  General:  Alert, oriented and cooperative. Patient is in no acute distress.  Skin: Skin is warm and dry. No rash noted.   Cardiovascular: Normal heart rate noted  Respiratory: Normal respiratory effort, no problems with respiration noted  Abdomen: Soft, gravid, appropriate for gestational age. Pain/Pressure: Present     Pelvic:  Cervical exam performed Dilation: 1 Effacement (%): 40 Station: -3  Extremities: Normal range of motion.  Edema: None  Mental Status: Normal mood and affect.  Normal behavior. Normal judgment and thought content.  No evidence of ROM.  Still has some thick discharge.  Did not use Terazol as she states the pharmacy did not fill it.  Prescription sent again to her pharmacy. Urinalysis:      Assessment and Plan:  Pregnancy: [id number] at [redacted]w[redacted]d  1. Supervision of low-risk pregnancy, second trimester No evidence of ROM Induction scheduled at 41 weeks.  Orders entered. Wants Tubal Ligation  2. Insufficient prenatal care in third trimester Second prenatal visit today  Term labor symptoms and general obstetric precautions including but not limited to vaginal bleeding, contractions, leaking of fluid and fetal movement were reviewed in detail with the patient. Please refer to After Visit Summary for other counseling recommendations.  Return in about 6 days (around 06/23/2019) for in person ROB and NST.  Nolene Bernheim, RN, MSN, NP-BC Nurse Practitioner, [facility] for [employer], [facility] 06/17/2019 8:49 PM

## 2019-06-18 ENCOUNTER — Telehealth (HOSPITAL_COMMUNITY): Payer: Self-pay | Admitting: *Deleted

## 2019-06-18 NOTE — Telephone Encounter (Signed)
Preadmission screen  

## 2019-06-21 ENCOUNTER — Inpatient Hospital Stay (HOSPITAL_COMMUNITY)
Admission: AD | Admit: 2019-06-21 | Discharge: 2019-06-23 | DRG: 807 | Disposition: A | Discharge status: Home / Self Care | Payer: Medicaid Other | Attending: Obstetrics & Gynecology | Admitting: Obstetrics & Gynecology

## 2019-06-21 ENCOUNTER — Inpatient Hospital Stay (HOSPITAL_COMMUNITY): Payer: Medicaid Other | Admitting: Anesthesiology

## 2019-06-21 ENCOUNTER — Encounter (HOSPITAL_COMMUNITY): Payer: Self-pay | Admitting: Obstetrics & Gynecology

## 2019-06-21 ENCOUNTER — Other Ambulatory Visit: Payer: Self-pay

## 2019-06-21 DIAGNOSIS — O360930 Maternal care for other rhesus isoimmunization, third trimester, not applicable or unspecified: Secondary | ICD-10-CM | POA: Diagnosis not present

## 2019-06-21 DIAGNOSIS — O99824 Streptococcus B carrier state complicating childbirth: Secondary | ICD-10-CM

## 2019-06-21 DIAGNOSIS — Z3A40 40 weeks gestation of pregnancy: Secondary | ICD-10-CM

## 2019-06-21 DIAGNOSIS — Z6791 Unspecified blood type, Rh negative: Secondary | ICD-10-CM | POA: Diagnosis not present

## 2019-06-21 DIAGNOSIS — O0990 Supervision of high risk pregnancy, unspecified, unspecified trimester: Secondary | ICD-10-CM

## 2019-06-21 DIAGNOSIS — Z3A41 41 weeks gestation of pregnancy: Secondary | ICD-10-CM | POA: Diagnosis present

## 2019-06-21 DIAGNOSIS — Z20822 Contact with and (suspected) exposure to covid-19: Secondary | ICD-10-CM | POA: Diagnosis present

## 2019-06-21 DIAGNOSIS — O34211 Maternal care for low transverse scar from previous cesarean delivery: Secondary | ICD-10-CM | POA: Diagnosis not present

## 2019-06-21 DIAGNOSIS — O26893 Other specified pregnancy related conditions, third trimester: Secondary | ICD-10-CM | POA: Diagnosis present

## 2019-06-21 DIAGNOSIS — O480 Post-term pregnancy: Secondary | ICD-10-CM | POA: Diagnosis present

## 2019-06-21 DIAGNOSIS — O34219 Maternal care for unspecified type scar from previous cesarean delivery: Secondary | ICD-10-CM

## 2019-06-21 LAB — PROTEIN / CREATININE RATIO, URINE
Creatinine, Urine: 135.66 mg/dL
Protein Creatinine Ratio: 0.21 mg/mg{Cre} — ABNORMAL HIGH (ref 0.00–0.15)
Total Protein, Urine: 28 mg/dL

## 2019-06-21 LAB — COMPREHENSIVE METABOLIC PANEL
ALT: 13 U/L (ref 0–44)
AST: 23 U/L (ref 15–41)
Albumin: 2.7 g/dL — ABNORMAL LOW (ref 3.5–5.0)
Alkaline Phosphatase: 198 U/L — ABNORMAL HIGH (ref 38–126)
Anion gap: 11 (ref 5–15)
BUN: 8 mg/dL (ref 6–20)
CO2: 19 mmol/L — ABNORMAL LOW (ref 22–32)
Calcium: 9.1 mg/dL (ref 8.9–10.3)
Chloride: 107 mmol/L (ref 98–111)
Creatinine, Ser: 0.64 mg/dL (ref 0.44–1.00)
GFR calc Af Amer: >60 mL/min (ref >60)
GFR calc non Af Amer: >60 mL/min (ref >60)
Glucose, Bld: 88 mg/dL (ref 70–99)
Potassium: 4.2 mmol/L (ref 3.5–5.1)
Sodium: 137 mmol/L (ref 135–145)
Total Bilirubin: 0.3 mg/dL (ref 0.3–1.2)
Total Protein: 6.4 g/dL — ABNORMAL LOW (ref 6.5–8.1)

## 2019-06-21 LAB — RESPIRATORY PANEL BY RT PCR (FLU A&B, COVID)
Influenza A by PCR: NEGATIVE
Influenza B by PCR: NEGATIVE
SARS Coronavirus 2 by RT PCR: NEGATIVE

## 2019-06-21 LAB — CBC
HCT: 38.7 % (ref 36.0–46.0)
Hemoglobin: 12 g/dL (ref 12.0–15.0)
MCH: 25.5 pg — ABNORMAL LOW (ref 26.0–34.0)
MCHC: 31 g/dL (ref 30.0–36.0)
MCV: 82.2 fL (ref 80.0–100.0)
Platelets: 157 10*3/uL (ref 150–400)
RBC: 4.71 MIL/uL (ref 3.87–5.11)
RDW: 16.7 % — ABNORMAL HIGH (ref 11.5–15.5)
WBC: 10 10*3/uL (ref 4.0–10.5)
nRBC: 0 % (ref 0.0–0.2)

## 2019-06-21 MED ORDER — OXYCODONE HCL 5 MG PO TABS: 10.0000 mg | ORAL_TABLET | ORAL | Status: DC | PRN

## 2019-06-21 MED ORDER — DIPHENHYDRAMINE HCL 25 MG PO CAPS: 25.0000 mg | ORAL_CAPSULE | Freq: Four times a day (QID) | ORAL | Status: DC | PRN

## 2019-06-21 MED ORDER — LACTATED RINGERS IV SOLN: INTRAVENOUS | Status: DC

## 2019-06-21 MED ORDER — LACTATED RINGERS IV SOLN
500.0000 mL | Freq: Once | INTRAVENOUS | Status: AC
  Administered 2019-06-21: 11:00:00 500 mL via INTRAVENOUS

## 2019-06-21 MED ORDER — LIDOCAINE HCL (PF) 1 % IJ SOLN
30.0000 mL | INTRAMUSCULAR | Status: DC | PRN
  Filled 2019-06-21: qty 30

## 2019-06-21 MED ORDER — COCONUT OIL OIL: 1.0000 "application " | TOPICAL_OIL | Status: DC | PRN

## 2019-06-21 MED ORDER — PENICILLIN G POT IN DEXTROSE 60000 UNIT/ML IV SOLN
3.0000 10*6.[IU] | INTRAVENOUS | Status: DC
  Administered 2019-06-21: 3 10*6.[IU] via INTRAVENOUS
  Filled 2019-06-21: qty 50

## 2019-06-21 MED ORDER — LABETALOL HCL 5 MG/ML IV SOLN: 40.0000 mg | INTRAVENOUS | Status: DC | PRN

## 2019-06-21 MED ORDER — SENNOSIDES-DOCUSATE SODIUM 8.6-50 MG PO TABS
2.0000 | ORAL_TABLET | ORAL | Status: DC
  Administered 2019-06-22: 2 via ORAL
  Filled 2019-06-21: qty 2

## 2019-06-21 MED ORDER — DIBUCAINE (PERIANAL) 1 % EX OINT: 1.0000 "application " | TOPICAL_OINTMENT | CUTANEOUS | Status: DC | PRN

## 2019-06-21 MED ORDER — OXYTOCIN 40 UNITS IN NORMAL SALINE INFUSION - SIMPLE MED: 1.0000 m[IU]/min | INTRAVENOUS | Status: DC

## 2019-06-21 MED ORDER — LIDOCAINE HCL (PF) 1 % IJ SOLN
INTRAMUSCULAR | Status: DC | PRN
  Administered 2019-06-21 (×2): 4 mL via EPIDURAL

## 2019-06-21 MED ORDER — LABETALOL HCL 5 MG/ML IV SOLN: 20.0000 mg | INTRAVENOUS | Status: DC | PRN

## 2019-06-21 MED ORDER — FENTANYL CITRATE (PF) 100 MCG/2ML IJ SOLN: 100.0000 ug | INTRAMUSCULAR | Status: DC | PRN

## 2019-06-21 MED ORDER — CEFAZOLIN SODIUM-DEXTROSE 2-4 GM/100ML-% IV SOLN
2.0000 g | Freq: Three times a day (TID) | INTRAVENOUS | Status: DC
  Administered 2019-06-21: 22:00:00 2 g via INTRAVENOUS
  Filled 2019-06-21 (×3): qty 100

## 2019-06-21 MED ORDER — OXYCODONE-ACETAMINOPHEN 5-325 MG PO TABS: 2.0000 | ORAL_TABLET | ORAL | Status: DC | PRN

## 2019-06-21 MED ORDER — DIPHENHYDRAMINE HCL 50 MG/ML IJ SOLN: 12.5000 mg | INTRAMUSCULAR | Status: DC | PRN

## 2019-06-21 MED ORDER — PHENYLEPHRINE 40 MCG/ML (10ML) SYRINGE FOR IV PUSH (FOR BLOOD PRESSURE SUPPORT)
80.0000 ug | PREFILLED_SYRINGE | INTRAVENOUS | Status: DC | PRN
  Filled 2019-06-21: qty 10

## 2019-06-21 MED ORDER — OXYTOCIN 40 UNITS IN NORMAL SALINE INFUSION - SIMPLE MED: 2.5000 [IU]/h | INTRAVENOUS | Status: DC

## 2019-06-21 MED ORDER — LABETALOL HCL 5 MG/ML IV SOLN: 80.0000 mg | INTRAVENOUS | Status: DC | PRN

## 2019-06-21 MED ORDER — OXYCODONE-ACETAMINOPHEN 5-325 MG PO TABS: 1.0000 | ORAL_TABLET | ORAL | Status: DC | PRN

## 2019-06-21 MED ORDER — OXYCODONE HCL 5 MG PO TABS: 5.0000 mg | ORAL_TABLET | ORAL | Status: DC | PRN

## 2019-06-21 MED ORDER — BENZOCAINE-MENTHOL 20-0.5 % EX AERO: 1.0000 "application " | INHALATION_SPRAY | CUTANEOUS | Status: DC | PRN

## 2019-06-21 MED ORDER — SODIUM CHLORIDE 0.9 % IV SOLN
2.0000 g | Freq: Once | INTRAVENOUS | Status: AC
  Administered 2019-06-21: 12:00:00 2 g via INTRAVENOUS
  Filled 2019-06-21: qty 2000

## 2019-06-21 MED ORDER — FLEET ENEMA 7-19 GM/118ML RE ENEM: 1.0000 | ENEMA | RECTAL | Status: DC | PRN

## 2019-06-21 MED ORDER — OXYTOCIN BOLUS FROM INFUSION
500.0000 mL | Freq: Once | INTRAVENOUS | Status: AC
  Administered 2019-06-21: 19:00:00 500 mL via INTRAVENOUS

## 2019-06-21 MED ORDER — EPHEDRINE 5 MG/ML INJ
10.0000 mg | INTRAVENOUS | Status: DC | PRN
  Filled 2019-06-21: qty 2

## 2019-06-21 MED ORDER — ACETAMINOPHEN 325 MG PO TABS
650.0000 mg | ORAL_TABLET | ORAL | Status: DC | PRN
  Filled 2019-06-21: qty 2

## 2019-06-21 MED ORDER — SODIUM CHLORIDE 0.9 % IV SOLN
5.0000 10*6.[IU] | Freq: Once | INTRAVENOUS | Status: AC
  Administered 2019-06-21: 5 10*6.[IU] via INTRAVENOUS
  Filled 2019-06-21: qty 5

## 2019-06-21 MED ORDER — SIMETHICONE 80 MG PO CHEW: 80.0000 mg | CHEWABLE_TABLET | ORAL | Status: DC | PRN

## 2019-06-21 MED ORDER — SODIUM CHLORIDE (PF) 0.9 % IJ SOLN
INTRAMUSCULAR | Status: DC | PRN
  Administered 2019-06-21: 12 mL/h via EPIDURAL

## 2019-06-21 MED ORDER — LACTATED RINGERS IV SOLN: 500.0000 mL | INTRAVENOUS | Status: DC | PRN

## 2019-06-21 MED ORDER — IBUPROFEN 600 MG PO TABS
600.0000 mg | ORAL_TABLET | Freq: Four times a day (QID) | ORAL | Status: DC
  Administered 2019-06-22 – 2019-06-23 (×5): 600 mg via ORAL
  Filled 2019-06-21 (×6): qty 1

## 2019-06-21 MED ORDER — TERBUTALINE SULFATE 1 MG/ML IJ SOLN: 0.2500 mg | Freq: Once | INTRAMUSCULAR | Status: DC | PRN

## 2019-06-21 MED ORDER — LACTATED RINGERS IV SOLN: 500.0000 mL | Freq: Once | INTRAVENOUS | Status: DC

## 2019-06-21 MED ORDER — PRENATAL MULTIVITAMIN CH
1.0000 | ORAL_TABLET | Freq: Every day | ORAL | Status: DC
  Filled 2019-06-21 (×2): qty 1

## 2019-06-21 MED ORDER — WITCH HAZEL-GLYCERIN EX PADS: 1.0000 "application " | MEDICATED_PAD | CUTANEOUS | Status: DC | PRN

## 2019-06-21 MED ORDER — ONDANSETRON HCL 4 MG/2ML IJ SOLN: 4.0000 mg | Freq: Four times a day (QID) | INTRAMUSCULAR | Status: DC | PRN

## 2019-06-21 MED ORDER — ONDANSETRON HCL 4 MG/2ML IJ SOLN
4.0000 mg | INTRAMUSCULAR | Status: DC | PRN
  Administered 2019-06-21: 22:00:00 4 mg via INTRAVENOUS
  Filled 2019-06-21: qty 2

## 2019-06-21 MED ORDER — HYDRALAZINE HCL 20 MG/ML IJ SOLN: 10.0000 mg | INTRAMUSCULAR | Status: DC | PRN

## 2019-06-21 MED ORDER — ZOLPIDEM TARTRATE 5 MG PO TABS: 5.0000 mg | ORAL_TABLET | Freq: Every evening | ORAL | Status: DC | PRN

## 2019-06-21 MED ORDER — ONDANSETRON HCL 4 MG PO TABS: 4.0000 mg | ORAL_TABLET | ORAL | Status: DC | PRN

## 2019-06-21 MED ORDER — FENTANYL-BUPIVACAINE-NACL 0.5-0.125-0.9 MG/250ML-% EP SOLN
12.0000 mL/h | EPIDURAL | Status: DC | PRN
  Filled 2019-06-21: qty 250

## 2019-06-21 MED ORDER — ACETAMINOPHEN 325 MG PO TABS
650.0000 mg | ORAL_TABLET | ORAL | Status: DC | PRN
  Administered 2019-06-22 – 2019-06-23 (×2): 650 mg via ORAL
  Filled 2019-06-21: qty 2

## 2019-06-21 MED ORDER — OXYTOCIN 40 UNITS IN NORMAL SALINE INFUSION - SIMPLE MED
1.0000 m[IU]/min | INTRAVENOUS | Status: DC
  Administered 2019-06-21: 18:00:00 2 m[IU]/min via INTRAVENOUS
  Filled 2019-06-21: qty 1000

## 2019-06-21 MED ORDER — TERBUTALINE SULFATE 1 MG/ML IJ SOLN
0.2500 mg | Freq: Once | INTRAMUSCULAR | Status: DC | PRN
  Filled 2019-06-21: qty 1

## 2019-06-21 MED ORDER — TETANUS-DIPHTH-ACELL PERTUSSIS 5-2.5-18.5 LF-MCG/0.5 IM SUSP: 0.5000 mL | Freq: Once | INTRAMUSCULAR | Status: DC

## 2019-06-21 MED ORDER — SOD CITRATE-CITRIC ACID 500-334 MG/5ML PO SOLN: 30.0000 mL | ORAL | Status: DC | PRN

## 2019-06-21 NOTE — Anesthesia Procedure Notes (Addendum)
Epidural Patient location during procedure: OB Start time: 06/21/2019 11:42 AM End time: 06/21/2019 11:45 AM  Staffing Anesthesiologist: Kaylyn Layer, MD Performed: anesthesiologist   Preanesthetic Checklist Completed: patient identified, IV checked, risks and benefits discussed, monitors and equipment checked, pre-op evaluation and timeout performed  Epidural Patient position: sitting Prep: DuraPrep and site prepped and draped Patient monitoring: continuous pulse ox, blood pressure and heart rate Approach: midline Location: L3-L4 Injection technique: LOR air  Needle:  Needle type: Tuohy  Needle gauge: 17 G Needle length: 9 cm Needle insertion depth: 7 cm Catheter type: closed end flexible Catheter size: 19 Gauge Catheter at skin depth: 12 cm Test dose: negative and Other (1% lidocaine)  Assessment Events: blood not aspirated, injection not painful, no injection resistance, no paresthesia and negative IV test  Additional Notes Patient identified. Risks, benefits, and alternatives discussed with patient including but not limited to bleeding, infection, nerve damage, paralysis, failed block, incomplete pain control, headache, blood pressure changes, nausea, vomiting, reactions to medication, itching, and postpartum back pain. Confirmed with bedside nurse the patient's most recent platelet count. Confirmed with patient that they are not currently taking any anticoagulation, have any bleeding history, or any family history of bleeding disorders. Patient expressed understanding and wished to proceed. All questions were answered. Sterile technique was used throughout the entire procedure. Please see nursing notes for vital signs.   Crisp LOR on first pass. Test dose was given through epidural catheter and negative prior to continuing to dose epidural or start infusion. Warning signs of high block given to the patient including shortness of breath, tingling/numbness in hands, complete  motor block, or any concerning symptoms with instructions to call for help. Patient was given instructions on fall risk and not to get out of bed. All questions and concerns addressed with instructions to call with any issues or inadequate analgesia.  Reason for block:procedure for pain

## 2019-06-21 NOTE — MAU Note (Signed)
Samantha Hood is a 24 y.o. at [redacted]w[redacted]d here in MAU reporting: contractions since this morning and intermittent LOF. States OB has been made aware, no LOF today. +FM. No bleeding.   Onset of complaint: today  Pain score: 10/10  Vitals:   06/21/19 1056  BP: (!) 162/84  Pulse: (!) 103  Resp: 16  Temp: 98.5 F (36.9 C)  SpO2: 99%     Lab orders placed from triage: none

## 2019-06-21 NOTE — Anesthesia Preprocedure Evaluation (Signed)
Anesthesia Evaluation  Patient identified by MRN, date of birth, ID band Patient awake    Reviewed: Allergy & Precautions, Patient's Chart, lab work & pertinent test results  History of Anesthesia Complications Negative for: history of anesthetic complications  Airway Mallampati: II  TM Distance: >3 FB Neck ROM: Full    Dental no notable dental hx.    Pulmonary neg pulmonary ROS,    Pulmonary exam normal        Cardiovascular negative cardio ROS Normal cardiovascular exam     Neuro/Psych negative neurological ROS  negative psych ROS   GI/Hepatic negative GI ROS, Neg liver ROS,   Endo/Other  negative endocrine ROS  Renal/GU negative Renal ROS  negative genitourinary   Musculoskeletal negative musculoskeletal ROS (+)   Abdominal   Peds  Hematology negative hematology ROS (+)   Anesthesia Other Findings Day of surgery medications reviewed with patient.  Reproductive/Obstetrics (+) Pregnancy (Hx of C/S followed by VBAC)                             Anesthesia Physical Anesthesia Plan  ASA: II  Anesthesia Plan: Epidural   Post-op Pain Management:    Induction:   PONV Risk Score and Plan: Treatment may vary due to age or medical condition  Airway Management Planned: Natural Airway  Additional Equipment:   Intra-op Plan:   Post-operative Plan:   Informed Consent: I have reviewed the patients History and Physical, chart, labs and discussed the procedure including the risks, benefits and alternatives for the proposed anesthesia with the patient or authorized representative who has indicated his/her understanding and acceptance.       Plan Discussed with:   Anesthesia Plan Comments:         Anesthesia Quick Evaluation

## 2019-06-21 NOTE — H&P (Addendum)
OBSTETRIC ADMISSION HISTORY AND PHYSICAL  Samantha Hood is a 24 y.o. female [phone] with IUP at 76w1dby 23 wk [place] for SOL. She reports +FMs, No LOF, no VB, no blurry vision, headaches or peripheral edema, and RUQ pain.  She plans on bottle feeding. She requests nexplanon for birth control. Previously discussed PP BTL, papers signed 05/25/2019. [facility] D[place]: By 23 wk [place]--->  Estimated Date of Delivery: 06/20/19  Sono:    _0 , established dates, normal anatomy, cephalic presentation, anterior placental lie, 603g, 54% EFW _1 , CWD, normal anatomy, cephalic presentation, anterior placental lie, 1840g, 46%  Prenatal History/Complications: Late and insufficient PNC, [place] x1 05/25/19 GBS positive, Ampicillin ordered Rh negative, Rhogam given 05/25/19 H/O cesarean delivery, G2 d/t fetal intolerance of labor H/O successful VBAC, G3  Past Medical History: Past Medical History:  Diagnosis Date   Anemia    Chlamydia vaginitis/cervicitis 01/13/2015   Medical history non-contributory    Patient denies medical problems     Past Surgical History: Past Surgical History:  Procedure Laterality Date   CESAREAN SECTION     ORIF MANDIBULAR FRACTURE N/A 04/15/2018   Procedure: OPEN REDUCTION INTERNAL FIXATION (ORIF) MANDIBULAR FRACTURE;  Surgeon: JDiona Browner DDS;  [facility] [facility]  Service: Oral Surgery;  Laterality: N/A;    Obstetrical History: OB History     Gravida  5   Para  3   Term  3   Preterm  0   AB  1   Living  3      SAB  0   TAB  0   Ectopic  0   Multiple      Live Births  3           Social History: Social History   Socioeconomic History   Marital status: Single    Spouse name: Not on file   Number of children: Not on file   Years of education: Not on file   Highest education level: Not on file  Occupational History   Not on file  Tobacco Use   Smoking status: Never Smoker   Smokeless  tobacco: Never Used  Substance and Sexual Activity   Alcohol use: Not Currently    Comment: socially    Drug use: No   Sexual activity: Yes    Birth control/protection: None  Other Topics Concern   Not on file  Social History Narrative   ** Merged History Encounter **       Social Determinants of Health   Financial Resource Strain:    Difficulty of Paying Living Expenses: Not on file  Food Insecurity: No Food Insecurity   Worried About RCharity fundraiserin the Last Year: Never true   R[place]in the Last Year: Never true  Transportation Needs: No Transportation Needs   Lack of Transportation (Medical): No   Lack of Transportation (Non-Medical): No  Physical Activity:    Days of Exercise per Week: Not on file   Minutes of Exercise per Session: Not on file  Stress:    Feeling of Stress : Not on file  Social Connections:    Frequency of Communication with Friends and Family: Not on file   Frequency of Social Gatherings with Friends and Family: Not on file   Attends Religious Services: Not on file   Active Member of Clubs or Organizations: Not on file   Attends CArchivistMeetings: Not  on file   Marital Status: Not on file    Family History: History reviewed. No pertinent family history.  Allergies: No Known Allergies  Medications Prior to Admission  Medication Sig Dispense Refill Last Dose   Blood Pressure Monitoring (BLOOD PRESSURE KIT) DEVI 1 Device by Does not apply route as needed. ICD 10: Z34.90 (Patient not taking: Reported on 05/25/2019) 1 Device 0    Prenatal Vit-Fe Fumarate-FA (PRENATAL COMPLETE) 14-0.4 MG TABS Take 1 tablet by mouth daily. (Patient not taking: Reported on 01/30/2019) 60 tablet 0    terconazole (TERAZOL 3) 0.8 % vaginal cream Place 1 applicator vaginally at bedtime. 20 g 0      Review of Systems   All systems reviewed and negative except as stated in HPI  Blood pressure (!) 117/98, pulse (!) 110, temperature 98.5 F (36.9  C), temperature source Oral, resp. rate 16, last menstrual period 08/16/2018, SpO2 99 %, unknown if currently breastfeeding. General appearance: alert, cooperative, appears stated age and no distress Lungs: normal effort Heart: regular rate  Abdomen: soft, non-tender; bowel sounds normal Pelvic: gravid uterus GU: No vaginal lesions  Extremities: Homans sign is negative, no sign of DVT DTR's intact Presentation: cephalic and by exam Fetal monitoringBaseline: 125 bpm, Variability: Good {> 6 bpm), Accelerations: Reactive and Decelerations: Absent Uterine activity: Date/time of onset: 06/21/19 and Frequency: Every 2-4 minutes Dilation: 6 Effacement (%): 90 Station: 0 Exam by:: smith cnm   Prenatal labs: ABO, Rh: B/Negative/-- (01/18 1652) Antibody: Negative (01/18 1655) Rubella: 2.31 (01/18 1652) RPR: Non Reactive (01/18 1652)  HBsAg: Negative (01/18 1652)  HIV: Non Reactive (01/18 1652)  GBS: Positive/-- (01/18 1641)  2 hr Glucola Hgb A1c 05/26/19 5.4% Genetic screening  Too late Anatomy US WNL  Prenatal Transfer Tool  Maternal Diabetes: No Genetic Screening: Declined Maternal Ultrasounds/Referrals: Normal Fetal Ultrasounds or other Referrals:  None Maternal Substance Abuse:  No Significant Maternal Medications:  None Significant Maternal Lab Results: Group B Strep positive and Rh negative  No results found for this or any previous visit (from the past 24 hour(s)).  Patient Active Problem List   Diagnosis Date Noted   Post term pregnancy at [redacted] weeks gestation 06/21/2019   GBS (group B Streptococcus carrier), +RV culture, currently pregnant 05/28/2019   Insufficient prenatal care in third trimester 05/25/2019   No prenatal care in current pregnancy 02/24/2019   Previous cesarean section complicating pregnancy, antepartum condition or complication 26/20/3559   Supervision of high risk pregnancy, antepartum 01/30/2019   History of VBAC 07/16/2016    Assessment/Plan:   Samantha Hood is a 24 y.o. [id number] at 32w1dhere for SOL.  #Labor: Patient presented in spontaneous labor. Will expectantly manage and attempt to reach adequacy of GBS prophylaxis. OB admit labs pending. #Rh negative: Rhogam 05/25/19. T&S pending. Will perform rhogam eval after delivery. #Pain: Epidural #FWB: Cat I; EFW: 3200g #ID:  GBS pos, Amp ordered, PCN to follow #MOF: bottle #MOC: Nexplanon vs depo, paperwork for BTL signed 05/25/19 #Circ:  n/a  CGladys Damme MD CWillardResidency, PGY-1 06/21/2019 at 11:11 AM   I confirm that I have verified the information documented in the resident's note and that I have also personally reperformed the history, physical exam and all medical decision making activities of this service and have verified that all service and findings are accurately documented in this student's note.   NWende Mott CNorth Dakota2/14/2021 1:10 PM

## 2019-06-21 NOTE — Progress Notes (Signed)
Labor Progress Note Samantha Hood is a 24 y.o. [phone] at [redacted]w[redacted]d presented for SOL S: patient more comfortable with epidural in place  O:  BP (!) 126/57   Pulse 62   Temp 98.7 F (37.1 C) (Axillary)   Resp 18   Ht 5\' 3"  (1.6 m)   Wt 83.9 kg   LMP 08/16/2018   SpO2 100%   BMI 32.77 kg/m  EFM: 115 bpm/+accels, moderate variability/1 variable  CVE: Dilation: 7 Effacement (%): 90 Station: 0 Presentation: Vertex Exam by:: beitz, rn   A&P: 24 y.o. 30 [redacted]w[redacted]d here for SOL  #Labor: Progressing well. Patient GBS positive, ppx will be adequate. Plan to AROM after abx adequacy reached. #Pain: epidural #FWB: Cat I #GBS positive   [redacted]w[redacted]d, MD 3:07 PM

## 2019-06-21 NOTE — Progress Notes (Signed)
CNM at bedside for FHR deceleration. Cervix 6/80/-1. Discussed with patient risks and benefits of AROM for augmentation of labor. Patient agreeable to plan of care. AROM with large amount of clear fluid. Patient and FHR tolerated procedure well. Discussed risks and benefits of IUPC and FSE placement for monitoring contractions and fetal heart rate more accurately. Patient agreeable to plan of care. Internal monitors placed without difficulty and patient tolerated procedure well.   Rolm Bookbinder, CNM 06/21/19 4:50 PM

## 2019-06-22 ENCOUNTER — Encounter: Payer: Self-pay | Admitting: General Practice

## 2019-06-22 ENCOUNTER — Other Ambulatory Visit: Payer: Self-pay | Admitting: General Practice

## 2019-06-22 ENCOUNTER — Encounter: Payer: Self-pay | Admitting: *Deleted

## 2019-06-22 LAB — RPR: RPR Ser Ql: NONREACTIVE

## 2019-06-22 MED ORDER — RHO D IMMUNE GLOBULIN 1500 UNIT/2ML IJ SOSY
300.0000 ug | PREFILLED_SYRINGE | Freq: Once | INTRAMUSCULAR | Status: AC
  Administered 2019-06-22: 300 ug via INTRAMUSCULAR
  Filled 2019-06-22: qty 2

## 2019-06-22 NOTE — Clinical Social Work Maternal (Signed)
CLINICAL SOCIAL WORK MATERNAL/CHILD NOTE  Patient Details  Name: Samantha Hood MRN: [id number] Date of Birth: 04-13-1996  Date:  06/22/2019  Clinical Social Worker Initiating Note:  Elijio Miles Date/Time: Initiated:  06/22/19/1442     Child's Name:  Samantha Hood   Biological Parents:  Mother, Father(Samantha Hood and Samantha Hood DOB: 04/13/1995)   Need for Interpreter:  None   Reason for Referral:  Late or No Prenatal Care    Address:  [place], [place] [address]    Phone number:  [phone] (home)     Additional phone number:   Household Members/Support Persons (HM/SP):   Household Member/Support Person 1, Household Member/Support Person 2, Household Member/Support Person 3   HM/SP Name Relationship DOB or Age  HM/SP -1 Samantha Hood 12/12/2012  HM/SP -2 Samantha Hood Daughter 05/16/2015  HM/SP -3 Samantha Hood 11/28/2016  HM/SP -4        HM/SP -5        HM/SP -6        HM/SP -7        HM/SP -8          Natural Supports (not living in the home):  Immediate Family   Professional Supports: None   Employment: Unemployed   Type of Work:     Education:  Programmer, systems   Homebound arranged:    Museum/gallery curator Resources:  [employer]   Other Resources:  Physicist, medical (Needs to renew [employer] - given contact information)   Cultural/Religious Considerations Which May Impact Care:    Strengths:  Ability to meet basic needs , Home prepared for child , Pediatrician chosen   Psychotropic Medications:         Pediatrician:    [place]  Pediatrician List:   [facility] ([address])  [place]      Pediatrician Fax Number:    Risk Factors/Current Problems:  Transportation    Cognitive State:  Able to Concentrate , Alert , Linear Thinking    Mood/Affect:  Calm , Comfortable , Interested  , Relaxed    CSW Assessment:  CSW received consult for limited and late prenatal care. CSW met with MOB to offer support and complete assessment.    MOB resting in bed with FOB present at bedside and holding infant, when CSW entered the room. CSW introduced self and requested that FOB step out of the room so that CSW could meet with MOB in private. MOB stated she was ok with FOB staying in the room while assessment was being completed and gave CSW verbal permission to discuss anything in front of FOB. MOB reported she currently lives in [place] with her 3 children. MOB confirmed she receives food stamps and intends to renew [facility] and was provided with [facility] information. CSW inquired about MOB's mental health history and MOB denied having any and denied any previous PPD/A. CSW provided education regarding the baby blues period vs. perinatal mood disorders. CSW recommended self-evaluation during the postpartum time period using the New Mom Checklist from Postpartum Progress and encouraged MOB to contact a medical professional if symptoms are noted at any time. MOB did not appear to be displaying any acute mental health symptoms and denied any current SI or HI. MOB reported having good support from her sister and her brother. MOB confirmed  having all essential items for infant once discharged and stated infant would be sleeping in a bassinet once home. CSW provided review of Sudden Infant Death Syndrome (SIDS) precautions.    CSW inquired about MOB's reasoning for her limited/late prenatal care to which MOB stated she had to keep rescheduling her appointments due to transportation and not feeling good. MOB denied any barriers to getting infant to his follow up appointments. CSW informed MOB of Hospital Drug Policy and explained UDS and CDS were still pending but that a CPS report would be made, if warranted. MOB denied any substance use during pregnancy and denied any previous CPS involvement.   CSW to  continue to monitor UDS and CDS results and make CPS report, if warranted.    CSW Plan/Description:  No Further Intervention Required/No Barriers to Discharge, Sudden Infant Death Syndrome (SIDS) Education, Perinatal Mood and Anxiety Disorder (PMADs) Education, [place], CSW Will Continue to Monitor Umbilical Cord Tissue Drug Screen Results and Make Report if Foye Spurling, LCSW 06/22/2019, 3:10 PM

## 2019-06-22 NOTE — Anesthesia Postprocedure Evaluation (Signed)
Anesthesia Post Note  Patient: Adrianne J Schroll  Procedure(s) Performed: AN AD HOC LABOR EPIDURAL     Patient location during evaluation: Mother Baby Anesthesia Type: Epidural Level of consciousness: awake, oriented and awake and alert Pain management: pain level controlled Vital Signs Assessment: post-procedure vital signs reviewed and stable Respiratory status: spontaneous breathing, nonlabored ventilation and respiratory function stable Cardiovascular status: stable Postop Assessment: patient able to bend at knees, no headache, no backache, no apparent nausea or vomiting, adequate PO intake and able to ambulate Anesthetic complications: no    Last Vitals:  Vitals:   06/22/19 0200 06/22/19 0521  BP: 122/68 116/86  Pulse: 76 78  Resp: 18 18  Temp: 36.8 C 36.9 C  SpO2:      Last Pain:  Vitals:   06/22/19 0521  TempSrc: Oral  PainSc: 3    Pain Goal:                   Edwinna Rochette

## 2019-06-22 NOTE — Progress Notes (Signed)
Post Partum Day 1 Subjective: Patient reports feeling well. She is tolerating PO. Ambulating and urinating without difficulty. Lochia minimal.   Objective: Blood pressure 122/68, pulse 76, temperature 98.2 F (36.8 C), temperature source Oral, resp. rate 18, height 5\' 3"  (1.6 m), weight 83.9 kg, last menstrual period 08/16/2018, SpO2 100 %, unknown if currently breastfeeding.  Physical Exam:  General: alert, cooperative, appears stated age and no distress Lochia: appropriate Uterine Fundus: firm Incision: NA DVT Evaluation: No evidence of DVT seen on physical exam.  Recent Labs    06/21/19 1058  HGB 12.0  HCT 38.7    Assessment/Plan: Plan for discharge tomorrow  Desires IUD instead of Nexplanon after discussion; message sent to clinic to coordinate Needs Rhogam prior to DC due to infant blood type S/p VBAC Vitals stable; 2 elevated BP's but currently normotensive; will continue to monitor  Formula feeding   LOS: 1 day   06/23/19 06/22/2019, 5:18 AM

## 2019-06-22 NOTE — Progress Notes (Signed)
CSW acknowledged consult and attempted to meet with MOB. However, MOB noted to be asleep. CSW will attempt to meet with MOB at a later time.  Azarya Oconnell, LCSW [facility] [phone] 

## 2019-06-23 LAB — RH IG WORKUP (INCLUDES ABO/RH)
ABO/RH(D): B NEG
Fetal Screen: NEGATIVE
Gestational Age(Wks): 38.1
Unit division: 0

## 2019-06-23 MED ORDER — IBUPROFEN 600 MG PO TABS: 600.0000 mg | ORAL_TABLET | Freq: Four times a day (QID) | ORAL | 0 refills | Status: DC

## 2019-06-23 MED ORDER — MEDROXYPROGESTERONE ACETATE 150 MG/ML IM SUSP
150.0000 mg | Freq: Once | INTRAMUSCULAR | Status: AC
  Administered 2019-06-23: 09:00:00 150 mg via INTRAMUSCULAR
  Filled 2019-06-23: qty 1

## 2019-06-23 NOTE — Discharge Instructions (Signed)

## 2019-06-23 NOTE — Discharge Summary (Signed)
Postpartum Discharge Summary      Patient Name: Samantha Hood DOB: 21-Jan-1996 MRN: [id number]  Date of admission: 06/21/2019 Delivering Provider: Gladys Damme   Date of discharge: 06/23/2019  Admitting diagnosis: Post term pregnancy at [redacted] weeks gestation [O48.0, Z3A.41] Intrauterine pregnancy: [redacted]w[redacted]d    Secondary diagnosis:  Principal Problem:   SVD (spontaneous vaginal delivery) Active Problems:   Post term pregnancy at [redacted] weeks gestation  Additional problems: None     Discharge diagnosis: Term Pregnancy Delivered                                                                                                Post partum procedures:rhogam  Augmentation: AROM  Complications: None  Hospital course:  Onset of Labor With Vaginal Delivery     24y.o. yo [id number] 490w1das admitted in Active Labor on 2/14/2021Patient presented with CVE 6/90/0 and was expectantly managed while awaiting adequate GBS prophylaxis. After 4 hours, CVE progress to 6/80/-1 and patient was AROMed with clear fluid, IUPC and FSE placed at that time. Pitocin initiated at 1730, and discontinued at 1827 for late decelerations. Patient then progressed to complete. Patient had an uncomplicated labor course as follows:  Membrane Rupture Time/Date: 4:08 PM ,06/21/2019   Intrapartum Procedures: Episiotomy: None [1]                                         Lacerations:  None [1]  Patient had a delivery of a Viable infant. 06/21/2019  Information for the patient's newborn:  SnErnestina, Joe0[[id number]]Delivery Method: Vaginal, Spontaneous(Filed from Delivery Summary)     Pateint had an uncomplicated postpartum course.  She is ambulating, tolerating a regular diet, passing flatus, and urinating well. Patient is discharged home in stable condition on 06/23/19.  Delivery time: 6:59 PM    Magnesium Sulfate received: No BMZ received: No Rhophylac:Yes MMR:No Transfusion:No  Physical exam  Vitals:   06/22/19 1015 06/22/19 1300 06/22/19 2237 06/23/19 0619  BP: 125/78 121/77 125/85 124/71  Pulse: 77 72 75 75  Resp: 18 17 18 16   Temp: 98.4 F (36.9 C) 98.1 F (36.7 C) 97.8 F (36.6 C) 98.2 F (36.8 C)  TempSrc: Oral Oral Oral Oral  SpO2: 100% 98%  99%  Weight:      Height:       General: alert, cooperative and no distress  Chest: Lungs CTA, Heart RRR Abdomen: Soft, Appropriately Tender, Non/Distended, BS x 4Q  Lochia: appropriate Uterine Fundus: firm at umbilicus Incision: N/A DVT Evaluation: No significant calf/ankle edema. Labs: Lab Results  Component Value Date   WBC 10.0 06/21/2019   HGB 12.0 06/21/2019   HCT 38.7 06/21/2019   MCV 82.2 06/21/2019   PLT 157 06/21/2019   CMP Latest Ref Rng & Units 06/21/2019  Glucose 70 - 99 mg/dL 88  BUN 6 - 20 mg/dL 8  Creatinine 0.44 - 1.00 mg/dL 0.64  Sodium 135 - 145 mmol/L 137  Potassium 3.5 -  5.1 mmol/L 4.2  Chloride 98 - 111 mmol/L 107  CO2 22 - 32 mmol/L 19(L)  Calcium 8.9 - 10.3 mg/dL 9.1  Total Protein 6.5 - 8.1 g/dL 6.4(L)  Total Bilirubin 0.3 - 1.2 mg/dL 0.3  Alkaline Phos 38 - 126 U/L 198(H)  AST 15 - 41 U/L 23  ALT 0 - 44 U/L 13   Edinburgh Score: Edinburgh Postnatal Depression Scale Screening Tool 06/22/2019  I have been able to laugh and see the funny side of things. 0  I have looked forward with enjoyment to things. 0  I have blamed myself unnecessarily when things went wrong. 2  I have been anxious or worried for no good reason. 0  I have felt scared or panicky for no good reason. 0  Things have been getting on top of me. 0  I have been so unhappy that I have had difficulty sleeping. 0  I have felt sad or miserable. 0  I have been so unhappy that I have been crying. 0  The thought of harming myself has occurred to me. 0  Edinburgh Postnatal Depression Scale Total 2    Discharge instruction: per After Visit Summary and "Baby and Me Booklet". Pain Management, Peri-Care, Breastfeeding, Who and When to  call for postpartum complications. Information Sheet(s) given IUD   After visit meds:  Allergies as of 06/23/2019   No Known Allergies     Medication List    STOP taking these medications   terconazole 0.8 % vaginal cream Commonly known as: TERAZOL 3     TAKE these medications   Blood Pressure Kit Devi 1 Device by Does not apply route as needed. ICD 10: Z34.90   ibuprofen 600 MG tablet Commonly known as: ADVIL Take 1 tablet (600 mg total) by mouth every 6 (six) hours.   Prenatal Complete 14-0.4 MG Tabs Take 1 tablet by mouth daily.       Diet: routine diet  Activity: Advance as tolerated. Pelvic rest for 6 weeks.   Outpatient follow up:4 weeks Follow up Appt: Future Appointments  Date Time Provider [address]  06/25/2019  9:00 AM [facility] None   Follow up Visit: [facility] Follow up.   Specialty: Obstetrics and Gynecology Contact information: [address] [place], [place] [id number] [place] [id number] [phone]            Please schedule this patient for Postpartum visit in: 4 weeks with the following provider: Any provider In-Person For C/S patients schedule nurse incision check in weeks 2 weeks: no Low risk pregnancy complicated by: H/O C/S with successful VBAC Delivery mode:  SVD Anticipated Birth Control:  Depo/IUD PP Procedures needed: None  Schedule Integrated BH visit: no  Message Sent  Newborn Data: Live born female  Birth Weight: 6 lb 6.1 oz (2895 g) APGAR: 8, 9  Newborn Delivery   Birth date/time: 06/21/2019 18:59:00 Delivery type: Vaginal, Spontaneous      Baby Feeding: Bottle Disposition:home with mother   06/23/2019 Maryann Conners, CNM

## 2019-06-24 ENCOUNTER — Other Ambulatory Visit: Payer: Medicaid Other

## 2019-06-24 ENCOUNTER — Encounter: Payer: Medicaid Other | Admitting: Family Medicine

## 2019-06-24 LAB — TYPE AND SCREEN
ABO/RH(D): B NEG
Antibody Screen: POSITIVE
Unit division: 0
Unit division: 0

## 2019-06-24 LAB — BPAM RBC
Blood Product Expiration Date: 202102252359
Blood Product Expiration Date: 202102282359
Unit Type and Rh: 1700
Unit Type and Rh: 1700

## 2019-06-25 ENCOUNTER — Other Ambulatory Visit (HOSPITAL_COMMUNITY): Payer: No Typology Code available for payment source

## 2019-06-27 ENCOUNTER — Inpatient Hospital Stay (HOSPITAL_COMMUNITY): Payer: Medicaid Other

## 2019-06-27 ENCOUNTER — Inpatient Hospital Stay (HOSPITAL_COMMUNITY): Admission: AD | Admit: 2019-06-27 | Payer: Medicaid Other | Source: Home / Self Care | Admitting: Family Medicine

## 2019-07-30 ENCOUNTER — Ambulatory Visit: Payer: Medicaid Other | Admitting: Medical

## 2019-08-12 ENCOUNTER — Ambulatory Visit: Payer: Medicaid Other | Admitting: Obstetrics & Gynecology

## 2019-09-01 ENCOUNTER — Ambulatory Visit: Payer: Medicaid Other | Admitting: Nurse Practitioner

## 2019-09-08 ENCOUNTER — Other Ambulatory Visit: Payer: Self-pay

## 2019-09-08 MED ORDER — NORGESTIMATE-ETH ESTRADIOL 0.25-35 MG-MCG PO TABS: 1.0000 | ORAL_TABLET | Freq: Every day | ORAL | 11 refills | Status: DC

## 2019-09-08 NOTE — Progress Notes (Signed)
Pt requests to have BCPs instead of Depo Provera due it being able to be prescribed.  Pt could not make it to her appt and requested for BCP.  Per Nolene Bernheim, NP pt can a refill on her BCP.  Per chart review pt last Depo Provera was given on 06/23/19 and due for another one May 4th to May 18th, so pt is not late.  Sprintec e-prescribed due to pt reporting that she is not breastfeeding.    Addison Naegeli, RN

## 2019-09-22 ENCOUNTER — Ambulatory Visit: Payer: Medicaid Other | Admitting: Student

## 2019-09-22 ENCOUNTER — Encounter: Payer: Self-pay | Admitting: Family Medicine

## 2019-11-18 ENCOUNTER — Emergency Department (HOSPITAL_COMMUNITY): Payer: Medicaid Other

## 2019-11-18 ENCOUNTER — Other Ambulatory Visit: Payer: Self-pay

## 2019-11-18 ENCOUNTER — Emergency Department (HOSPITAL_COMMUNITY)
Admission: EM | Admit: 2019-11-18 | Discharge: 2019-11-19 | Disposition: A | Discharge status: Home / Self Care | Payer: Medicaid Other | Attending: Emergency Medicine | Admitting: Emergency Medicine

## 2019-11-18 ENCOUNTER — Encounter (HOSPITAL_COMMUNITY): Payer: Self-pay

## 2019-11-18 DIAGNOSIS — R1013 Epigastric pain: Secondary | ICD-10-CM | POA: Insufficient documentation

## 2019-11-18 DIAGNOSIS — R0602 Shortness of breath: Secondary | ICD-10-CM | POA: Diagnosis present

## 2019-11-18 DIAGNOSIS — U071 COVID-19: Secondary | ICD-10-CM | POA: Insufficient documentation

## 2019-11-18 DIAGNOSIS — N390 Urinary tract infection, site not specified: Secondary | ICD-10-CM | POA: Diagnosis not present

## 2019-11-18 DIAGNOSIS — R000 Tachycardia, unspecified: Secondary | ICD-10-CM | POA: Diagnosis not present

## 2019-11-18 LAB — COMPREHENSIVE METABOLIC PANEL
ALT: 12 U/L (ref 0–44)
AST: 16 U/L (ref 15–41)
Albumin: 4 g/dL (ref 3.5–5.0)
Alkaline Phosphatase: 60 U/L (ref 38–126)
Anion gap: 14 (ref 5–15)
BUN: 8 mg/dL (ref 6–20)
CO2: 23 mmol/L (ref 22–32)
Calcium: 9.3 mg/dL (ref 8.9–10.3)
Chloride: 98 mmol/L (ref 98–111)
Creatinine, Ser: 0.92 mg/dL (ref 0.44–1.00)
GFR calc Af Amer: >60 mL/min (ref >60)
GFR calc non Af Amer: >60 mL/min (ref >60)
Glucose, Bld: 104 mg/dL — ABNORMAL HIGH (ref 70–99)
Potassium: 3.4 mmol/L — ABNORMAL LOW (ref 3.5–5.1)
Sodium: 135 mmol/L (ref 135–145)
Total Bilirubin: 0.9 mg/dL (ref 0.3–1.2)
Total Protein: 8 g/dL (ref 6.5–8.1)

## 2019-11-18 LAB — CBC WITH DIFFERENTIAL/PLATELET
Abs Immature Granulocytes: 0.02 10*3/uL (ref 0.00–0.07)
Basophils Absolute: 0 10*3/uL (ref 0.0–0.1)
Basophils Relative: 0 %
Eosinophils Absolute: 0 10*3/uL (ref 0.0–0.5)
Eosinophils Relative: 0 %
HCT: 40.3 % (ref 36.0–46.0)
Hemoglobin: 13.3 g/dL (ref 12.0–15.0)
Immature Granulocytes: 0 %
Lymphocytes Relative: 11 %
Lymphs Abs: 1 10*3/uL (ref 0.7–4.0)
MCH: 27.5 pg (ref 26.0–34.0)
MCHC: 33 g/dL (ref 30.0–36.0)
MCV: 83.4 fL (ref 80.0–100.0)
Monocytes Absolute: 1.2 10*3/uL — ABNORMAL HIGH (ref 0.1–1.0)
Monocytes Relative: 13 %
Neutro Abs: 7.3 10*3/uL (ref 1.7–7.7)
Neutrophils Relative %: 76 %
Platelets: 198 10*3/uL (ref 150–400)
RBC: 4.83 MIL/uL (ref 3.87–5.11)
RDW: 12.9 % (ref 11.5–15.5)
WBC: 9.6 10*3/uL (ref 4.0–10.5)
nRBC: 0 % (ref 0.0–0.2)

## 2019-11-18 LAB — URINALYSIS, ROUTINE W REFLEX MICROSCOPIC
Bilirubin Urine: NEGATIVE
Glucose, UA: NEGATIVE mg/dL
Hgb urine dipstick: NEGATIVE
Ketones, ur: 20 mg/dL — AB
Nitrite: POSITIVE — AB
Protein, ur: NEGATIVE mg/dL
Specific Gravity, Urine: 1.016 (ref 1.005–1.030)
WBC, UA: >50 WBC/hpf — ABNORMAL HIGH (ref 0–5)
pH: 6 (ref 5.0–8.0)

## 2019-11-18 LAB — D-DIMER, QUANTITATIVE: D-Dimer, Quant: 3.19 ug/mL-FEU — ABNORMAL HIGH (ref 0.00–0.50)

## 2019-11-18 LAB — I-STAT BETA HCG BLOOD, ED (MC, WL, AP ONLY): I-stat hCG, quantitative: <5 m[IU]/mL (ref <5)

## 2019-11-18 LAB — TYPE AND SCREEN
ABO/RH(D): B NEG
Antibody Screen: NEGATIVE

## 2019-11-18 LAB — TROPONIN I (HIGH SENSITIVITY): Troponin I (High Sensitivity): <2 ng/L (ref <18)

## 2019-11-18 MED ORDER — ACETAMINOPHEN 325 MG PO TABS
650.0000 mg | ORAL_TABLET | Freq: Once | ORAL | Status: AC | PRN
  Administered 2019-11-18: 650 mg via ORAL
  Filled 2019-11-18: qty 2

## 2019-11-18 MED ORDER — IOHEXOL 350 MG/ML SOLN
100.0000 mL | Freq: Once | INTRAVENOUS | Status: AC | PRN
  Administered 2019-11-18: 75 mL via INTRAVENOUS

## 2019-11-18 MED ORDER — SODIUM CHLORIDE 0.9 % IV BOLUS
1000.0000 mL | Freq: Once | INTRAVENOUS | Status: AC
  Administered 2019-11-18: 1000 mL via INTRAVENOUS

## 2019-11-18 MED ORDER — IOHEXOL 300 MG/ML  SOLN: 100.0000 mL | Freq: Once | INTRAMUSCULAR | Status: DC | PRN

## 2019-11-18 MED ORDER — ALBUTEROL SULFATE (2.5 MG/3ML) 0.083% IN NEBU: 5.0000 mg | INHALATION_SOLUTION | Freq: Once | RESPIRATORY_TRACT | Status: DC

## 2019-11-18 NOTE — ED Triage Notes (Signed)
Pt BIB [facility] from home with c/o SHOB, cough, chills and right sided rib pain with cough. Pt denies COIVD exposure and denies taking any medications for her fever. Pt is A&Ox4.

## 2019-11-18 NOTE — ED Provider Notes (Addendum)
Care assumed from [facility] L. Briese at shift change, please see her note for full details, but in brief Samantha Hood is a 24 y.o. female who presents with fever, shortness of breath and chest pain.  Symptoms have been constant for 2 days, febrile to 102 on arrival and tachycardic but in no respiratory distress, no oxygen requirement.  Think she had Covid several months ago but was never tested.  Patient's fever has improved with Tylenol and tachycardia is improving as well.  Her chest x-ray is clear, she does not have a leukocytosis, her D-dimer was significantly elevated at 3.19 patient will receive CTA, awaiting troponin as well.  No significant electrolyte derangements.  Plan: Follow-up on troponin, CTA, UA and Covid test  BP 111/70   Pulse 97   Temp 99.1 F (37.3 C)   Resp 16   SpO2 100%   ED Course/Procedures   Labs Reviewed  SARS CORONAVIRUS 2 BY RT PCR (HOSPITAL ORDER, PERFORMED IN [facility]) - Abnormal; Notable for the following components:      Result Value   SARS Coronavirus 2 POSITIVE (*)    All other components within normal limits  COMPREHENSIVE METABOLIC PANEL - Abnormal; Notable for the following components:   Potassium 3.4 (*)    Glucose, Bld 104 (*)    All other components within normal limits  CBC WITH DIFFERENTIAL/PLATELET - Abnormal; Notable for the following components:   Monocytes Absolute 1.2 (*)    All other components within normal limits  D-DIMER, QUANTITATIVE (NOT AT [facility]) - Abnormal; Notable for the following components:   D-Dimer, Quant 3.19 (*)    All other components within normal limits  URINALYSIS, ROUTINE W REFLEX MICROSCOPIC - Abnormal; Notable for the following components:   APPearance HAZY (*)    Ketones, ur 20 (*)    Nitrite POSITIVE (*)    Leukocytes,Ua LARGE (*)    WBC, UA >50 (*)    Bacteria, UA MANY (*)    Non Squamous Epithelial 0-5 (*)    All other components within normal limits  URINE CULTURE  I-STAT BETA HCG  BLOOD, ED ([facility], [facility], [facility] ONLY)  TYPE AND SCREEN  TROPONIN I (HIGH SENSITIVITY)    DG Chest 2 View  Result Date: 11/18/2019 CLINICAL DATA:  Shortness of breath.  Epigastric pain. EXAM: CHEST - 2 VIEW COMPARISON:  None. FINDINGS: The cardiomediastinal contours are normal. The lungs are clear. Pulmonary vasculature is normal. No consolidation, pleural effusion, or pneumothorax. Minor scoliotic curvature of the upper thoracic spine no acute osseous abnormalities are seen. IMPRESSION: Minor thoracic scoliosis, otherwise negative radiographs of the chest. Electronically Signed   By: Narda Rutherford M.D.   On: 11/18/2019 21:14   CT Angio Chest PE W/Cm &/Or Wo Cm  Result Date: 11/18/2019 CLINICAL DATA:  Shortness of breath with elevated D-dimer. Cough and chills. Right-sided pain. EXAM: CT ANGIOGRAPHY CHEST WITH CONTRAST TECHNIQUE: Multidetector CT imaging of the chest was performed using the standard protocol during bolus administration of intravenous contrast. Multiplanar CT image reconstructions and MIPs were obtained to evaluate the vascular anatomy. CONTRAST:  45mL OMNIPAQUE IOHEXOL 350 MG/ML SOLN COMPARISON:  Chest radiograph earlier this day. FINDINGS: Cardiovascular: There are no filling defects within the pulmonary arteries to suggest pulmonary embolus. Mild motion artifact in the lung bases partially obscures detailed evaluation. Normal caliber thoracic aorta. The heart is normal in size. There is no pericardial effusion. Mediastinum/Nodes: No mediastinal or hilar adenopathy. No enlarged axillary lymph nodes. No  esophageal wall thickening. No visualized thyroid nodule. Lungs/Pleura: Minor atelectasis at the left lung base, the lungs are otherwise clear. No confluent or focal airspace disease. No pleural fluid. No findings of pulmonary edema. Trachea and central bronchi are patent. Upper Abdomen: No acute or unexpected findings. Musculoskeletal: There are no acute or suspicious osseous abnormalities. Minor  scoliosis of the upper thoracic spine. Chest wall soft tissues are unremarkable. Review of the MIP images confirms the above findings. IMPRESSION: No pulmonary embolus or acute intrathoracic abnormality. Electronically Signed   By: Narda Rutherford M.D.   On: 11/18/2019 23:19     Procedures  MDM   CTA without evidence of PE or other acute intrathoracic abnormality.  Still awaiting Covid test and urinalysis.  Urinalysis positive for nitrites with leukocytes, with greater than 50 WBCs and many bacteria present.  Patient does report that a few days ago she had some burning which is since resolved but she is continued to have some urinary frequency.  Had some intermittent right flank pains.  Will give dose of Rocephin here in the ED, urine culture sent, will treat with Keflex.  Notified by lab that her Covid test has returned positive as well both of these could be contributing to patient's fever, but she has had O2 sats of 100% and does not have any signs of pneumonia on chest x-ray.  Patient did have Covid-like symptoms a month ago and was around someone with Covid but did not get tested, her positive test could also be related to that previous infection but have asked the patient to quarantine as if she has an active Covid infection at this time.  Discussed supportive care at home and strict return precautions.  At this time is stable for discharge home.   Final diagnoses:  COVID-19 virus infection  Acute UTI   ED Discharge Orders         Ordered    cephALEXin (KEFLEX) 500 MG capsule  4 times daily     Discontinue  Reprint     11/19/19 0053                Dartha Lodge, PA-C 11/19/19 0059    Nira Conn, MD 11/19/19 [phone]

## 2019-11-18 NOTE — ED Notes (Signed)
Pt transported to CT ?

## 2019-11-18 NOTE — ED Provider Notes (Addendum)
[facility] DEPT Provider Note   CSN: [id number] Arrival date & time: 11/18/19  1930     History Chief Complaint  Patient presents with  . Shortness of Breath  . Fever    Samantha Hood is a 24 y.o. female with past medical history significant for anemia. Did not receive covid vaccinations.  HPI Patient presents to emergency department today via EMS with chief complaint of shortness of breath and fever x2 days.  Patient states her symptoms have been constant.  She is also endorsing pleuritic chest pain and nausea without emesis. She has not checked her temperature at home however has felt sweaty and chills.  No medications for symptoms prior to arrival.  Patient thinks she had Covid several months ago when she was sick with similar symptoms however at that time she also had loss of sense of taste and smell. She was never tested for Covid and her symptoms resolved within 3 weeks.   She denies any sick contacts or known covid exposures. Denies any sore throat, cough, hemoptysis, abdominal pain, back pain, gross hematuria, urinary frequency, dysuria, pelvic pain, abnormal vaginal bleeding, diarrhea, blood in stool. Denies exogenous estrogen. She denies any family history of cardiac disease.  Patient is a non-smoker.  She states she was told she had anemia during pregnancy and was prescribed iron supplements.  She is not currently taking iron supplements. She has a 22 mont old at home and menses cycle has not yet resumed after pregnancy.     Past Medical History:  Diagnosis Date  . Anemia   . Chlamydia vaginitis/cervicitis 01/13/2015  . Medical history non-contributory   . Patient denies medical problems     Patient Active Problem List   Diagnosis Date Noted  . SVD (spontaneous vaginal delivery) 06/23/2019  . Post term pregnancy at [redacted] weeks gestation 06/21/2019  . GBS (group B Streptococcus carrier), +RV culture, currently pregnant 05/28/2019  . Previous  cesarean section complicating pregnancy, antepartum condition or complication 72/62/0355  . Supervision of high risk pregnancy, antepartum 01/30/2019  . History of VBAC 07/16/2016    Past Surgical History:  Procedure Laterality Date  . CESAREAN SECTION    . ORIF MANDIBULAR FRACTURE N/A 04/15/2018   Procedure: OPEN REDUCTION INTERNAL FIXATION (ORIF) MANDIBULAR FRACTURE;  Surgeon: Diona Browner, DDS;  Location: [facility];  Service: Oral Surgery;  Laterality: N/A;     OB History    Gravida  5   Para  4   Term  4   Preterm  0   AB  1   Living  4     SAB  0   TAB  0   Ectopic  0   Multiple  0   Live Births  4           History reviewed. No pertinent family history.  Social History   Tobacco Use  . Smoking status: Never Smoker  . Smokeless tobacco: Never Used  Vaping Use  . Vaping Use: Never used  Substance Use Topics  . Alcohol use: Not Currently    Comment: socially   . Drug use: No    Home Medications Prior to Admission medications   Medication Sig Start Date End Date Taking? Authorizing Provider  Blood Pressure Monitoring (BLOOD PRESSURE KIT) DEVI 1 Device by Does not apply route as needed. ICD 10: Z34.90 01/30/19   Aletha Halim, MD  ibuprofen (ADVIL) 600 MG tablet Take 1 tablet (600 mg total) by mouth every 6 (  six) hours. Patient not taking: Reported on 11/18/2019 06/23/19   Gavin Pound, CNM  norgestimate-ethinyl estradiol (ORTHO-CYCLEN) 0.25-35 MG-MCG tablet Take 1 tablet by mouth daily. Patient not taking: Reported on 11/18/2019 09/08/19   Virginia Rochester, NP  Prenatal Vit-Fe Fumarate-FA (PRENATAL COMPLETE) 14-0.4 MG TABS Take 1 tablet by mouth daily. Patient not taking: Reported on 11/18/2019 11/14/18   Zigmund Gottron, NP    Allergies    Patient has no known allergies.  Review of Systems   Review of Systems  All other systems are reviewed and are negative for acute change except as noted in the HPI.   Physical Exam Updated Vital Signs BP  125/69 (BP Location: Left Arm)   Pulse (!) 120   Temp (!) 102.4 F (39.1 C) (Oral)   Resp (!) 22   SpO2 100%   Physical Exam Vitals and nursing note reviewed.  Constitutional:      General: She is not in acute distress.    Appearance: She is not ill-appearing.  HENT:     Head: Normocephalic and atraumatic.     Right Ear: Tympanic membrane and external ear normal.     Left Ear: Tympanic membrane and external ear normal.     Nose: Nose normal.     Mouth/Throat:     Mouth: Mucous membranes are moist.     Pharynx: Oropharynx is clear. Uvula midline. No oropharyngeal exudate, posterior oropharyngeal erythema or uvula swelling.     Tonsils: No tonsillar exudate or tonsillar abscesses.  Eyes:     General: No scleral icterus.       Right eye: No discharge.        Left eye: No discharge.     Extraocular Movements: Extraocular movements intact.     Conjunctiva/sclera: Conjunctivae normal.     Pupils: Pupils are equal, round, and reactive to light.  Neck:     Vascular: No JVD.  Cardiovascular:     Rate and Rhythm: Regular rhythm. Tachycardia present.     Pulses: Normal pulses.          Radial pulses are 2+ on the right side and 2+ on the left side.     Heart sounds: Normal heart sounds.  Pulmonary:     Comments: Lungs clear to auscultation in all fields. Symmetric chest rise. No wheezing, rales, or rhonchi. Chest:     Chest wall: No tenderness.  Abdominal:     Comments: Abdomen is soft, non-distended, and non-tender in all quadrants. No rigidity, no guarding. No peritoneal signs.  Musculoskeletal:        General: Normal range of motion.     Cervical back: Normal range of motion.  Skin:    General: Skin is warm and dry.     Capillary Refill: Capillary refill takes less than 2 seconds.  Neurological:     Mental Status: She is oriented to person, place, and time.     GCS: GCS eye subscore is 4. GCS verbal subscore is 5. GCS motor subscore is 6.     Comments: Fluent speech, no  facial droop.  Psychiatric:        Behavior: Behavior normal.     ED Results / Procedures / Treatments   Labs (all labs ordered are listed, but only abnormal results are displayed) Labs Reviewed  COMPREHENSIVE METABOLIC PANEL - Abnormal; Notable for the following components:      Result Value   Potassium 3.4 (*)    Glucose, Bld 104 (*)  All other components within normal limits  CBC WITH DIFFERENTIAL/PLATELET - Abnormal; Notable for the following components:   Monocytes Absolute 1.2 (*)    All other components within normal limits  D-DIMER, QUANTITATIVE (NOT AT [facility]) - Abnormal; Notable for the following components:   D-Dimer, Quant 3.19 (*)    All other components within normal limits  SARS CORONAVIRUS 2 BY RT PCR (HOSPITAL ORDER, [facility])  URINALYSIS, ROUTINE W REFLEX MICROSCOPIC  I-STAT BETA HCG BLOOD, ED ([place], [facility], [facility] ONLY)  TYPE AND SCREEN  TROPONIN I (HIGH SENSITIVITY)    EKG EKG Interpretation  Date/Time:  Wednesday November 18 2019 21:53:58 EDT Ventricular Rate:  95 PR Interval:    QRS Duration: 100 QT Interval:  339 QTC Calculation: 427 R Axis:   97 Text Interpretation: Sinus rhythm Borderline right axis deviation Nonspecific T abnormalities, inferior leads No previous ECGs available Confirmed by Fredia Sorrow [phone]) on 11/18/2019 10:38:44 PM   Radiology DG Chest 2 View  Result Date: 11/18/2019 CLINICAL DATA:  Shortness of breath.  Epigastric pain. EXAM: CHEST - 2 VIEW COMPARISON:  None. FINDINGS: The cardiomediastinal contours are normal. The lungs are clear. Pulmonary vasculature is normal. No consolidation, pleural effusion, or pneumothorax. Minor scoliotic curvature of the upper thoracic spine no acute osseous abnormalities are seen. IMPRESSION: Minor thoracic scoliosis, otherwise negative radiographs of the chest. Electronically Signed   By: Keith Rake M.D.   On: 11/18/2019 21:14    Procedures Procedures (including  critical care time)  Medications Ordered in ED Medications  acetaminophen (TYLENOL) tablet 650 mg (650 mg Oral Given 11/18/19 2109)  sodium chloride 0.9 % bolus 1,000 mL (1,000 mLs Intravenous New Bag/Given (Non-Interop) 11/18/19 2203)    ED Course  I have reviewed the triage vital signs and the nursing notes.  Pertinent labs & imaging results that were available during my care of the patient were reviewed by me and considered in my medical decision making (see chart for details).  Vitals:   11/18/19 1935 11/18/19 2134  BP: 125/69 122/75  Pulse: (!) 120 (!) 111  Resp: (!) 22 18  Temp: (!) 102.4 F (39.1 C) 99.1 F (37.3 C)  TempSrc: Oral   SpO2: 100% 100%       MDM Rules/Calculators/A&P                          History provided by patient with additional history obtained from chart review.    Patient seen and examined. Patient presents awake, alert, hemodynamically stable, non toxic.  Patient noted to be febrile to 102.4 in triage and tachycardic to 120 with a respiratory rate of 22.  She was normotensive with 100% oxygen saturation on room air. Given 65m of Tylenol in triage. Ddx includes covid, PE, anemia, pneumonia, viral URI, ACS, pericarditis. Patient had approximately 2-hour wait in the lobby.  Upon being placed in exam room her temperature was rechecked and had improved to 99.1 with heart rate improved, now ranging from 102-111.  Her lungs are clear to auscultation in all fields and she has normal work of breathing.  Her abdomen is benign, no tenderness, no peritoneal signs. She declines need for analgesics. IVF given.  I viewed pt's chest xray and it does not suggest acute infectious processes. CBC and CMP without significant abnormalities- no leukocytosis, anemia, severe electrolyte derangements, normal kidney function, no transaminitis.Type and screen ordered as symptomatic anemia was in the ddx, however CBC is  reassuring. Pregnancy test is negative. D-dimer is elevated  at 3.19, CTA ordered.  EKG without STEMI.  Troponin, covid test, UA, CTA pending at end of my shift.  Patient care transferred to K. Ford PA-C at the end of my shift. Patient presentation, ED course, and plan of care discussed with review of all pertinent labs and imaging. Please see her note for further details regarding further ED course and disposition.   Portions of this note were generated with [employer]. Dictation errors may occur despite best attempts at proofreading.   Final Clinical Impression(s) / ED Diagnoses Final diagnoses:  None    Rx / DC Orders ED Discharge Orders    None       Cherre Robins, PA-C 11/18/19 2245    Albrizze, Harley Hallmark, PA-C 11/18/19 2248    Fredia Sorrow, MD 11/19/19 [phone]

## 2019-11-19 LAB — SARS CORONAVIRUS 2 BY RT PCR (HOSPITAL ORDER, PERFORMED IN ~~LOC~~ HOSPITAL LAB): SARS Coronavirus 2: POSITIVE — AB

## 2019-11-19 MED ORDER — SODIUM CHLORIDE 0.9 % IV SOLN
1.0000 g | Freq: Once | INTRAVENOUS | Status: AC
  Administered 2019-11-19: 1 g via INTRAVENOUS
  Filled 2019-11-19: qty 10

## 2019-11-19 MED ORDER — CEPHALEXIN 500 MG PO CAPS
500.0000 mg | ORAL_CAPSULE | Freq: Four times a day (QID) | ORAL | 0 refills | Status: DC
Start: 2019-11-19 — End: 2020-05-09

## 2019-11-19 NOTE — Discharge Instructions (Addendum)
You have tested positive for COVID-19 virus.  Please continue to quarantine at home and monitor your symptoms closely. You chest x-ray was clear. Antibiotics are not helpful in treating viral infection, the virus should run its course in about 10-14 days. Please make sure you are drinking plenty of fluids. You can treat your symptoms supportively with tylenol 1000 mg and ibuprofen 600 mg every 6 hours for fevers and pains, and over the counter cough syrups and throat lozenges to help with cough. If your symptoms are not improving please follow up with you Primary doctor.   You also have a urinary tract infection you should take antibiotics 4 times daily as directed it is important that you complete entire course of antibiotics even if your symptoms have resolved.  I recommend that you purchase a home pulse ox to help better monitor your oxygen at home, if you start to have increased work of breathing or shortness of breath or your oxygen drops below 90% please immediately return to the hospital for reevaluation.  If you develop persistent fevers, shortness of breath or difficulty breathing, chest pain, severe headache and neck pain, persistent nausea and vomiting or other new or concerning symptoms return to the Emergency department.

## 2019-11-20 ENCOUNTER — Telehealth: Payer: Self-pay | Admitting: Nurse Practitioner

## 2019-11-20 NOTE — Telephone Encounter (Signed)
Called to discuss with Samantha Hood about Covid symptoms and the use of casirivimab/imdevimab, a combination monoclonal antibody infusion for those with mild to moderate Covid symptoms and at a high risk of hospitalization.     Pt is qualified for this infusion at the [facility] infusion center due to co-morbid conditions and/or a member of an at-risk group (BMI >25). Per ED reports patient patient symptoms presented on 11/16/19 with cough, shortness of breath, and fever.  Last date she would be eligible for infusion is 11/25/19.  Unable to reach. Voicemail left and Mychart message sent.    Patient Active Problem List   Diagnosis Date Noted  . SVD (spontaneous vaginal delivery) 06/23/2019  . Post term pregnancy at [redacted] weeks gestation 06/21/2019  . GBS (group B Streptococcus carrier), +RV culture, currently pregnant 05/28/2019  . Previous cesarean section complicating pregnancy, antepartum condition or complication 02/18/2019  . Supervision of high risk pregnancy, antepartum 01/30/2019  . History of VBAC 07/16/2016    Willette Alma, AGPCNP-BC

## 2019-11-21 LAB — URINE CULTURE: Culture: >=100000 — AB

## 2020-02-11 ENCOUNTER — Encounter (HOSPITAL_COMMUNITY): Payer: Self-pay | Admitting: Emergency Medicine

## 2020-02-11 ENCOUNTER — Other Ambulatory Visit: Payer: Self-pay

## 2020-02-11 ENCOUNTER — Inpatient Hospital Stay (HOSPITAL_COMMUNITY)
Admission: AD | Admit: 2020-02-11 | Discharge: 2020-02-11 | Discharge status: Against Medical Advice | Payer: Medicaid Other | Attending: Obstetrics & Gynecology | Admitting: Obstetrics & Gynecology

## 2020-02-11 DIAGNOSIS — R1032 Left lower quadrant pain: Secondary | ICD-10-CM | POA: Diagnosis not present

## 2020-02-11 DIAGNOSIS — Z3A00 Weeks of gestation of pregnancy not specified: Secondary | ICD-10-CM | POA: Diagnosis not present

## 2020-02-11 DIAGNOSIS — O26899 Other specified pregnancy related conditions, unspecified trimester: Secondary | ICD-10-CM | POA: Insufficient documentation

## 2020-02-11 LAB — CBC
HCT: 38.2 % (ref 36.0–46.0)
Hemoglobin: 12.3 g/dL (ref 12.0–15.0)
MCH: 27.2 pg (ref 26.0–34.0)
MCHC: 32.2 g/dL (ref 30.0–36.0)
MCV: 84.5 fL (ref 80.0–100.0)
Platelets: 272 10*3/uL (ref 150–400)
RBC: 4.52 MIL/uL (ref 3.87–5.11)
RDW: 14.1 % (ref 11.5–15.5)
WBC: 8.6 10*3/uL (ref 4.0–10.5)
nRBC: 0 % (ref 0.0–0.2)

## 2020-02-11 LAB — URINALYSIS, ROUTINE W REFLEX MICROSCOPIC
Bilirubin Urine: NEGATIVE
Glucose, UA: NEGATIVE mg/dL
Hgb urine dipstick: NEGATIVE
Ketones, ur: 20 mg/dL — AB
Leukocytes,Ua: NEGATIVE
Nitrite: NEGATIVE
Protein, ur: NEGATIVE mg/dL
Specific Gravity, Urine: 1.028 (ref 1.005–1.030)
pH: 5 (ref 5.0–8.0)

## 2020-02-11 LAB — COMPREHENSIVE METABOLIC PANEL
ALT: 11 U/L (ref 0–44)
AST: 18 U/L (ref 15–41)
Albumin: 3.4 g/dL — ABNORMAL LOW (ref 3.5–5.0)
Alkaline Phosphatase: 46 U/L (ref 38–126)
Anion gap: 9 (ref 5–15)
BUN: 9 mg/dL (ref 6–20)
CO2: 20 mmol/L — ABNORMAL LOW (ref 22–32)
Calcium: 8.8 mg/dL — ABNORMAL LOW (ref 8.9–10.3)
Chloride: 105 mmol/L (ref 98–111)
Creatinine, Ser: 0.79 mg/dL (ref 0.44–1.00)
GFR calc non Af Amer: >60 mL/min (ref >60)
Glucose, Bld: 85 mg/dL (ref 70–99)
Potassium: 3.7 mmol/L (ref 3.5–5.1)
Sodium: 134 mmol/L — ABNORMAL LOW (ref 135–145)
Total Bilirubin: 0.5 mg/dL (ref 0.3–1.2)
Total Protein: 5.7 g/dL — ABNORMAL LOW (ref 6.5–8.1)

## 2020-02-11 LAB — I-STAT BETA HCG BLOOD, ED (MC, WL, AP ONLY): I-stat hCG, quantitative: >2000 m[IU]/mL — ABNORMAL HIGH (ref <5)

## 2020-02-11 LAB — HCG, QUANTITATIVE, PREGNANCY: hCG, Beta Chain, Quant, S: 20205 m[IU]/mL — ABNORMAL HIGH (ref <5)

## 2020-02-11 LAB — LIPASE, BLOOD: Lipase: 23 U/L (ref 11–51)

## 2020-02-11 NOTE — ED Triage Notes (Addendum)
Pt arrived to ED with complaints of LUQ pain and L flank pain starting x3 days ago. Denies dysuria put feels "pressure in her bladder. Denies N/V/D. Unprotected sex x2 weeks ago, "odor" since.

## 2020-02-11 NOTE — MAU Note (Signed)
Pt went to admission personnel stating she is leaving. Pt signed AMA form and left.

## 2020-02-11 NOTE — ED Provider Notes (Signed)
24 year old female presenting the emergency department today with 2-day history of left-sided abdominal pain.  Mostly focused in the left lower quadrant.  Pain initially intermittent but has been worse today and is more constant.  She also reports a pressure in her bladder.  She has had no dysuria but describes some urgency and nausea.  She denies any vomiting.  Denies any diarrhea, constipation, fevers.  Denies any abnormal vaginal bleeding or discharge.  States she thinks her last menstrual period was sometime last month.  She had some bleeding for 3 weeks.  She did not have a period prior to that.  Physical Exam  BP 122/69 (BP Location: Right Arm)   Pulse 96   Temp 98.7 F (37.1 C) (Oral)   Resp 14   Ht 5\' 3"  (1.6 m)   Wt 68 kg   SpO2 100%   BMI 26.57 kg/m   Physical Exam Vitals and nursing note reviewed.  Constitutional:      General: She is not in acute distress.    Appearance: She is well-developed.  HENT:     Head: Normocephalic and atraumatic.  Eyes:     Conjunctiva/sclera: Conjunctivae normal.  Cardiovascular:     Rate and Rhythm: Normal rate and regular rhythm.     Heart sounds: Normal heart sounds. No murmur heard.   Pulmonary:     Effort: Pulmonary effort is normal. No respiratory distress.     Breath sounds: Normal breath sounds. No wheezing, rhonchi or rales.  Abdominal:     General: Bowel sounds are normal.     Palpations: Abdomen is soft.     Tenderness: There is abdominal tenderness in the suprapubic area and left lower quadrant. There is no guarding or rebound.  Musculoskeletal:        General: Normal range of motion.     Cervical back: Neck supple.  Skin:    General: Skin is warm and dry.  Neurological:     Mental Status: She is alert.      ED Course/Procedures     Procedures  Results for orders placed or performed during the hospital encounter of 02/11/20  Lipase, blood  Result Value Ref Range   Lipase 23 11 - 51 U/L  Comprehensive metabolic  panel  Result Value Ref Range   Sodium 134 (L) 135 - 145 mmol/L   Potassium 3.7 3.5 - 5.1 mmol/L   Chloride 105 98 - 111 mmol/L   CO2 20 (L) 22 - 32 mmol/L   Glucose, Bld 85 70 - 99 mg/dL   BUN 9 6 - 20 mg/dL   Creatinine, Ser 04/12/20 0.44 - 1.00 mg/dL   Calcium 8.8 (L) 8.9 - 10.3 mg/dL   Total Protein 5.7 (L) 6.5 - 8.1 g/dL   Albumin 3.4 (L) 3.5 - 5.0 g/dL   AST 18 15 - 41 U/L   ALT 11 0 - 44 U/L   Alkaline Phosphatase 46 38 - 126 U/L   Total Bilirubin 0.5 0.3 - 1.2 mg/dL   GFR calc non Af Amer >60 >60 mL/min   Anion gap 9 5 - 15  CBC  Result Value Ref Range   WBC 8.6 4.0 - 10.5 K/uL   RBC 4.52 3.87 - 5.11 MIL/uL   Hemoglobin 12.3 12.0 - 15.0 g/dL   HCT 5.46 36 - 46 %   MCV 84.5 80.0 - 100.0 fL   MCH 27.2 26.0 - 34.0 pg   MCHC 32.2 30.0 - 36.0 g/dL   RDW 27.0  11.5 - 15.5 %   Platelets 272 150 - 400 K/uL   nRBC 0.0 0.0 - 0.2 %  Urinalysis, Routine w reflex microscopic  Result Value Ref Range   Color, Urine YELLOW YELLOW   APPearance HAZY (A) CLEAR   Specific Gravity, Urine 1.028 1.005 - 1.030   pH 5.0 5.0 - 8.0   Glucose, UA NEGATIVE NEGATIVE mg/dL   Hgb urine dipstick NEGATIVE NEGATIVE   Bilirubin Urine NEGATIVE NEGATIVE   Ketones, ur 20 (A) NEGATIVE mg/dL   Protein, ur NEGATIVE NEGATIVE mg/dL   Nitrite NEGATIVE NEGATIVE   Leukocytes,Ua NEGATIVE NEGATIVE  I-Stat beta hCG blood, ED  Result Value Ref Range   I-stat hCG, quantitative >2,000.0 (H) <5 mIU/mL   Comment 3           No results found.   MDM   24 year old female presenting to emergency department today for evaluation of suprapubic and left lower quadrant abdominal pain.  Does have some tenderness on exam with no rebound, rigidity or guarding.  Vital signs are reassuring  Reviewed/interpreted labs CBC is without leukocytosis or any CMP with no significant electrolyte derangement, normal liver and kidney function Lipase negative Beta-hCG greater than 2000, quantitative hCG added UA without leukocytes or  nitrites to suggest UTI  Patient likely needs further work-up and rule out ectopic.  Will contact MAU.  7:02 PM Discussed case with Melanie at MAU who accepts patient to transfer to MAU.     Rayne Du 02/11/20 1926    Tilden Fossa, MD 02/12/20 2014

## 2020-03-02 ENCOUNTER — Other Ambulatory Visit: Payer: Self-pay

## 2020-03-02 ENCOUNTER — Ambulatory Visit: Payer: Medicaid Other | Admitting: Nurse Practitioner

## 2020-03-02 DIAGNOSIS — O26899 Other specified pregnancy related conditions, unspecified trimester: Secondary | ICD-10-CM

## 2020-03-10 ENCOUNTER — Ambulatory Visit: Payer: Medicaid Other | Admitting: Obstetrics & Gynecology

## 2020-03-10 ENCOUNTER — Telehealth: Payer: Self-pay | Admitting: *Deleted

## 2020-03-10 ENCOUNTER — Ambulatory Visit: Admission: RE | Admit: 2020-03-10 | Payer: Medicaid Other | Source: Ambulatory Visit

## 2020-03-10 NOTE — Telephone Encounter (Signed)
Samantha Hood both [place] and appointment with provider. I called Samantha Hood and we discussed she has missed [place] twice and I asked if she has been ok since her ED visit. She denies any bleeding or severe pain. We discussed rescheduling [place] and I called scheduler with her on phone so we could find a time that works best. She agreed to 03/24/20 at 10:00.  I advised her if she has severe pain or heavy bleeding to go to hospital for evaluation. She voices understanding. Samantha Brickner,RN

## 2020-03-24 ENCOUNTER — Ambulatory Visit
Admission: RE | Admit: 2020-03-24 | Discharge: 2020-03-24 | Disposition: A | Discharge status: Home / Self Care | Payer: Medicaid Other | Source: Ambulatory Visit | Attending: Nurse Practitioner | Admitting: Nurse Practitioner

## 2020-03-24 ENCOUNTER — Other Ambulatory Visit: Payer: Self-pay

## 2020-03-24 ENCOUNTER — Other Ambulatory Visit: Payer: Self-pay | Admitting: Nurse Practitioner

## 2020-03-24 DIAGNOSIS — R109 Unspecified abdominal pain: Secondary | ICD-10-CM

## 2020-03-24 DIAGNOSIS — O26899 Other specified pregnancy related conditions, unspecified trimester: Secondary | ICD-10-CM | POA: Diagnosis not present

## 2020-03-28 ENCOUNTER — Ambulatory Visit: Payer: Medicaid Other | Admitting: Obstetrics & Gynecology

## 2020-04-20 ENCOUNTER — Telehealth: Payer: Self-pay | Admitting: Obstetrics & Gynecology

## 2020-04-20 NOTE — Telephone Encounter (Signed)
Patient is requesting a call back because she is experiencing some really bad tooth pains.

## 2020-04-21 NOTE — Telephone Encounter (Signed)
Called Dr. Donavan Foil who recommended Anbusol and then will need to follow up with Dentist or Urgent Care.   Addressed in My Chart Encounter.

## 2020-04-27 ENCOUNTER — Encounter: Payer: Medicaid Other | Admitting: Family Medicine

## 2020-05-07 NOTE — L&D Delivery Note (Signed)
Delivery Note Called to bedside and patient complete and pushing. At 3:34 AM a viable female was delivered via VBAC, Spontaneous (Presentation: Middle Occiput Posterior).  APGAR: 8, 9; weight pending.   Placenta status: Spontaneous, Intact.  Cord: 3 vessels with the following complications: None.   Anesthesia: Epidural Episiotomy: None Lacerations: None Est. Blood Loss (mL): 50  Mom to postpartum.  Baby to Couplet care / Skin to Skin.  Alric Seton 10/15/2020, 3:52 AM

## 2020-05-09 ENCOUNTER — Inpatient Hospital Stay (HOSPITAL_COMMUNITY)
Admission: AD | Admit: 2020-05-09 | Discharge: 2020-05-09 | Disposition: A | Discharge status: Home / Self Care | Payer: Medicaid Other | Attending: Family Medicine | Admitting: Family Medicine

## 2020-05-09 ENCOUNTER — Encounter (HOSPITAL_COMMUNITY): Payer: Self-pay | Admitting: Family Medicine

## 2020-05-09 ENCOUNTER — Other Ambulatory Visit: Payer: Self-pay

## 2020-05-09 DIAGNOSIS — R109 Unspecified abdominal pain: Secondary | ICD-10-CM | POA: Diagnosis not present

## 2020-05-09 DIAGNOSIS — O99612 Diseases of the digestive system complicating pregnancy, second trimester: Secondary | ICD-10-CM | POA: Diagnosis not present

## 2020-05-09 DIAGNOSIS — O26892 Other specified pregnancy related conditions, second trimester: Secondary | ICD-10-CM | POA: Insufficient documentation

## 2020-05-09 DIAGNOSIS — Z3A18 18 weeks gestation of pregnancy: Secondary | ICD-10-CM | POA: Insufficient documentation

## 2020-05-09 DIAGNOSIS — K047 Periapical abscess without sinus: Secondary | ICD-10-CM | POA: Diagnosis not present

## 2020-05-09 MED ORDER — AMOXICILLIN 875 MG PO TABS
875.0000 mg | ORAL_TABLET | Freq: Two times a day (BID) | ORAL | 0 refills | Status: DC
Start: 2020-05-09 — End: 2020-06-21

## 2020-05-09 NOTE — Discharge Instr - Supplementary Instructions (Signed)
Call your dentist TODAY to be seen as soon as possible.  Start antibiotics as ordered.

## 2020-05-09 NOTE — MAU Note (Signed)
Called ambulance a couple wks ago for tooth pain, was running a fever. They told her it was infected.   Pain had subsided, now returning, left side of jaw is swollen.  When she looks in her tooth- there are several holes now, is ? Pus coming out.  Called dentist, has to get ok from [facility] for them to see her. Every time her mouth hurts, it pulls in her abd- causing abd pain, when pain comes- baby balls up.  Feels like she is going to pass out from the pain, thinks she did last night.  Hasn't been eating because it hurts too much.

## 2020-05-09 NOTE — Discharge Instructions (Signed)
Dental Abscess  A dental abscess is an area of pus in or around a tooth. It comes from an infection. It can cause pain and other symptoms. Treatment will help with symptoms and prevent the infection from spreading. Follow these instructions at home: Medicines  Take over-the-counter and prescription medicines only as told by your dentist.  If you were prescribed an antibiotic medicine, take it as told by your dentist. Do not stop taking it even if you start to feel better.  If you were prescribed a gel that has numbing medicine in it, use it exactly as told.  Do not drive or use heavy machinery (like a lawn mower) while taking prescription pain medicine. General instructions  Rinse out your mouth often with salt water. ? To make salt water, dissolve -1 tsp of salt in 1 cup of warm water.  Eat a soft diet while your mouth is healing.  Drink enough fluid to keep your urine pale yellow.  Do not apply heat to the outside of your mouth.  Do not use any products that contain nicotine or tobacco. These include cigarettes and e-cigarettes. If you need help quitting, ask your doctor.  Keep all follow-up visits as told by your dentist. This is important. Prevent an abscess  Brush your teeth every morning and every night. Use fluoride toothpaste.  Floss your teeth each day.  Get dental cleanings as often as told by your dentist.  Think about getting dental sealant put on teeth that have deep holes (decay).  Drink water that has fluoride in it. ? Most tap water has fluoride. ? Check the label on bottled water to see if it has fluoride in it.  Drink water instead of sugary drinks.  Eat healthy meals and snacks.  Wear a mouth guard or face shield when you play sports. Contact a doctor if:  Your pain is worse, and medicine does not help. Get help right away if:  You have a fever or chills.  Your symptoms suddenly get worse.  You have a very bad headache.  You have problems  breathing or swallowing.  You have trouble opening your mouth.  You have swelling in your neck or close to your eye. Summary  A dental abscess is an area of pus in or around a tooth. It is caused by an infection.  Treatment will help with symptoms and prevent the infection from spreading.  Take over-the-counter and prescription medicines only as told by your dentist.  To prevent an abscess, take good care of your teeth. Brush your teeth every morning and night. Use floss every day.  Get dental cleanings as often as told by your dentist. This information is not intended to replace advice given to you by your health care provider. Make sure you discuss any questions you have with your health care provider. Document Revised: 08/13/2018 Document Reviewed: 12/24/2016 Elsevier Patient Education  2020 Elsevier Inc.  

## 2020-05-09 NOTE — MAU Provider Note (Signed)
  History     CSN: [id number]  Arrival date and time: 05/09/20 8563      Chief Complaint  Patient presents with  . Dental Pain  . Abdominal Pain   HPI This is a 24yo [id number] at [redacted]w[redacted]d. Seen for dental pain on the left lower jaw that has been increasing over the past several days. Has purulent drainage. No fevers, chills, nausea.  OB History    Gravida  6   Para  4   Term  4   Preterm  0   AB  1   Living  4     SAB  0   IAB  0   Ectopic  0   Multiple  0   Live Births  4           Past Medical History:  Diagnosis Date  . Anemia   . Chlamydia vaginitis/cervicitis 01/13/2015  . Medical history non-contributory   . Patient denies medical problems     Past Surgical History:  Procedure Laterality Date  . CESAREAN SECTION    . ORIF MANDIBULAR FRACTURE N/A 04/15/2018   Procedure: OPEN REDUCTION INTERNAL FIXATION (ORIF) MANDIBULAR FRACTURE;  Surgeon: Diona Browner, DDS;  Location: [facility];  Service: Oral Surgery;  Laterality: N/A;    No family history on file.  Social History   Tobacco Use  . Smoking status: Never Smoker  . Smokeless tobacco: Never Used  Vaping Use  . Vaping Use: Never used  Substance Use Topics  . Alcohol use: Not Currently    Comment: socially   . Drug use: No    Allergies: No Known Allergies  Medications Prior to Admission  Medication Sig Dispense Refill Last Dose  . Blood Pressure Monitoring (BLOOD PRESSURE KIT) DEVI 1 Device by Does not apply route as needed. ICD 10: Z34.90 1 Device 0   . cephALEXin (KEFLEX) 500 MG capsule Take 1 capsule (500 mg total) by mouth 4 (four) times daily. 28 capsule 0   . ibuprofen (ADVIL) 600 MG tablet Take 1 tablet (600 mg total) by mouth every 6 (six) hours. (Patient not taking: Reported on 11/18/2019) 30 tablet 0   . norgestimate-ethinyl estradiol (ORTHO-CYCLEN) 0.25-35 MG-MCG tablet Take 1 tablet by mouth daily. (Patient not taking: Reported on 11/18/2019) 1 Package 11   . Prenatal Vit-Fe  Fumarate-FA (PRENATAL COMPLETE) 14-0.4 MG TABS Take 1 tablet by mouth daily. (Patient not taking: Reported on 11/18/2019) 60 tablet 0     Review of Systems Physical Exam   Blood pressure 119/76, pulse 92, temperature 98.6 F (37 C), temperature source Oral, resp. rate 16, height $RemoveBe'5\' 3"'NbWNgbuQc$  (1.6 m), weight 69 kg, SpO2 100 %, unknown if currently breastfeeding.  Physical Exam Vitals reviewed.  Constitutional:      Appearance: She is well-developed.  HENT:     Head: Normocephalic and atraumatic.     Mouth/Throat:   Abdominal:     General: Abdomen is flat.     Palpations: Abdomen is soft.  Neurological:     Mental Status: She is alert.     MAU Course  Procedures  MDM   Assessment and Plan     ICD-10-CM   1. [redacted] weeks gestation of pregnancy  Z3A.18   2. Dental infection  K04.7    Amoxicillin $RemoveBefo'875mg'MmPkrYaXPvN$  BID x10 days Letter given for dental care F/u if having fevers, chills, trismus  Truett Mainland 05/09/2020, 9:51 AM

## 2020-05-12 ENCOUNTER — Other Ambulatory Visit: Payer: Self-pay

## 2020-05-12 ENCOUNTER — Encounter: Payer: Medicaid Other | Admitting: Obstetrics & Gynecology

## 2020-06-13 ENCOUNTER — Encounter: Payer: Medicaid Other | Admitting: Obstetrics & Gynecology

## 2020-06-21 ENCOUNTER — Other Ambulatory Visit: Payer: Self-pay

## 2020-06-21 ENCOUNTER — Ambulatory Visit (INDEPENDENT_AMBULATORY_CARE_PROVIDER_SITE_OTHER): Payer: Medicaid Other | Admitting: Family Medicine

## 2020-06-21 ENCOUNTER — Encounter: Payer: Self-pay | Admitting: Family Medicine

## 2020-06-21 ENCOUNTER — Other Ambulatory Visit (HOSPITAL_COMMUNITY)
Admission: RE | Admit: 2020-06-21 | Discharge: 2020-06-21 | Disposition: A | Discharge status: Home / Self Care | Payer: Medicaid Other | Source: Ambulatory Visit | Attending: Obstetrics & Gynecology | Admitting: Obstetrics & Gynecology

## 2020-06-21 VITALS — BP 124/79 | HR 82 | Wt 163.0 lb

## 2020-06-21 DIAGNOSIS — O0990 Supervision of high risk pregnancy, unspecified, unspecified trimester: Secondary | ICD-10-CM

## 2020-06-21 DIAGNOSIS — Z98891 History of uterine scar from previous surgery: Secondary | ICD-10-CM

## 2020-06-21 DIAGNOSIS — O34219 Maternal care for unspecified type scar from previous cesarean delivery: Secondary | ICD-10-CM

## 2020-06-21 MED ORDER — BLOOD PRESSURE KIT DEVI: 1.0000 | Freq: Every day | 0 refills | Status: DC

## 2020-06-21 MED ORDER — PREPLUS 27-1 MG PO TABS: 1.0000 | ORAL_TABLET | Freq: Every day | ORAL | 6 refills | Status: DC

## 2020-06-21 NOTE — Progress Notes (Signed)
Subjective:   Samantha Hood is a 25 y.o. [id number] at [redacted]w[redacted]d by LMP (irregular prior to conceiving but has 11 week [place]) being seen today for her first obstetrical visit.  Her obstetrical history is significant for non-compliance and grand multiparity. Patient does not intend to breast feed. Pregnancy history fully reviewed.  Patient reports no complaints.  HISTORY: OB History  Gravida Para Term Preterm AB Living  6 4 4  0 1 4  SAB IAB Ectopic Multiple Live Births  0 0 0 0 4    # Outcome Date GA Lbr Len/2nd Weight Sex Delivery Anes PTL Lv  6 Current           5 Term 06/21/19 [redacted]w[redacted]d 12:50 / 00:09 6 lb 6.1 oz (2.895 kg) F Vag-Spont EPI  LIV     Name: ISSABELA, LESKO     Apgar1: 8  Apgar5: 9  4 AB 2019          3 Term 11/28/16 [redacted]w[redacted]d  7 lb 10 oz (3.459 kg) M VBAC EPI  LIV     Birth Comments: no complications  2 Term 05/16/15 [redacted]w[redacted]d  5 lb 6 oz (2.438 kg) F CS-LTranv EPI  LIV     Birth Comments: no complications     Complications: Fetal distress affecting care  1 Term 12/12/12 [redacted]w[redacted]d  7 lb 1 oz (3.204 kg) M Vag-Spont EPI N LIV     Birth Comments: no complications   Last pap smear was  never  Past Medical History:  Diagnosis Date  . Anemia   . Chlamydia vaginitis/cervicitis 01/13/2015  . Medical history non-contributory   . Patient denies medical problems    Past Surgical History:  Procedure Laterality Date  . CESAREAN SECTION    . ORIF MANDIBULAR FRACTURE N/A 04/15/2018   Procedure: OPEN REDUCTION INTERNAL FIXATION (ORIF) MANDIBULAR FRACTURE;  Surgeon: 14/02/2018, DDS;  Location: [facility];  Service: Oral Surgery;  Laterality: N/A;   No family history on file. Social History   Tobacco Use  . Smoking status: Never Smoker  . Smokeless tobacco: Never Used  Vaping Use  . Vaping Use: Never used  Substance Use Topics  . Alcohol use: Not Currently    Comment: socially   . Drug use: Not Currently    Types: Marijuana    Comment: "years since last use"   No Known Allergies No  current outpatient medications on file prior to visit.   No current facility-administered medications on file prior to visit.     Exam   Vitals:   06/21/20 0902  BP: 124/79  Pulse: 82  Weight: 163 lb (73.9 kg)   Fetal Heart Rate (bpm): 140  Uterus:     Pelvic Exam: Perineum: no hemorrhoids, normal perineum   Vulva: normal external genitalia, no lesions   Vagina:  normal mucosa, normal discharge   Cervix: no lesions and normal, pap smear done.    Bony Pelvis: average  System: General: well-developed, well-nourished female in no acute distress   Skin: normal coloration and turgor, no rashes   Neurologic: oriented, normal, negative, normal mood   Extremities: normal strength, tone, and muscle mass, ROM of all joints is normal   HEENT PERRLA, extraocular movement intact and sclera clear, anicteric   Neck supple and no masses   Respiratory:  no respiratory distress     Assessment:   Pregnancy: 06/23/20 Patient Active Problem List   Diagnosis Date Noted  . Previous cesarean section complicating pregnancy, antepartum  condition or complication 02/18/2019  . Supervision of high risk pregnancy, antepartum 01/30/2019  . History of VBAC 07/16/2016     Plan:  1. Supervision of high risk pregnancy, antepartum Initial labs drawn. Continue prenatal vitamins. Genetic Screening discussed, NIPS: ordered. Ultrasound discussed; fetal anatomic survey: ordered. Problem list reviewed and updated. Considering BTL, also discussed LARC, she will consider. Sign BTL consent at next visit if she decides on that.  The nature of [facility] with multiple MDs and other Advanced Practice Providers was explained to patient; also emphasized that residents, students are part of our team. - MFM US OB Comp + 14 Wk; Future - Cytology - PAP( [place]) - CBC/D/Plt+RPR+Rh+ABO+Rub Ab... - Culture, OB Urine  2. History of VBAC NSVD followed by CS for NRFHT, followed  by 2 successful VBAC Desires VBAC, counseled and consent signed  3. Previous cesarean section complicating pregnancy, antepartum condition or complication    Routine obstetric precautions reviewed. Return in 4 weeks (on 07/19/2020).

## 2020-06-21 NOTE — Patient Instructions (Signed)
 Second Trimester of Pregnancy  The second trimester of pregnancy is from week 13 through week 27. This is months 4 through 6 of pregnancy. The second trimester is often a time when you feel your best. Your body has adjusted to being pregnant, and you begin to feel better physically. During the second trimester:  Morning sickness has lessened or stopped completely.  You may have more energy.  You may have an increase in appetite. The second trimester is also a time when the unborn baby (fetus) is growing rapidly. At the end of the sixth month, the fetus may be up to 12 inches long and weigh about 1 pounds. You will likely begin to feel the baby move (quickening) between 16 and 20 weeks of pregnancy. Body changes during your second trimester Your body continues to go through many changes during your second trimester. The changes vary and generally return to normal after the baby is born. Physical changes  Your weight will continue to increase. You will notice your lower abdomen bulging out.  You may begin to get stretch marks on your hips, abdomen, and breasts.  Your breasts will continue to grow and to become tender.  Dark spots or blotches (chloasma or mask of pregnancy) may develop on your face.  A dark line from your belly button to the pubic area (linea nigra) may appear.  You may have changes in your hair. These can include thickening of your hair, rapid growth, and changes in texture. Some people also have hair loss during or after pregnancy, or hair that feels dry or thin. Health changes  You may develop headaches.  You may have heartburn.  You may develop constipation.  You may develop hemorrhoids or swollen, bulging veins (varicose veins).  Your gums may bleed and may be sensitive to brushing and flossing.  You may urinate more often because the fetus is pressing on your bladder.  You may have back pain. This is caused by: ? Weight gain. ? Pregnancy hormones  that are relaxing the joints in your pelvis. ? A shift in weight and the muscles that support your balance. Follow these instructions at home: Medicines  Follow your health care provider's instructions regarding medicine use. Specific medicines may be either safe or unsafe to take during pregnancy. Do not take any medicines unless approved by your health care provider.  Take a prenatal vitamin that contains at least 600 micrograms (mcg) of folic acid. Eating and drinking  Eat a healthy diet that includes fresh fruits and vegetables, whole grains, good sources of protein such as meat, eggs, or tofu, and low-fat dairy products.  Avoid raw meat and unpasteurized juice, milk, and cheese. These carry germs that can harm you and your baby.  You may need to take these actions to prevent or treat constipation: ? Drink enough fluid to keep your urine pale yellow. ? Eat foods that are high in fiber, such as beans, whole grains, and fresh fruits and vegetables. ? Limit foods that are high in fat and processed sugars, such as fried or sweet foods. Activity  Exercise only as directed by your health care provider. Most people can continue their usual exercise routine during pregnancy. Try to exercise for 30 minutes at least 5 days a week. Stop exercising if you develop contractions in your uterus.  Stop exercising if you develop pain or cramping in the lower abdomen or lower back.  Avoid exercising if it is very hot or humid or if you are   at a high altitude.  Avoid heavy lifting.  If you choose to, you may have sex unless your health care provider tells you not to. Relieving pain and discomfort  Wear a supportive bra to prevent discomfort from breast tenderness.  Take warm sitz baths to soothe any pain or discomfort caused by hemorrhoids. Use hemorrhoid cream if your health care provider approves.  Rest with your legs raised (elevated) if you have leg cramps or low back pain.  If you develop  varicose veins: ? Wear support hose as told by your health care provider. ? Elevate your feet for 15 minutes, 3-4 times a day. ? Limit salt in your diet. Safety  Wear your seat belt at all times when driving or riding in a car.  Talk with your health care provider if someone is verbally or physically abusive to you. Lifestyle  Do not use hot tubs, steam rooms, or saunas.  Do not douche. Do not use tampons or scented sanitary pads.  Avoid cat litter boxes and soil used by cats. These carry germs that can cause birth defects in the baby and possibly loss of the fetus by miscarriage or stillbirth.  Do not use herbal remedies, alcohol, illegal drugs, or medicines that are not approved by your health care provider. Chemicals in these products can harm your baby.  Do not use any products that contain nicotine or tobacco, such as cigarettes, e-cigarettes, and chewing tobacco. If you need help quitting, ask your health care provider. General instructions  During a routine prenatal visit, your health care provider will do a physical exam and other tests. He or she will also discuss your overall health. Keep all follow-up visits. This is important.  Ask your health care provider for a referral to a local prenatal education class.  Ask for help if you have counseling or nutritional needs during pregnancy. Your health care provider can offer advice or refer you to specialists for help with various needs. Where to find more information  American Pregnancy Association: americanpregnancy.org  American College of Obstetricians and Gynecologists: acog.org/en/Womens%20Health/Pregnancy  Office on Women's Health: womenshealth.gov/pregnancy Contact a health care provider if you have:  A headache that does not go away when you take medicine.  Vision changes or you see spots in front of your eyes.  Mild pelvic cramps, pelvic pressure, or nagging pain in the abdominal area.  Persistent nausea,  vomiting, or diarrhea.  A bad-smelling vaginal discharge or foul-smelling urine.  Pain when you urinate.  Sudden or extreme swelling of your face, hands, ankles, feet, or legs.  A fever. Get help right away if you:  Have fluid leaking from your vagina.  Have spotting or bleeding from your vagina.  Have severe abdominal cramping or pain.  Have difficulty breathing.  Have chest pain.  Have fainting spells.  Have not felt your baby move for the time period told by your health care provider.  Have new or increased pain, swelling, or redness in an arm or leg. Summary  The second trimester of pregnancy is from week 13 through week 27 (months 4 through 6).  Do not use herbal remedies, alcohol, illegal drugs, or medicines that are not approved by your health care provider. Chemicals in these products can harm your baby.  Exercise only as directed by your health care provider. Most people can continue their usual exercise routine during pregnancy.  Keep all follow-up visits. This is important. This information is not intended to replace advice given to you by   your health care provider. Make sure you discuss any questions you have with your health care provider. Document Revised: 09/30/2019 Document Reviewed: 08/06/2019 Elsevier Patient Education  2021 [employer]   Contraception Choices Contraception, also called birth control, refers to methods or devices that prevent pregnancy. Hormonal methods Contraceptive implant A contraceptive implant is a thin, plastic tube that contains a hormone that prevents pregnancy. It is different from an intrauterine device (IUD). It is inserted into the upper part of the arm by a health care provider. Implants can be effective for up to 3 years. Progestin-only injections Progestin-only injections are injections of progestin, a synthetic form of the hormone progesterone. They are given every 3 months by a health care provider. Birth control  pills Birth control pills are pills that contain hormones that prevent pregnancy. They must be taken once a day, preferably at the same time each day. A prescription is needed to use this method of contraception. Birth control patch The birth control patch contains hormones that prevent pregnancy. It is placed on the skin and must be changed once a week for three weeks and removed on the fourth week. A prescription is needed to use this method of contraception. Vaginal ring A vaginal ring contains hormones that prevent pregnancy. It is placed in the vagina for three weeks and removed on the fourth week. After that, the process is repeated with a new ring. A prescription is needed to use this method of contraception. Emergency contraceptive Emergency contraceptives prevent pregnancy after unprotected sex. They come in pill form and can be taken up to 5 days after sex. They work best the sooner they are taken after having sex. Most emergency contraceptives are available without a prescription. This method should not be used as your only form of birth control.   Barrier methods Female condom A female condom is a thin sheath that is worn over the penis during sex. Condoms keep sperm from going inside a woman's body. They can be used with a sperm-killing substance (spermicide) to increase their effectiveness. They should be thrown away after one use. Female condom A female condom is a soft, loose-fitting sheath that is put into the vagina before sex. The condom keeps sperm from going inside a woman's body. They should be thrown away after one use. Diaphragm A diaphragm is a soft, dome-shaped barrier. It is inserted into the vagina before sex, along with a spermicide. The diaphragm blocks sperm from entering the uterus, and the spermicide kills sperm. A diaphragm should be left in the vagina for 6-8 hours after sex and removed within 24 hours. A diaphragm is prescribed and fitted by a health care provider. A  diaphragm should be replaced every 1-2 years, after giving birth, after gaining more than 15 lb (6.8 kg), and after pelvic surgery. Cervical cap A cervical cap is a round, soft latex or plastic cup that fits over the cervix. It is inserted into the vagina before sex, along with spermicide. It blocks sperm from entering the uterus. The cap should be left in place for 6-8 hours after sex and removed within 48 hours. A cervical cap must be prescribed and fitted by a health care provider. It should be replaced every 2 years. Sponge A sponge is a soft, circular piece of polyurethane foam with spermicide in it. The sponge helps block sperm from entering the uterus, and the spermicide kills sperm. To use it, you make it wet and then insert it into the vagina. It should be   inserted before sex, left in for at least 6 hours after sex, and removed and thrown away within 30 hours. Spermicides Spermicides are chemicals that kill or block sperm from entering the cervix and uterus. They can come as a cream, jelly, suppository, foam, or tablet. A spermicide should be inserted into the vagina with an applicator at least 10-15 minutes before sex to allow time for it to work. The process must be repeated every time you have sex. Spermicides do not require a prescription.   Intrauterine contraception Intrauterine device (IUD) An IUD is a T-shaped device that is put in a woman's uterus. There are two types:  Hormone IUD.This type contains progestin, a synthetic form of the hormone progesterone. This type can stay in place for 3-5 years.  Copper IUD.This type is wrapped in copper wire. It can stay in place for 10 years. Permanent methods of contraception Female tubal ligation In this method, a woman's fallopian tubes are sealed, tied, or blocked during surgery to prevent eggs from traveling to the uterus. Hysteroscopic sterilization In this method, a small, flexible insert is placed into each fallopian tube. The inserts  cause scar tissue to form in the fallopian tubes and block them, so sperm cannot reach an egg. The procedure takes about 3 months to be effective. Another form of birth control must be used during those 3 months. Female sterilization This is a procedure to tie off the tubes that carry sperm (vasectomy). After the procedure, the man can still ejaculate fluid (semen). Another form of birth control must be used for 3 months after the procedure. Natural planning methods Natural family planning In this method, a couple does not have sex on days when the woman could become pregnant. Calendar method In this method, the woman keeps track of the length of each menstrual cycle, identifies the days when pregnancy can happen, and does not have sex on those days. Ovulation method In this method, a couple avoids sex during ovulation. Symptothermal method This method involves not having sex during ovulation. The woman typically checks for ovulation by watching changes in her temperature and in the consistency of cervical mucus. Post-ovulation method In this method, a couple waits to have sex until after ovulation. Where to find more information  Centers for Disease Control and Prevention: www.cdc.gov Summary  Contraception, also called birth control, refers to methods or devices that prevent pregnancy.  Hormonal methods of contraception include implants, injections, pills, patches, vaginal rings, and emergency contraceptives.  Barrier methods of contraception can include female condoms, female condoms, diaphragms, cervical caps, sponges, and spermicides.  There are two types of IUDs (intrauterine devices). An IUD can be put in a woman's uterus to prevent pregnancy for 3-5 years.  Permanent sterilization can be done through a procedure for males and females. Natural family planning methods involve nothaving sex on days when the woman could become pregnant. This information is not intended to replace advice  given to you by your health care provider. Make sure you discuss any questions you have with your health care provider. Document Revised: 09/28/2019 Document Reviewed: 09/28/2019 Elsevier Patient Education  2021 [employer] Inc.   Breastfeeding  Choosing to breastfeed is one of the best decisions you can make for yourself and your baby. A change in hormones during pregnancy causes your breasts to make breast milk in your milk-producing glands. Hormones prevent breast milk from being released before your baby is born. They also prompt milk flow after birth. Once breastfeeding has begun, thoughts of   your baby, as well as his or her sucking or crying, can stimulate the release of milk from your milk-producing glands. Benefits of breastfeeding Research shows that breastfeeding offers many health benefits for infants and mothers. It also offers a cost-free and convenient way to feed your baby. For your baby  Your first milk (colostrum) helps your baby's digestive system to function better.  Special cells in your milk (antibodies) help your baby to fight off infections.  Breastfed babies are less likely to develop asthma, allergies, obesity, or type 2 diabetes. They are also at lower risk for sudden infant death syndrome (SIDS).  Nutrients in breast milk are better able to meet your baby's needs compared to infant formula.  Breast milk improves your baby's brain development. For you  Breastfeeding helps to create a very special bond between you and your baby.  Breastfeeding is convenient. Breast milk costs nothing and is always available at the correct temperature.  Breastfeeding helps to burn calories. It helps you to lose the weight that you gained during pregnancy.  Breastfeeding makes your uterus return faster to its size before pregnancy. It also slows bleeding (lochia) after you give birth.  Breastfeeding helps to lower your risk of developing type 2 diabetes, osteoporosis, rheumatoid  arthritis, cardiovascular disease, and breast, ovarian, uterine, and endometrial cancer later in life. Breastfeeding basics Starting breastfeeding  Find a comfortable place to sit or lie down, with your neck and back well-supported.  Place a pillow or a rolled-up blanket under your baby to bring him or her to the level of your breast (if you are seated). Nursing pillows are specially designed to help support your arms and your baby while you breastfeed.  Make sure that your baby's tummy (abdomen) is facing your abdomen.  Gently massage your breast. With your fingertips, massage from the outer edges of your breast inward toward the nipple. This encourages milk flow. If your milk flows slowly, you may need to continue this action during the feeding.  Support your breast with 4 fingers underneath and your thumb above your nipple (make the letter "C" with your hand). Make sure your fingers are well away from your nipple and your baby's mouth.  Stroke your baby's lips gently with your finger or nipple.  When your baby's mouth is open wide enough, quickly bring your baby to your breast, placing your entire nipple and as much of the areola as possible into your baby's mouth. The areola is the colored area around your nipple. ? More areola should be visible above your baby's upper lip than below the lower lip. ? Your baby's lips should be opened and extended outward (flanged) to ensure an adequate, comfortable latch. ? Your baby's tongue should be between his or her lower gum and your breast.  Make sure that your baby's mouth is correctly positioned around your nipple (latched). Your baby's lips should create a seal on your breast and be turned out (everted).  It is common for your baby to suck about 2-3 minutes in order to start the flow of breast milk. Latching Teaching your baby how to latch onto your breast properly is very important. An improper latch can cause nipple pain, decreased milk  supply, and poor weight gain in your baby. Also, if your baby is not latched onto your nipple properly, he or she may swallow some air during feeding. This can make your baby fussy. Burping your baby when you switch breasts during the feeding can help to get rid   of the air. However, teaching your baby to latch on properly is still the best way to prevent fussiness from swallowing air while breastfeeding. Signs that your baby has successfully latched onto your nipple  Silent tugging or silent sucking, without causing you pain. Infant's lips should be extended outward (flanged).  Swallowing heard between every 3-4 sucks once your milk has started to flow (after your let-down milk reflex occurs).  Muscle movement above and in front of his or her ears while sucking. Signs that your baby has not successfully latched onto your nipple  Sucking sounds or smacking sounds from your baby while breastfeeding.  Nipple pain. If you think your baby has not latched on correctly, slip your finger into the corner of your baby's mouth to break the suction and place it between your baby's gums. Attempt to start breastfeeding again. Signs of successful breastfeeding Signs from your baby  Your baby will gradually decrease the number of sucks or will completely stop sucking.  Your baby will fall asleep.  Your baby's body will relax.  Your baby will retain a small amount of milk in his or her mouth.  Your baby will let go of your breast by himself or herself. Signs from you  Breasts that have increased in firmness, weight, and size 1-3 hours after feeding.  Breasts that are softer immediately after breastfeeding.  Increased milk volume, as well as a change in milk consistency and color by the fifth day of breastfeeding.  Nipples that are not sore, cracked, or bleeding. Signs that your baby is getting enough milk  Wetting at least 1-2 diapers during the first 24 hours after birth.  Wetting at least 5-6  diapers every 24 hours for the first week after birth. The urine should be clear or pale yellow by the age of 5 days.  Wetting 6-8 diapers every 24 hours as your baby continues to grow and develop.  At least 3 stools in a 24-hour period by the age of 5 days. The stool should be soft and yellow.  At least 3 stools in a 24-hour period by the age of 7 days. The stool should be seedy and yellow.  No loss of weight greater than 10% of birth weight during the first 3 days of life.  Average weight gain of 4-7 oz (113-198 g) per week after the age of 4 days.  Consistent daily weight gain by the age of 5 days, without weight loss after the age of 2 weeks. After a feeding, your baby may spit up a small amount of milk. This is normal. Breastfeeding frequency and duration Frequent feeding will help you make more milk and can prevent sore nipples and extremely full breasts (breast engorgement). Breastfeed when you feel the need to reduce the fullness of your breasts or when your baby shows signs of hunger. This is called "breastfeeding on demand." Signs that your baby is hungry include:  Increased alertness, activity, or restlessness.  Movement of the head from side to side.  Opening of the mouth when the corner of the mouth or cheek is stroked (rooting).  Increased sucking sounds, smacking lips, cooing, sighing, or squeaking.  Hand-to-mouth movements and sucking on fingers or hands.  Fussing or crying. Avoid introducing a pacifier to your baby in the first 4-6 weeks after your baby is born. After this time, you may choose to use a pacifier. Research has shown that pacifier use during the first year of a baby's life decreases the risk of   sudden infant death syndrome (SIDS). Allow your baby to feed on each breast as long as he or she wants. When your baby unlatches or falls asleep while feeding from the first breast, offer the second breast. Because newborns are often sleepy in the first few weeks of  life, you may need to awaken your baby to get him or her to feed. Breastfeeding times will vary from baby to baby. However, the following rules can serve as a guide to help you make sure that your baby is properly fed:  Newborns (babies 4 weeks of age or younger) may breastfeed every 1-3 hours.  Newborns should not go without breastfeeding for longer than 3 hours during the day or 5 hours during the night.  You should breastfeed your baby a minimum of 8 times in a 24-hour period. Breast milk pumping Pumping and storing breast milk allows you to make sure that your baby is exclusively fed your breast milk, even at times when you are unable to breastfeed. This is especially important if you go back to work while you are still breastfeeding, or if you are not able to be present during feedings. Your lactation consultant can help you find a method of pumping that works best for you and give you guidelines about how long it is safe to store breast milk.      Caring for your breasts while you breastfeed Nipples can become dry, cracked, and sore while breastfeeding. The following recommendations can help keep your breasts moisturized and healthy:  Avoid using soap on your nipples.  Wear a supportive bra designed especially for nursing. Avoid wearing underwire-style bras or extremely tight bras (sports bras).  Air-dry your nipples for 3-4 minutes after each feeding.  Use only cotton bra pads to absorb leaked breast milk. Leaking of breast milk between feedings is normal.  Use lanolin on your nipples after breastfeeding. Lanolin helps to maintain your skin's normal moisture barrier. Pure lanolin is not harmful (not toxic) to your baby. You may also hand express a few drops of breast milk and gently massage that milk into your nipples and allow the milk to air-dry. In the first few weeks after giving birth, some women experience breast engorgement. Engorgement can make your breasts feel heavy, warm,  and tender to the touch. Engorgement peaks within 3-5 days after you give birth. The following recommendations can help to ease engorgement:  Completely empty your breasts while breastfeeding or pumping. You may want to start by applying warm, moist heat (in the shower or with warm, water-soaked hand towels) just before feeding or pumping. This increases circulation and helps the milk flow. If your baby does not completely empty your breasts while breastfeeding, pump any extra milk after he or she is finished.  Apply ice packs to your breasts immediately after breastfeeding or pumping, unless this is too uncomfortable for you. To do this: ? Put ice in a plastic bag. ? Place a towel between your skin and the bag. ? Leave the ice on for 20 minutes, 2-3 times a day.  Make sure that your baby is latched on and positioned properly while breastfeeding. If engorgement persists after 48 hours of following these recommendations, contact your health care provider or a lactation consultant. Overall health care recommendations while breastfeeding  Eat 3 healthy meals and 3 snacks every day. Well-nourished mothers who are breastfeeding need an additional 450-500 calories a day. You can meet this requirement by increasing the amount of a balanced diet that   you eat.  Drink enough water to keep your urine pale yellow or clear.  Rest often, relax, and continue to take your prenatal vitamins to prevent fatigue, stress, and low vitamin and mineral levels in your body (nutrient deficiencies).  Do not use any products that contain nicotine or tobacco, such as cigarettes and e-cigarettes. Your baby may be harmed by chemicals from cigarettes that pass into breast milk and exposure to secondhand smoke. If you need help quitting, ask your health care provider.  Avoid alcohol.  Do not use illegal drugs or marijuana.  Talk with your health care provider before taking any medicines. These include over-the-counter and  prescription medicines as well as vitamins and herbal supplements. Some medicines that may be harmful to your baby can pass through breast milk.  It is possible to become pregnant while breastfeeding. If birth control is desired, ask your health care provider about options that will be safe while breastfeeding your baby. Where to find more information: [employer]: www.llli.org Contact a health care provider if:  You feel like you want to stop breastfeeding or have become frustrated with breastfeeding.  Your nipples are cracked or bleeding.  Your breasts are red, tender, or warm.  You have: ? Painful breasts or nipples. ? A swollen area on either breast. ? A fever or chills. ? Nausea or vomiting. ? Drainage other than breast milk from your nipples.  Your breasts do not become full before feedings by the fifth day after you give birth.  You feel sad and depressed.  Your baby is: ? Too sleepy to eat well. ? Having trouble sleeping. ? More than 1 week old and wetting fewer than 6 diapers in a 24-hour period. ? Not gaining weight by 5 days of age.  Your baby has fewer than 3 stools in a 24-hour period.  Your baby's skin or the white parts of his or her eyes become yellow. Get help right away if:  Your baby is overly tired (lethargic) and does not want to wake up and feed.  Your baby develops an unexplained fever. Summary  Breastfeeding offers many health benefits for infant and mothers.  Try to breastfeed your infant when he or she shows early signs of hunger.  Gently tickle or stroke your baby's lips with your finger or nipple to allow the baby to open his or her mouth. Bring the baby to your breast. Make sure that much of the areola is in your baby's mouth. Offer one side and burp the baby before you offer the other side.  Talk with your health care provider or lactation consultant if you have questions or you face problems as you breastfeed. This  information is not intended to replace advice given to you by your health care provider. Make sure you discuss any questions you have with your health care provider. Document Revised: 07/18/2017 Document Reviewed: 05/25/2016 Elsevier Patient Education  2021 Elsevier Inc.  

## 2020-06-21 NOTE — Progress Notes (Signed)
VBAC consent signed by provider and pt today.

## 2020-06-22 ENCOUNTER — Encounter: Payer: Self-pay | Admitting: Family Medicine

## 2020-06-22 DIAGNOSIS — O26899 Other specified pregnancy related conditions, unspecified trimester: Secondary | ICD-10-CM | POA: Insufficient documentation

## 2020-06-22 DIAGNOSIS — Z6791 Unspecified blood type, Rh negative: Secondary | ICD-10-CM | POA: Insufficient documentation

## 2020-06-22 LAB — CBC/D/PLT+RPR+RH+ABO+RUB AB...
Antibody Screen: NEGATIVE
Basophils Absolute: 0 10*3/uL (ref 0.0–0.2)
Basos: 0 %
EOS (ABSOLUTE): 0.1 10*3/uL (ref 0.0–0.4)
Eos: 1 %
Hematocrit: 36.1 % (ref 34.0–46.6)
Hemoglobin: 12.2 g/dL (ref 11.1–15.9)
Hepatitis B Surface Ag: NEGATIVE
Immature Grans (Abs): 0 10*3/uL (ref 0.0–0.1)
Immature Granulocytes: 0 %
Lymphocytes Absolute: 1.9 10*3/uL (ref 0.7–3.1)
Lymphs: 24 %
MCH: 28 pg (ref 26.6–33.0)
MCHC: 33.8 g/dL (ref 31.5–35.7)
MCV: 83 fL (ref 79–97)
Monocytes Absolute: 0.5 10*3/uL (ref 0.1–0.9)
Monocytes: 6 %
Neutrophils Absolute: 5.4 10*3/uL (ref 1.4–7.0)
Neutrophils: 69 %
Platelets: 203 10*3/uL (ref 150–450)
RBC: 4.36 x10E6/uL (ref 3.77–5.28)
RDW: 12.5 % (ref 11.7–15.4)
RPR Ser Ql: NONREACTIVE
Rh Factor: NEGATIVE
Rubella Antibodies, IGG: 1.92 index (ref >0.99)
Varicella zoster IgG: 1745 index (ref >165)
WBC: 7.9 10*3/uL (ref 3.4–10.8)

## 2020-06-22 LAB — HEMOGLOBIN A1C
Est. average glucose Bld gHb Est-mCnc: 97 mg/dL
Hgb A1c MFr Bld: 5 % (ref 4.8–5.6)

## 2020-06-23 LAB — CULTURE, OB URINE

## 2020-06-23 LAB — URINE CULTURE, OB REFLEX

## 2020-06-24 LAB — CYTOLOGY - PAP
Chlamydia: NEGATIVE
Comment: NEGATIVE
Comment: NEGATIVE
Comment: NEGATIVE
Comment: NORMAL
High risk HPV: POSITIVE — AB
Neisseria Gonorrhea: NEGATIVE
Trichomonas: NEGATIVE

## 2020-06-25 ENCOUNTER — Encounter: Payer: Self-pay | Admitting: Family Medicine

## 2020-06-25 DIAGNOSIS — R87612 Low grade squamous intraepithelial lesion on cytologic smear of cervix (LGSIL): Secondary | ICD-10-CM | POA: Insufficient documentation

## 2020-06-27 ENCOUNTER — Telehealth: Payer: Self-pay | Admitting: Lactation Services

## 2020-06-27 NOTE — Telephone Encounter (Signed)
Returned patients call at her request. 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   Reviewed with patient that there is no treatment and to make sure she takes good car of herself. Reviewed that most cases clear on their own in 1-2 years and repeat paps will give this information.   Reviewed that she can get the HPV vaccines when not pregnant to help with clearing and preventing future cases.   Reviewed that a few cases can go on to cause worsening cervical changes and cancer and that follow up is very important.   Patient asking for results of Horizon/Panorama, results not yet in the office. Patient voiced understanding.   Patient to call with any further questions or concerns.

## 2020-06-27 NOTE — Telephone Encounter (Signed)
Called patient at her request. Phone went straight to voicemail. LM for patient to call the office at her convenience to address her concerns.

## 2020-06-29 ENCOUNTER — Other Ambulatory Visit: Payer: Self-pay

## 2020-06-29 ENCOUNTER — Other Ambulatory Visit: Payer: Self-pay | Admitting: Family Medicine

## 2020-06-29 ENCOUNTER — Ambulatory Visit: Payer: Medicaid Other | Attending: Family Medicine

## 2020-06-29 ENCOUNTER — Other Ambulatory Visit: Payer: Self-pay | Admitting: *Deleted

## 2020-06-29 DIAGNOSIS — O0990 Supervision of high risk pregnancy, unspecified, unspecified trimester: Secondary | ICD-10-CM | POA: Diagnosis not present

## 2020-06-29 DIAGNOSIS — Z362 Encounter for other antenatal screening follow-up: Secondary | ICD-10-CM

## 2020-07-01 ENCOUNTER — Encounter: Payer: Self-pay | Admitting: *Deleted

## 2020-07-19 ENCOUNTER — Encounter: Payer: Medicaid Other | Admitting: Family Medicine

## 2020-07-25 ENCOUNTER — Encounter: Payer: Self-pay | Admitting: *Deleted

## 2020-07-27 ENCOUNTER — Ambulatory Visit: Payer: No Typology Code available for payment source | Attending: Obstetrics

## 2020-09-16 ENCOUNTER — Encounter: Payer: Medicaid Other | Admitting: Obstetrics & Gynecology

## 2020-09-29 ENCOUNTER — Encounter: Payer: Self-pay | Admitting: Obstetrics and Gynecology

## 2020-09-29 DIAGNOSIS — O0990 Supervision of high risk pregnancy, unspecified, unspecified trimester: Secondary | ICD-10-CM

## 2020-09-30 ENCOUNTER — Encounter: Payer: Self-pay | Admitting: Family Medicine

## 2020-10-05 ENCOUNTER — Encounter: Payer: Self-pay | Admitting: Obstetrics and Gynecology

## 2020-10-05 ENCOUNTER — Ambulatory Visit (INDEPENDENT_AMBULATORY_CARE_PROVIDER_SITE_OTHER): Payer: Self-pay | Admitting: Obstetrics and Gynecology

## 2020-10-05 ENCOUNTER — Other Ambulatory Visit: Payer: Self-pay

## 2020-10-05 ENCOUNTER — Other Ambulatory Visit (HOSPITAL_COMMUNITY)
Admission: RE | Admit: 2020-10-05 | Discharge: 2020-10-05 | Disposition: A | Discharge status: Home / Self Care | Payer: Self-pay | Source: Ambulatory Visit | Attending: Obstetrics and Gynecology | Admitting: Obstetrics and Gynecology

## 2020-10-05 VITALS — BP 140/83 | HR 77 | Wt 184.0 lb

## 2020-10-05 DIAGNOSIS — O0990 Supervision of high risk pregnancy, unspecified, unspecified trimester: Secondary | ICD-10-CM | POA: Insufficient documentation

## 2020-10-05 DIAGNOSIS — O26899 Other specified pregnancy related conditions, unspecified trimester: Secondary | ICD-10-CM

## 2020-10-05 DIAGNOSIS — Z6791 Unspecified blood type, Rh negative: Secondary | ICD-10-CM

## 2020-10-05 DIAGNOSIS — Z98891 History of uterine scar from previous surgery: Secondary | ICD-10-CM

## 2020-10-05 DIAGNOSIS — O34219 Maternal care for unspecified type scar from previous cesarean delivery: Secondary | ICD-10-CM

## 2020-10-05 NOTE — Progress Notes (Signed)
Subjective:  Samantha Hood is a 25 y.o. [id number] at [redacted]w[redacted]d being seen today for ongoing prenatal care.  She is currently monitored for the following issues for this high-risk pregnancy and has History of VBAC; Supervision of high risk pregnancy, antepartum; Previous cesarean section complicating pregnancy, antepartum condition or complication; Rh negative state in antepartum period; and Pap smear abnormality of cervix with LGSIL, +HPV on their problem list.  Patient reports general discomforts of pregnancy.  Contractions: Irritability. Vag. Bleeding: None.  Movement: Present. Denies leaking of fluid.   The following portions of the patient's history were reviewed and updated as appropriate: allergies, current medications, past family history, past medical history, past social history, past surgical history and problem list. Problem list updated.  Objective:   Vitals:   10/05/20 1057  BP: 140/83  Pulse: 77  Weight: 184 lb (83.5 kg)    Fetal Status: Fetal Heart Rate (bpm): 143   Movement: Present     General:  Alert, oriented and cooperative. Patient is in no acute distress.  Skin: Skin is warm and dry. No rash noted.   Cardiovascular: Normal heart rate noted  Respiratory: Normal respiratory effort, no problems with respiration noted  Abdomen: Soft, gravid, appropriate for gestational age. Pain/Pressure: Present     Pelvic:  Cervical exam performed        Extremities: Normal range of motion.  Edema: None  Mental Status: Normal mood and affect. Normal behavior. Normal judgment and thought content.   Urinalysis:      Assessment and Plan:  Pregnancy: [id number] at [redacted]w[redacted]d  1. Supervision of high risk pregnancy, antepartum Labor precautions GBS/vaginal cultures today Has not been seen since 06/21/20. Importance of prenatal care reviewed  2. Previous cesarean section complicating pregnancy, antepartum condition or complication For TOLAC  3. History of VBAC TOLAC scheduled at 41  weeks  4. Rh negative state in antepartum period   Term labor symptoms and general obstetric precautions including but not limited to vaginal bleeding, contractions, leaking of fluid and fetal movement were reviewed in detail with the patient. Please refer to After Visit Summary for other counseling recommendations.  Return in about 1 week (around 10/12/2020) for OB visit, face to face, any provider, need BPP/NSt for post dates.   Hermina Staggers, MD

## 2020-10-05 NOTE — Patient Instructions (Signed)

## 2020-10-06 LAB — CERVICOVAGINAL ANCILLARY ONLY
Chlamydia: NEGATIVE
Comment: NEGATIVE
Comment: NORMAL
Neisseria Gonorrhea: NEGATIVE

## 2020-10-10 ENCOUNTER — Encounter (HOSPITAL_COMMUNITY): Payer: Self-pay | Admitting: *Deleted

## 2020-10-10 ENCOUNTER — Telehealth (HOSPITAL_COMMUNITY): Payer: Self-pay | Admitting: *Deleted

## 2020-10-10 LAB — CULTURE, BETA STREP (GROUP B ONLY): Strep Gp B Culture: NEGATIVE

## 2020-10-10 NOTE — Telephone Encounter (Signed)
Preadmission screen  

## 2020-10-12 ENCOUNTER — Other Ambulatory Visit: Payer: Self-pay | Admitting: Advanced Practice Midwife

## 2020-10-13 ENCOUNTER — Telehealth: Payer: Self-pay | Admitting: Certified Nurse Midwife

## 2020-10-13 NOTE — Telephone Encounter (Signed)
Called pt to inform need to move IOL to 10/15/20, no answer and mailbox full.

## 2020-10-14 ENCOUNTER — Other Ambulatory Visit (HOSPITAL_COMMUNITY): Payer: Medicaid Other | Attending: Family Medicine

## 2020-10-14 ENCOUNTER — Ambulatory Visit (INDEPENDENT_AMBULATORY_CARE_PROVIDER_SITE_OTHER): Payer: Self-pay | Admitting: Obstetrics and Gynecology

## 2020-10-14 ENCOUNTER — Inpatient Hospital Stay (HOSPITAL_COMMUNITY)
Admission: AD | Admit: 2020-10-14 | Discharge: 2020-10-16 | DRG: 807 | Disposition: A | Discharge status: Home / Self Care | Payer: Medicaid Other | Attending: Obstetrics and Gynecology | Admitting: Obstetrics and Gynecology

## 2020-10-14 ENCOUNTER — Inpatient Hospital Stay (HOSPITAL_COMMUNITY): Payer: Medicaid Other | Admitting: Anesthesiology

## 2020-10-14 ENCOUNTER — Encounter (HOSPITAL_COMMUNITY): Payer: Self-pay | Admitting: Obstetrics and Gynecology

## 2020-10-14 ENCOUNTER — Other Ambulatory Visit: Payer: Self-pay

## 2020-10-14 ENCOUNTER — Ambulatory Visit: Payer: Self-pay | Admitting: *Deleted

## 2020-10-14 ENCOUNTER — Ambulatory Visit (INDEPENDENT_AMBULATORY_CARE_PROVIDER_SITE_OTHER): Payer: Self-pay

## 2020-10-14 VITALS — BP 143/95 | HR 85 | Wt 188.8 lb

## 2020-10-14 DIAGNOSIS — O139 Gestational [pregnancy-induced] hypertension without significant proteinuria, unspecified trimester: Secondary | ICD-10-CM | POA: Diagnosis present

## 2020-10-14 DIAGNOSIS — O480 Post-term pregnancy: Secondary | ICD-10-CM | POA: Diagnosis present

## 2020-10-14 DIAGNOSIS — R87612 Low grade squamous intraepithelial lesion on cytologic smear of cervix (LGSIL): Secondary | ICD-10-CM | POA: Diagnosis present

## 2020-10-14 DIAGNOSIS — Z8759 Personal history of other complications of pregnancy, childbirth and the puerperium: Secondary | ICD-10-CM | POA: Diagnosis present

## 2020-10-14 DIAGNOSIS — O0930 Supervision of pregnancy with insufficient antenatal care, unspecified trimester: Secondary | ICD-10-CM

## 2020-10-14 DIAGNOSIS — O134 Gestational [pregnancy-induced] hypertension without significant proteinuria, complicating childbirth: Secondary | ICD-10-CM | POA: Diagnosis present

## 2020-10-14 DIAGNOSIS — O0990 Supervision of high risk pregnancy, unspecified, unspecified trimester: Secondary | ICD-10-CM

## 2020-10-14 DIAGNOSIS — Z98891 History of uterine scar from previous surgery: Secondary | ICD-10-CM

## 2020-10-14 DIAGNOSIS — Z20822 Contact with and (suspected) exposure to covid-19: Secondary | ICD-10-CM | POA: Diagnosis present

## 2020-10-14 DIAGNOSIS — O26893 Other specified pregnancy related conditions, third trimester: Secondary | ICD-10-CM | POA: Diagnosis present

## 2020-10-14 DIAGNOSIS — O26899 Other specified pregnancy related conditions, unspecified trimester: Secondary | ICD-10-CM

## 2020-10-14 DIAGNOSIS — O34219 Maternal care for unspecified type scar from previous cesarean delivery: Secondary | ICD-10-CM | POA: Diagnosis present

## 2020-10-14 DIAGNOSIS — Z3A40 40 weeks gestation of pregnancy: Secondary | ICD-10-CM | POA: Diagnosis not present

## 2020-10-14 DIAGNOSIS — Z6791 Unspecified blood type, Rh negative: Secondary | ICD-10-CM

## 2020-10-14 DIAGNOSIS — O34211 Maternal care for low transverse scar from previous cesarean delivery: Secondary | ICD-10-CM | POA: Diagnosis not present

## 2020-10-14 DIAGNOSIS — O133 Gestational [pregnancy-induced] hypertension without significant proteinuria, third trimester: Secondary | ICD-10-CM

## 2020-10-14 DIAGNOSIS — O09899 Supervision of other high risk pregnancies, unspecified trimester: Secondary | ICD-10-CM

## 2020-10-14 LAB — COMPREHENSIVE METABOLIC PANEL
ALT: 14 U/L (ref 0–44)
AST: 21 U/L (ref 15–41)
Albumin: 2.8 g/dL — ABNORMAL LOW (ref 3.5–5.0)
Alkaline Phosphatase: 151 U/L — ABNORMAL HIGH (ref 38–126)
Anion gap: 9 (ref 5–15)
BUN: 6 mg/dL (ref 6–20)
CO2: 20 mmol/L — ABNORMAL LOW (ref 22–32)
Calcium: 8.7 mg/dL — ABNORMAL LOW (ref 8.9–10.3)
Chloride: 106 mmol/L (ref 98–111)
Creatinine, Ser: 0.55 mg/dL (ref 0.44–1.00)
GFR, Estimated: >60 mL/min (ref >60)
Glucose, Bld: 103 mg/dL — ABNORMAL HIGH (ref 70–99)
Potassium: 3.7 mmol/L (ref 3.5–5.1)
Sodium: 135 mmol/L (ref 135–145)
Total Bilirubin: 0.3 mg/dL (ref 0.3–1.2)
Total Protein: 6 g/dL — ABNORMAL LOW (ref 6.5–8.1)

## 2020-10-14 LAB — CBC
HCT: 37.3 % (ref 36.0–46.0)
Hemoglobin: 12.2 g/dL (ref 12.0–15.0)
MCH: 27.8 pg (ref 26.0–34.0)
MCHC: 32.7 g/dL (ref 30.0–36.0)
MCV: 85 fL (ref 80.0–100.0)
Platelets: 126 10*3/uL — ABNORMAL LOW (ref 150–400)
RBC: 4.39 MIL/uL (ref 3.87–5.11)
RDW: 15.9 % — ABNORMAL HIGH (ref 11.5–15.5)
WBC: 9.7 10*3/uL (ref 4.0–10.5)
nRBC: 0 % (ref 0.0–0.2)

## 2020-10-14 LAB — RESP PANEL BY RT-PCR (FLU A&B, COVID) ARPGX2
Influenza A by PCR: NEGATIVE
Influenza B by PCR: NEGATIVE
SARS Coronavirus 2 by RT PCR: NEGATIVE

## 2020-10-14 LAB — TYPE AND SCREEN
ABO/RH(D): B NEG
Antibody Screen: NEGATIVE

## 2020-10-14 MED ORDER — SOD CITRATE-CITRIC ACID 500-334 MG/5ML PO SOLN: 30.0000 mL | ORAL | Status: DC | PRN

## 2020-10-14 MED ORDER — FENTANYL-BUPIVACAINE-NACL 0.5-0.125-0.9 MG/250ML-% EP SOLN
12.0000 mL/h | EPIDURAL | Status: DC | PRN
  Administered 2020-10-14: 12 mL/h via EPIDURAL
  Filled 2020-10-14: qty 250

## 2020-10-14 MED ORDER — ACETAMINOPHEN 325 MG PO TABS: 650.0000 mg | ORAL_TABLET | ORAL | Status: DC | PRN

## 2020-10-14 MED ORDER — EPHEDRINE 5 MG/ML INJ: 10.0000 mg | INTRAVENOUS | Status: DC | PRN

## 2020-10-14 MED ORDER — LACTATED RINGERS IV SOLN: INTRAVENOUS | Status: DC

## 2020-10-14 MED ORDER — TERBUTALINE SULFATE 1 MG/ML IJ SOLN
0.2500 mg | Freq: Once | INTRAMUSCULAR | Status: AC | PRN
  Administered 2020-10-14: 0.25 mg via SUBCUTANEOUS
  Filled 2020-10-14: qty 1

## 2020-10-14 MED ORDER — OXYCODONE-ACETAMINOPHEN 5-325 MG PO TABS: 2.0000 | ORAL_TABLET | ORAL | Status: DC | PRN

## 2020-10-14 MED ORDER — LACTATED RINGERS IV SOLN
500.0000 mL | Freq: Once | INTRAVENOUS | Status: AC
  Administered 2020-10-14: 500 mL via INTRAVENOUS

## 2020-10-14 MED ORDER — LIDOCAINE HCL (PF) 1 % IJ SOLN: 30.0000 mL | INTRAMUSCULAR | Status: DC | PRN

## 2020-10-14 MED ORDER — OXYTOCIN-SODIUM CHLORIDE 30-0.9 UT/500ML-% IV SOLN
2.5000 [IU]/h | INTRAVENOUS | Status: DC
  Administered 2020-10-15: 2.5 [IU]/h via INTRAVENOUS

## 2020-10-14 MED ORDER — OXYTOCIN-SODIUM CHLORIDE 30-0.9 UT/500ML-% IV SOLN: 1.0000 m[IU]/min | INTRAVENOUS | Status: DC

## 2020-10-14 MED ORDER — OXYTOCIN-SODIUM CHLORIDE 30-0.9 UT/500ML-% IV SOLN
1.0000 m[IU]/min | INTRAVENOUS | Status: DC
  Administered 2020-10-14 – 2020-10-15 (×2): 2 m[IU]/min via INTRAVENOUS
  Filled 2020-10-14: qty 500

## 2020-10-14 MED ORDER — LIDOCAINE HCL (PF) 1 % IJ SOLN
INTRAMUSCULAR | Status: DC | PRN
  Administered 2020-10-14: 5 mL via EPIDURAL
  Administered 2020-10-14: 3 mL via EPIDURAL

## 2020-10-14 MED ORDER — PHENYLEPHRINE 40 MCG/ML (10ML) SYRINGE FOR IV PUSH (FOR BLOOD PRESSURE SUPPORT)
80.0000 ug | PREFILLED_SYRINGE | INTRAVENOUS | Status: DC | PRN
  Filled 2020-10-14: qty 10

## 2020-10-14 MED ORDER — OXYCODONE-ACETAMINOPHEN 5-325 MG PO TABS: 1.0000 | ORAL_TABLET | ORAL | Status: DC | PRN

## 2020-10-14 MED ORDER — OXYTOCIN BOLUS FROM INFUSION
333.0000 mL | Freq: Once | INTRAVENOUS | Status: AC
  Administered 2020-10-15: 333 mL via INTRAVENOUS

## 2020-10-14 MED ORDER — LACTATED RINGERS IV SOLN: 500.0000 mL | INTRAVENOUS | Status: DC | PRN

## 2020-10-14 MED ORDER — ONDANSETRON HCL 4 MG/2ML IJ SOLN
4.0000 mg | Freq: Four times a day (QID) | INTRAMUSCULAR | Status: DC | PRN
  Administered 2020-10-15: 4 mg via INTRAVENOUS
  Filled 2020-10-14: qty 2

## 2020-10-14 MED ORDER — DIPHENHYDRAMINE HCL 50 MG/ML IJ SOLN: 12.5000 mg | INTRAMUSCULAR | Status: DC | PRN

## 2020-10-14 MED ORDER — LACTATED RINGERS AMNIOINFUSION: INTRAVENOUS | Status: DC

## 2020-10-14 MED ORDER — FENTANYL CITRATE (PF) 100 MCG/2ML IJ SOLN
50.0000 ug | INTRAMUSCULAR | Status: DC | PRN
  Administered 2020-10-14: 100 ug via INTRAVENOUS
  Filled 2020-10-14: qty 2

## 2020-10-14 MED ORDER — PHENYLEPHRINE 40 MCG/ML (10ML) SYRINGE FOR IV PUSH (FOR BLOOD PRESSURE SUPPORT): 80.0000 ug | PREFILLED_SYRINGE | INTRAVENOUS | Status: DC | PRN

## 2020-10-14 NOTE — Anesthesia Procedure Notes (Signed)
Epidural Patient location during procedure: OB Start time: 10/14/2020 8:55 PM End time: 10/14/2020 9:06 PM  Staffing Anesthesiologist: Atilano Median, DO Performed: anesthesiologist   Preanesthetic Checklist Completed: patient identified, IV checked, site marked, risks and benefits discussed, surgical consent, monitors and equipment checked, pre-op evaluation and timeout performed  Epidural Patient position: sitting Prep: ChloraPrep Patient monitoring: heart rate, continuous pulse ox and blood pressure Approach: midline Location: L3-L4 Injection technique: LOR saline  Needle:  Needle type: Tuohy  Needle gauge: 17 G Needle length: 9 cm Needle insertion depth: 7 cm Catheter type: closed end flexible Catheter size: 20 Guage Catheter at skin depth: 12 cm Test dose: negative and 1.5% lidocaine  Assessment Events: blood not aspirated, injection not painful, no injection resistance and no paresthesia  Additional Notes  Patient identified. Risks/Benefits/Options discussed with patient including but not limited to bleeding, infection, nerve damage, paralysis, failed block, incomplete pain control, headache, blood pressure changes, nausea, vomiting, reactions to medications, itching and postpartum back pain. Confirmed with bedside nurse the patient's most recent platelet count. Confirmed with patient that they are not currently taking any anticoagulation, have any bleeding history or any family history of bleeding disorders. Patient expressed understanding and wished to proceed. All questions were answered. Sterile technique was used throughout the entire procedure. Please see nursing notes for vital signs. Test dose was given through epidural catheter and negative prior to continuing to dose epidural or start infusion. Warning signs of high block given to the patient including shortness of breath, tingling/numbness in hands, complete motor block, or any concerning symptoms with  instructions to call for help. Patient was given instructions on fall risk and not to get out of bed. All questions and concerns addressed with instructions to call with any issues or inadequate analgesia.    Reason for block:procedure for pain

## 2020-10-14 NOTE — Progress Notes (Signed)
Labor Progress Note Samantha Hood is a 25 y.o. [id number] at [redacted]w[redacted]d presented for IOL-gHTN. S: Doing well, feeling contractions.  O:  BP 140/76   Pulse 87   Temp 98.5 F (36.9 C) (Oral)   Resp 16   Ht 5\' 2"  (1.575 m)   Wt 85.4 kg   BMI 34.42 kg/m  EFM: 130 bpm baseline/mod variability/+ accels/no decels Toco: q1-4 min  CVE: Dilation: 3 Effacement (%): Thick Station: -3 Exam by:: Weisgerber MD   A&P: 25 y.o. 22 [redacted]w[redacted]d presented for IOL-gHTN. #IOL: FB out with this exam. Pitocin @4mL /hr, continue to titrate. AROM @2020  with clear fluid.  #Pain: desires epidural #FWB: cat 1 #GBS negative #gHTN: platelets 126, RN will send p/c urine. BP mild range. Asymptomatic. Continue to monitor. #Rh neg: did not receive rhogam during pregnancy, limited PNC. Rhogam eval postpartum.  [redacted]w[redacted]d, MD 8:26 PM

## 2020-10-14 NOTE — Progress Notes (Signed)
IOL scheduled on 6/12.  Pt denies H/A or visual disturbances.

## 2020-10-14 NOTE — Anesthesia Preprocedure Evaluation (Addendum)
Anesthesia Evaluation  Patient identified by MRN, date of birth, ID band Patient awake    Reviewed: Allergy & Precautions, NPO status , Patient's Chart, lab work & pertinent test results  Airway Mallampati: II  TM Distance: >3 FB Neck ROM: Full    Dental  (+) Teeth Intact   Pulmonary neg pulmonary ROS,    Pulmonary exam normal        Cardiovascular hypertension,  Rhythm:Regular Rate:Normal     Neuro/Psych negative neurological ROS  negative psych ROS   GI/Hepatic negative GI ROS, Neg liver ROS,   Endo/Other  negative endocrine ROS  Renal/GU negative Renal ROS  negative genitourinary   Musculoskeletal   Abdominal (+)  Abdomen: soft. Bowel sounds: normal.  Peds  Hematology  (+) anemia ,   Anesthesia Other Findings   Reproductive/Obstetrics                             Anesthesia Physical Anesthesia Plan  ASA: 2  Anesthesia Plan: Epidural   Post-op Pain Management:    Induction:   PONV Risk Score and Plan: 2 and Treatment may vary due to age or medical condition  Airway Management Planned: Natural Airway  Additional Equipment: None  Intra-op Plan:   Post-operative Plan:   Informed Consent: I have reviewed the patients History and Physical, chart, labs and discussed the procedure including the risks, benefits and alternatives for the proposed anesthesia with the patient or authorized representative who has indicated his/her understanding and acceptance.     Dental advisory given  Plan Discussed with:   Anesthesia Plan Comments: (Lab Results      Component                Value               Date                      WBC                      9.7                 10/14/2020                HGB                      12.2                10/14/2020                HCT                      37.3                10/14/2020                MCV                      85.0                 10/14/2020                PLT                      126 (L)             10/14/2020  Lab Results      Component                Value               Date                      NA                       135                 10/14/2020                K                        3.7                 10/14/2020                CO2                      20 (L)              10/14/2020                GLUCOSE                  103 (H)             10/14/2020                BUN                      6                   10/14/2020                CREATININE               0.55                10/14/2020                CALCIUM                  8.7 (L)             10/14/2020                GFRNONAA                 >60                 10/14/2020                GFRAA                    >60                 11/18/2019          )        Anesthesia Quick Evaluation

## 2020-10-14 NOTE — Progress Notes (Signed)
Labor Progress Note Samantha Hood is a 25 y.o. [id number] at [redacted]w[redacted]d presented for IOL-gHTN. S: Doing well without complaints.  O:  BP (!) 139/52   Pulse 100   Temp 98.5 F (36.9 C) (Oral)   Resp 18   Ht 5\' 2"  (1.575 m)   Wt 85.4 kg   SpO2 100%   BMI 34.42 kg/m  EFM: 130 bpm baseline/mod variability/+ accels/intermittent variable decels Toco: q3-4 min  CVE: Dilation: 8 Effacement (%): 90 Station: 0 Exam by:: Dr. [email or web address]   A&P: 25 y.o. 22 [redacted]w[redacted]d presented for IOL-gHTN. #IOL: FB out 2020. AROM @2020  with clear fluid. Given decels IUPC placed without difficulty, will start amnioinfusion and recheck patient in 1 hour. Dr. 2021 at bedside as well and in agreement with plan. #Pain: epidural #FWB: cat 2, variable decels resolved with position changes #GBS negative #gHTN: platelets 126, RN will send p/c urine. BP mild range. Asymptomatic. Continue to monitor. #Rh neg: did not receive rhogam during pregnancy, limited PNC. Rhogam eval postpartum.  , MD 11:27 PM

## 2020-10-14 NOTE — H&P (Addendum)
LABOR AND DELIVERY ADMISSION HISTORY AND PHYSICAL NOTE  Samantha Hood is a 25 y.o. female [phone] with IUP at 56w5dby early [place]oreapresenting for IOL d/t gHTN and TOLAC. Pt has had two prior VBACs. She was seen in clinic today and had two elevated Bps (156/94, 143/95). She only had a few recorded Bps during during this pregnancy, but all were previously within a normal range. She denies any headaches, RUQ pain, vision changes, edema. Reports having gHTN during her c-section in 2016, but has never had PreE.  She reports positive fetal movement. She denies leakage of fluid, vaginal bleeding, or contractions.   She plans on formula feeding. Her contraception plan is: BTL (consented 05/24/20).  Prenatal History/Complications: [place] [facility]  @[redacted]w[redacted]d , CWD, normal anatomy, breech presentation, anterior placenta, 23%ile, EFW [id number] Pregnancy complications:  - Hx VBACx2 - gHTN - Rh negative - Abnormal pap: LSIL, +HPV - Limited prenatal care  Past Medical History: Past Medical History:  Diagnosis Date   Anemia    Chlamydia vaginitis/cervicitis 01/13/2015   Medical history non-contributory    Patient denies medical problems     Past Surgical History: Past Surgical History:  Procedure Laterality Date   CESAREAN SECTION     ORIF MANDIBULAR FRACTURE N/A 04/15/2018   Procedure: OPEN REDUCTION INTERNAL FIXATION (ORIF) MANDIBULAR FRACTURE;  Surgeon: JDiona Browner DDS;  [place] [facility]  Service: Oral Surgery;  Laterality: N/A;    Obstetrical History: OB History     Gravida  6   Para  4   Term  4   Preterm  0   AB  1   Living  4      SAB  0   IAB  0   Ectopic  0   Multiple  0   Live Births  4           Social History: Social History   Socioeconomic History   Marital status: Single    Spouse name: Not on file   Number of children: Not on file   Years of education: Not on file   Highest education level: Not on file  Occupational History   Not on file  Tobacco  Use   Smoking status: Never   Smokeless tobacco: Never  Vaping Use   Vaping Use: Never used  Substance and Sexual Activity   Alcohol use: Not Currently    Comment: socially    Drug use: Not Currently    Types: Marijuana    Comment: "years since last use"   Sexual activity: Yes    Birth control/protection: None, Injection  Other Topics Concern   Not on file  Social History Narrative   ** Merged History Encounter **       Social Determinants of Health   Financial Resource Strain: Not on file  Food Insecurity: No Food Insecurity   Worried About RCharity fundraiserin the Last Year: Never true   RHollyin the Last Year: Never true  Transportation Needs: No Transportation Needs   Lack of Transportation (Medical): No   Lack of Transportation (Non-Medical): No  Physical Activity: Not on file  Stress: Not on file  Social Connections: Not on file    Family History: No family history on file.  Allergies: No Known Allergies  Medications Prior to Admission  Medication Sig Dispense Refill Last Dose   Blood Pressure Monitoring (BLOOD PRESSURE KIT) DEVI 1 Device by Does not apply route daily. (Patient not  taking: Reported on 10/14/2020) 1 each 0    Prenatal Vit-Fe Fumarate-FA (PREPLUS) 27-1 MG TABS Take 1 tablet by mouth daily. 30 tablet 6      Review of Systems  All systems reviewed and negative except as stated in HPI  Physical Exam unknown if currently breastfeeding. General appearance: alert, oriented, NAD Lungs: normal respiratory effort Heart: regular rate Abdomen: soft, non-tender; gravid Extremities: No calf swelling or tenderness Presentation: cephalic by cervical exam  Fetal monitoring: Baseline: 130 bpm, Variability: Good {> 6 bpm), Accelerations: Reactive, and Decelerations: Absent Uterine activity: None     Prenatal labs: ABO, Rh: B/Negative/-- (02/15 0913) Antibody: Negative (02/15 0913) Rubella: 1.92 (02/15 0913) RPR: Non Reactive (02/15  0913)  HBsAg: Negative (02/15 0913)  HIV:   (will need to get rapid HIV) GC/Chlamydia: negative GBS: Negative/-- (06/01 1208)  2-hr GTT: not done Genetic screening:  normal Anatomy US: normal  Prenatal Transfer Tool  Maternal Diabetes: No Genetic Screening: Normal Maternal Ultrasounds/Referrals: Normal Fetal Ultrasounds or other Referrals:  None Maternal Substance Abuse:  No Significant Maternal Medications:  None Significant Maternal Lab Results: Group B Strep negative and Rh negative  No results found for this or any previous visit (from the past 24 hour(s)).  Patient Active Problem List   Diagnosis Date Noted   Transient hypertension of pregnancy in third trimester 10/14/2020   Pap smear abnormality of cervix with LGSIL, +HPV 06/25/2020   Rh negative state in antepartum period 06/22/2020   Previous cesarean section complicating pregnancy, antepartum condition or complication 36/04/2448   Supervision of high risk pregnancy, antepartum 01/30/2019   History of VBAC 07/16/2016    Assessment: Samantha Hood is a 25 y.o. [id number] at 44w5dhere for IOL d/t gHTN, TOLAC  #IOL/TOLAC: Not currently in labor. Avoid Cytotec given TOLAC. Cervix is close to being favorable, so will place FB and start pitocin. Cap pitocin at 660mmin until FB comes out. #gHTN: Initial BP 140/91. PEC labs pending. Asymptomatic. Continue monitoring BPs #Rh negative: did not receive rhogam at 28wks. Will give postpartum if indicated #Abnormal pap: LSIL, +HPV on 06/21/20, repeat pap in 1 year  #Fetal Wellbeing:  Category I #Pain Control: per pt request, planning epidural #GBS/ID: negative #COVID: swab pending #MOF: breast #MOC: interval BTL #Circ: n/a #Anticipated MOD: NSVD   WiAudree BaneMD, PGY-1 Family Medicine Resident, OB Faculty Teaching Service  10/14/2020, 5:14 PM   OB fellow attestation:  I have seen and examined this patient; I agree with above documentation in the resident's note.    Samantha GILLHAMs a [age].o. [id number] for IOL due to new gHTN dx in the office today; asymptomatic; labs pending.  PE: BP 140/76   Pulse 87   Temp 98.5 F (36.9 C) (Oral)   Resp 16   Ht 5' 2"  (1.575 m)   Wt 85.4 kg   BMI 34.42 kg/m  Gen: calm comfortable, NAD Resp: normal effort, no distress Abd: gravid  ROS, labs, PMH reviewed  Plan: -Admit to Labor and Delivery -Plan cervical foley and Pitocin for induction methods, followed by AROM as labor progresses -Pt still requesting VBAC (consent under media 06/21/20) -Awaiting pre-e labs to result -Considering BTL, but papers were from 05/25/19 and have expired; would need to plan for interval -Anticipate VBAC delivery  KiMyrtis SerNM 10/14/2020, 6:47 PM

## 2020-10-14 NOTE — Progress Notes (Signed)
Reported to bedside, FHR 90s, patient on hands/knees. VSS. Pitocin stopped. Instructed RN to administer LR bolus and terb given tachysystole. Patient rechecked on hands/knees 7/90/0. Dr. Vergie Living called and reported to bedside. FSE placed and cervical exam 8/90/0 and FHR 110s. Suspect due to rapid cervical dilation.  Alric Seton, MD OB Fellow, Faculty [facility], Center for [facility] 10/14/2020 10:10 PM

## 2020-10-14 NOTE — Progress Notes (Signed)
   PRENATAL VISIT NOTE  Subjective:  Samantha Hood is a 25 y.o. [id number] at [redacted]w[redacted]d being seen today for ongoing prenatal care.  She is currently monitored for the following issues for this high-risk pregnancy and has History of VBAC; Supervision of high risk pregnancy, antepartum; Previous cesarean section complicating pregnancy, antepartum condition or complication; Rh negative state in antepartum period; Pap smear abnormality of cervix with LGSIL, +HPV; and Transient hypertension of pregnancy in third trimester on their problem list.  Patient reports  no s/s of pre-eclampsia or labor .  Contractions: Irregular. Vag. Bleeding: None.  Movement: Present. Denies leaking of fluid.   The following portions of the patient's history were reviewed and updated as appropriate: allergies, current medications, past family history, past medical history, past social history, past surgical history and problem list.   Objective:   Vitals:   10/14/20 0852 10/14/20 0913  BP: (!) 156/94 (!) 143/95  Pulse: 85   Weight: 188 lb 12.8 oz (85.6 kg)     Fetal Status: Fetal Heart Rate (bpm): RNST   Movement: Present     General:  Alert, oriented and cooperative. Patient is in no acute distress.  Skin: Skin is warm and dry. No rash noted.   Cardiovascular: Normal heart rate noted  Respiratory: Normal respiratory effort, no problems with respiration noted  Abdomen: Soft, gravid, appropriate for gestational age.  Pain/Pressure: Present     Pelvic: Cervical exam deferred        Extremities: Normal range of motion.     Mental Status: Normal mood and affect. Normal behavior. Normal judgment and thought content.  Normal s1 and s2, no mrgs Ctab No ruq pain 1+ brachial b/l  Assessment and Plan:  Pregnancy: [id number] at [redacted]w[redacted]d 1. Supervision of high risk pregnancy, antepartum Desires BTL. Papers UTD  2. Previous cesarean section complicating pregnancy, antepartum condition or complication VBAC x 2. Consent already  signed. Pt desires another vbac  3. Post term pregnancy, antepartum condition or complication Bpp 10/10, cephalic, afi 9.6  4. Transient hypertension of pregnancy in third trimester Recommend going for IOL today. Pt okay with this. L&D aware  Term labor symptoms and general obstetric precautions including but not limited to vaginal bleeding, contractions, leaking of fluid and fetal movement were reviewed in detail with the patient. Please refer to After Visit Summary for other counseling recommendations.   No follow-ups on file.  Future Appointments  Date Time Provider Department Center  10/14/2020  9:40 AM [place] Bing, MD [facility]  10/14/2020 10:15 AM [facility]-SCREENING [facility] None  10/14/2020 10:20 AM [facility] US1 [facility]  10/16/2020  8:25 AM [facility]-LD SCHED ROOM [facility] None    [place] Bing, MD

## 2020-10-15 ENCOUNTER — Encounter (HOSPITAL_COMMUNITY): Payer: Self-pay | Admitting: Obstetrics and Gynecology

## 2020-10-15 DIAGNOSIS — O34211 Maternal care for low transverse scar from previous cesarean delivery: Secondary | ICD-10-CM

## 2020-10-15 DIAGNOSIS — Z3A40 40 weeks gestation of pregnancy: Secondary | ICD-10-CM

## 2020-10-15 DIAGNOSIS — O134 Gestational [pregnancy-induced] hypertension without significant proteinuria, complicating childbirth: Secondary | ICD-10-CM

## 2020-10-15 LAB — RPR: RPR Ser Ql: NONREACTIVE

## 2020-10-15 LAB — COMPREHENSIVE METABOLIC PANEL
ALT: 12 U/L (ref 0–44)
AST: 21 U/L (ref 15–41)
Albumin: 2.5 g/dL — ABNORMAL LOW (ref 3.5–5.0)
Alkaline Phosphatase: 131 U/L — ABNORMAL HIGH (ref 38–126)
Anion gap: 10 (ref 5–15)
BUN: 5 mg/dL — ABNORMAL LOW (ref 6–20)
CO2: 22 mmol/L (ref 22–32)
Calcium: 8.6 mg/dL — ABNORMAL LOW (ref 8.9–10.3)
Chloride: 105 mmol/L (ref 98–111)
Creatinine, Ser: 0.76 mg/dL (ref 0.44–1.00)
GFR, Estimated: >60 mL/min (ref >60)
Glucose, Bld: 119 mg/dL — ABNORMAL HIGH (ref 70–99)
Potassium: 3.4 mmol/L — ABNORMAL LOW (ref 3.5–5.1)
Sodium: 137 mmol/L (ref 135–145)
Total Bilirubin: 0.5 mg/dL (ref 0.3–1.2)
Total Protein: 5.7 g/dL — ABNORMAL LOW (ref 6.5–8.1)

## 2020-10-15 LAB — RAPID HIV SCREEN (HIV 1/2 AB+AG)
HIV 1/2 Antibodies: NONREACTIVE
HIV-1 P24 Antigen - HIV24: NONREACTIVE

## 2020-10-15 LAB — CBC
HCT: 36.9 % (ref 36.0–46.0)
HCT: 35.7 % — ABNORMAL LOW (ref 36.0–46.0)
Hemoglobin: 11.7 g/dL — ABNORMAL LOW (ref 12.0–15.0)
Hemoglobin: 11.6 g/dL — ABNORMAL LOW (ref 12.0–15.0)
MCH: 27.5 pg (ref 26.0–34.0)
MCH: 27.8 pg (ref 26.0–34.0)
MCHC: 31.7 g/dL (ref 30.0–36.0)
MCHC: 32.5 g/dL (ref 30.0–36.0)
MCV: 86.8 fL (ref 80.0–100.0)
MCV: 85.6 fL (ref 80.0–100.0)
Platelets: 123 10*3/uL — ABNORMAL LOW (ref 150–400)
Platelets: 133 10*3/uL — ABNORMAL LOW (ref 150–400)
RBC: 4.25 MIL/uL (ref 3.87–5.11)
RBC: 4.17 MIL/uL (ref 3.87–5.11)
RDW: 16.2 % — ABNORMAL HIGH (ref 11.5–15.5)
RDW: 16.3 % — ABNORMAL HIGH (ref 11.5–15.5)
WBC: 12.5 10*3/uL — ABNORMAL HIGH (ref 4.0–10.5)
WBC: 17.2 10*3/uL — ABNORMAL HIGH (ref 4.0–10.5)
nRBC: 0 % (ref 0.0–0.2)
nRBC: 0 % (ref 0.0–0.2)

## 2020-10-15 LAB — PROTEIN / CREATININE RATIO, URINE
Creatinine, Urine: 197.04 mg/dL
Protein Creatinine Ratio: 0.13 mg/mg{Cre} (ref 0.00–0.15)
Total Protein, Urine: 25 mg/dL

## 2020-10-15 MED ORDER — DIBUCAINE (PERIANAL) 1 % EX OINT: 1.0000 "application " | TOPICAL_OINTMENT | CUTANEOUS | Status: DC | PRN

## 2020-10-15 MED ORDER — BENZOCAINE-MENTHOL 20-0.5 % EX AERO: 1.0000 "application " | INHALATION_SPRAY | CUTANEOUS | Status: DC | PRN

## 2020-10-15 MED ORDER — SENNOSIDES-DOCUSATE SODIUM 8.6-50 MG PO TABS
2.0000 | ORAL_TABLET | ORAL | Status: DC
  Administered 2020-10-16: 2 via ORAL
  Filled 2020-10-15 (×2): qty 2

## 2020-10-15 MED ORDER — ONDANSETRON HCL 4 MG/2ML IJ SOLN: 4.0000 mg | INTRAMUSCULAR | Status: DC | PRN

## 2020-10-15 MED ORDER — SIMETHICONE 80 MG PO CHEW: 80.0000 mg | CHEWABLE_TABLET | ORAL | Status: DC | PRN

## 2020-10-15 MED ORDER — DIPHENHYDRAMINE HCL 25 MG PO CAPS: 25.0000 mg | ORAL_CAPSULE | Freq: Four times a day (QID) | ORAL | Status: DC | PRN

## 2020-10-15 MED ORDER — TETANUS-DIPHTH-ACELL PERTUSSIS 5-2.5-18.5 LF-MCG/0.5 IM SUSY: 0.5000 mL | PREFILLED_SYRINGE | Freq: Once | INTRAMUSCULAR | Status: DC

## 2020-10-15 MED ORDER — IBUPROFEN 600 MG PO TABS
600.0000 mg | ORAL_TABLET | Freq: Four times a day (QID) | ORAL | Status: DC
  Administered 2020-10-15 – 2020-10-16 (×4): 600 mg via ORAL
  Filled 2020-10-15 (×6): qty 1

## 2020-10-15 MED ORDER — PRENATAL MULTIVITAMIN CH
1.0000 | ORAL_TABLET | Freq: Every day | ORAL | Status: DC
  Filled 2020-10-15 (×2): qty 1

## 2020-10-15 MED ORDER — COCONUT OIL OIL: 1.0000 "application " | TOPICAL_OIL | Status: DC | PRN

## 2020-10-15 MED ORDER — ONDANSETRON HCL 4 MG PO TABS: 4.0000 mg | ORAL_TABLET | ORAL | Status: DC | PRN

## 2020-10-15 MED ORDER — RHO D IMMUNE GLOBULIN 1500 UNIT/2ML IJ SOSY
300.0000 ug | PREFILLED_SYRINGE | Freq: Once | INTRAMUSCULAR | Status: AC
  Administered 2020-10-15: 300 ug via INTRAVENOUS
  Filled 2020-10-15: qty 2

## 2020-10-15 MED ORDER — MEASLES, MUMPS & RUBELLA VAC IJ SOLR: 0.5000 mL | Freq: Once | INTRAMUSCULAR | Status: DC

## 2020-10-15 MED ORDER — WITCH HAZEL-GLYCERIN EX PADS: 1.0000 "application " | MEDICATED_PAD | CUTANEOUS | Status: DC | PRN

## 2020-10-15 MED ORDER — ACETAMINOPHEN 325 MG PO TABS: 650.0000 mg | ORAL_TABLET | ORAL | Status: DC | PRN

## 2020-10-15 NOTE — Progress Notes (Signed)
Called to bedside by RN for prolonged decel to 90s. Maternal VSS. Patient repositioned to high fowlers with resolution. LR bolus administered. FSE in place. Amnioinfusion running. FHR 130s. Plan to recheck at 0330.  Alric Seton, MD OB Fellow, Faculty [facility], Center for [facility] 10/15/2020 2:06 AM

## 2020-10-15 NOTE — Lactation Note (Addendum)
This note was copied from a baby's chart. Lactation Consultation Note  Patient Name: Samantha Hood [id number] Date: 10/15/2020 Reason for consult: Initial assessment Age:25 hours  Mother is a P5, Mother reports that infant was breastfeeding earlier today, but she didn't think she had any milk. Mother reports that she attempt to breastfeed her other children but she reports that she felt them bit. Mother was given [facility]  Reviewed hand expression with mother. Observed large drops of colostrum. Mother was sat up with a DEBP by staff nurse. Mother reports that she has not seen any milk. Mother was assist with hand expression and no drops of colostrum observed.  The harmony hand pump was put together with a #24 and mother taught how to use. Mother was able to collect several large drops  in a spoon.  Infant to Lt breast, but too sleepy to latch. Infant had just been fed 10 ml of formula with a bottle.   Plan of Care : Breastfeed infant with feeding cues Supplement infant with ebm/formula, according to supplemental guidelines. Pump using a DEBP after each feeding for 15-20 mins.   Mother to continue to cue base feed infant and feed at least 8-12 times or more in 24 hours and advised to allow for cluster feeding infant as needed.  Mother to continue to due STS. Mother is aware of available LC services at [facility], BFSG'S, OP Dept, and phone # for questions or concerns about breastfeeding.  Mother receptive to all teaching and plan of care.    Maternal Data Has patient been taught Hand Expression?: Yes Does the patient have breastfeeding experience prior to this delivery?: Yes How long did the patient breastfeed?: only short attempt while in the hospital, mother reports that infants would bit her  Feeding Mother's Current Feeding Choice: Breast Milk Nipple Type: Slow - flow  LATCH Score                    Lactation Tools Discussed/Used    Interventions Interventions: Hand  express;Expressed milk;Hand pump  Discharge Pump: DEBP;Manual  Consult Status Consult Status: Follow-up Date: 10/15/20 Follow-up type: In-patient    Stevan Born [facility] 10/15/2020, 4:05 PM

## 2020-10-15 NOTE — Discharge Summary (Signed)
Postpartum Discharge Summary  Date of Service updated 10/16/20     Patient Name: Samantha Hood DOB: Dec 28, 1995 MRN: [id number]  Date of admission: 10/14/2020 Delivery date:10/15/2020  Delivering provider: Arrie Senate  Date of discharge: 10/16/2020  Admitting diagnosis: Post-dates pregnancy [O48.0] Intrauterine pregnancy: [redacted]w[redacted]d    Secondary diagnosis:  Active Problems:   History of VBAC   Supervision of high risk pregnancy, antepartum   Rh negative state in antepartum period   Pap smear abnormality of cervix with LGSIL, +HPV   Gestational hypertension   Short interval between pregnancies affecting pregnancy, antepartum   Insufficient prenatal care  Additional problems: none    Discharge diagnosis: Term Pregnancy Delivered and VBAC                                              Post partum procedures: postpartum nexplanon pending insurance Augmentation: AROM, Pitocin, and IP Foley Complications: None  Hospital course: Induction of Labor With Vaginal Delivery   25y.o. yo [id number] admitted to the hospital 10/14/2020 for induction of labor.  Indication for induction: Gestational hypertension.  IOL initially started with FB and pitocin. FB dislodged and patient found to be 6cm. Patient with recurrent variable and prolonged decels so FSE was placed. Ultimately pitocin was stopped, IUPC placed and amnioinfusion administered. Pitocin was restarted and patient progressed to complete with uncomplicated vaginal delivery. Membrane Rupture Time/Date: 8:22 PM ,10/14/2020   Delivery Method:VBAC, Spontaneous  Episiotomy: None  Lacerations:  None  Details of delivery can be found in separate delivery note.  Patient had a routine postpartum course. Patient is discharged home 10/16/20.  Newborn Data: Birth date:10/15/2020  Birth time:3:34 AM  Gender:Female  Living status:Living  Apgars:8 ,9  Weight:2801 g   Magnesium Sulfate received: No BMZ received:  No Rhophylac:Yes MMR:N/A T-DaP:Given postpartum Flu: No Transfusion:No  Physical exam  Vitals:   10/15/20 1139 10/15/20 1628 10/15/20 1920 10/16/20 0544  BP: 129/64 136/86 132/87 131/84  Pulse: 90 86 84 82  Resp: 16 16 18 18   Temp: 98 F (36.7 C) 98 F (36.7 C) 98.6 F (37 C) 98.2 F (36.8 C)  TempSrc: Oral Oral Oral Oral  SpO2: 99% 100% 100% 100%  Weight:      Height:       General: alert, cooperative, and no distress Lochia: appropriate Uterine Fundus: firm Incision: N/A DVT Evaluation: No evidence of DVT seen on physical exam. Labs: Lab Results  Component Value Date   WBC 17.2 (H) 10/15/2020   HGB 11.6 (L) 10/15/2020   HCT 35.7 (L) 10/15/2020   MCV 85.6 10/15/2020   PLT 133 (L) 10/15/2020   CMP Latest Ref Rng & Units 10/15/2020  Glucose 70 - 99 mg/dL 119(H)  BUN 6 - 20 mg/dL 5(L)  Creatinine 0.44 - 1.00 mg/dL 0.76  Sodium 135 - 145 mmol/L 137  Potassium 3.5 - 5.1 mmol/L 3.4(L)  Chloride 98 - 111 mmol/L 105  CO2 22 - 32 mmol/L 22  Calcium 8.9 - 10.3 mg/dL 8.6(L)  Total Protein 6.5 - 8.1 g/dL 5.7(L)  Total Bilirubin 0.3 - 1.2 mg/dL 0.5  Alkaline Phos 38 - 126 U/L 131(H)  AST 15 - 41 U/L 21  ALT 0 - 44 U/L 12   Edinburgh Score: Edinburgh Postnatal Depression Scale Screening Tool 06/22/2019  I have been able to laugh  and see the funny side of things. 0  I have looked forward with enjoyment to things. 0  I have blamed myself unnecessarily when things went wrong. 2  I have been anxious or worried for no good reason. 0  I have felt scared or panicky for no good reason. 0  Things have been getting on top of me. 0  I have been so unhappy that I have had difficulty sleeping. 0  I have felt sad or miserable. 0  I have been so unhappy that I have been crying. 0  The thought of harming myself has occurred to me. 0  Edinburgh Postnatal Depression Scale Total 2     After visit meds:  Allergies as of 10/16/2020   No Known Allergies      Medication List      STOP taking these medications    Blood Pressure Kit Devi       TAKE these medications    acetaminophen 500 MG tablet Commonly known as: TYLENOL Take 2 tablets (1,000 mg total) by mouth every 6 (six) hours as needed (for pain scale < 4).   amLODipine 5 MG tablet Commonly known as: NORVASC Take 1 tablet (5 mg total) by mouth daily.   coconut oil Oil Apply 1 application topically as needed.   ibuprofen 600 MG tablet Commonly known as: ADVIL Take 1 tablet (600 mg total) by mouth every 6 (six) hours as needed.   PrePLUS 27-1 MG Tabs Take 1 tablet by mouth daily.         Discharge home in stable condition Infant Feeding: Bottle Infant Disposition:home with mother Discharge instruction: per After Visit Summary and Postpartum booklet. Activity: Advance as tolerated. Pelvic rest for 6 weeks.  Diet: routine diet Future Appointments:No future appointments. Follow up Visit: Message sent to [facility] 10/15/20 by Sylvester Harder.   Please schedule this patient for a In person postpartum visit in 6 weeks with the following provider: Any provider. Additional Postpartum F/U:BP check 1 week and MD visit to discuss BTL/sign papers (same appt) High risk pregnancy complicated by: HTN Delivery mode:  VBAC, Spontaneous  Anticipated Birth Control:  Plans Interval BTL   9/93/7169 Arrie Senate, MD

## 2020-10-15 NOTE — Progress Notes (Signed)
Patient requested Rn called to get an order for abdominal binder. Dr. Revonda Standard gave verbal order.

## 2020-10-15 NOTE — Anesthesia Postprocedure Evaluation (Signed)
Anesthesia Post Note  Patient: Samantha Hood  Procedure(s) Performed: AN AD HOC LABOR EPIDURAL     Patient location during evaluation: Mother Baby Anesthesia Type: Epidural Level of consciousness: awake and alert, oriented and patient cooperative Pain management: pain level controlled Vital Signs Assessment: post-procedure vital signs reviewed and stable Respiratory status: spontaneous breathing Cardiovascular status: stable Postop Assessment: no headache, epidural receding, patient able to bend at knees and no signs of nausea or vomiting Anesthetic complications: no Comments: Pt. States she is walking.  Pain score 0.    No notable events documented.  Last Vitals:  Vitals:   10/15/20 0717 10/15/20 1139  BP: 134/77 129/64  Pulse: 75 90  Resp: 16 16  Temp: 36.9 C 36.7 C  SpO2: 99% 99%    Last Pain:  Vitals:   10/15/20 1139  TempSrc: Oral  PainSc: 0-No pain   Pain Goal: Patients Stated Pain Goal: 0 (10/14/20 1930)                 Merrilyn Puma

## 2020-10-15 NOTE — Progress Notes (Signed)
Reported to bedside to recheck patient. Cervical exam unchanged from prior. VSS. Will restart pitocin and recheck cervix in 1 hour per Dr. Vergie Living. Patient amendable to plan.  Alric Seton, MD OB Fellow, Faculty [facility], Center for [facility] 10/15/2020 12:37 AM

## 2020-10-16 ENCOUNTER — Inpatient Hospital Stay (HOSPITAL_COMMUNITY): Admission: AD | Admit: 2020-10-16 | Payer: Self-pay | Source: Home / Self Care | Admitting: Obstetrics and Gynecology

## 2020-10-16 ENCOUNTER — Inpatient Hospital Stay (HOSPITAL_COMMUNITY): Payer: Self-pay

## 2020-10-16 LAB — RH IG WORKUP (INCLUDES ABO/RH)
Fetal Screen: NEGATIVE
Gestational Age(Wks): 40.6
Unit division: 0

## 2020-10-16 MED ORDER — IBUPROFEN 600 MG PO TABS: 600.0000 mg | ORAL_TABLET | Freq: Four times a day (QID) | ORAL | 0 refills | Status: DC | PRN

## 2020-10-16 MED ORDER — ACETAMINOPHEN 500 MG PO TABS: 1000.0000 mg | ORAL_TABLET | Freq: Four times a day (QID) | ORAL | Status: DC | PRN

## 2020-10-16 MED ORDER — AMLODIPINE BESYLATE 5 MG PO TABS: 5.0000 mg | ORAL_TABLET | Freq: Every day | ORAL | 3 refills | Status: DC

## 2020-10-16 MED ORDER — COCONUT OIL OIL: 1.0000 "application " | TOPICAL_OIL | 0 refills | Status: DC | PRN

## 2020-10-16 MED ORDER — AMLODIPINE BESYLATE 5 MG PO TABS
5.0000 mg | ORAL_TABLET | Freq: Every day | ORAL | Status: DC
  Administered 2020-10-16: 5 mg via ORAL
  Filled 2020-10-16: qty 1

## 2020-10-16 NOTE — Clinical Social Work Maternal (Addendum)
CLINICAL SOCIAL WORK MATERNAL/CHILD NOTE  Patient Details  Name: JULIEANA ESHLEMAN MRN: [id number] Date of Birth: 06/14/1995  Date:  10/16/2020  Clinical Social Worker Initiating Note:  Darcus Austin, MSW, LCSWA Date/Time: Initiated:  10/16/20/1236     Child's Name:  Dorian Pod   Biological Parents:  Mother, Father Lytle Michaels, 04/13/95, [phone])   Need for Interpreter:  None   Reason for Referral:  Late or No Prenatal Care   (Limited [facility])   Address:  [address] [place] [address]    Phone number:  [phone] (home)     Additional phone number:   Household Members/Support Persons (HM/SP):   Household Member/Support Person 1, Household Member/Support Person 2, Household Member/Support Person 3, Household Member/Support Person 4   HM/SP Name Relationship DOB or Age  HM/SP -1 Lacey Jensen 12/12/12  HM/SP -2 Aubrie Lucien Daughter 05/16/15  HM/SP -3 Ke'mari Bobbye [place] Son 11/28/16  HM/SP -4 Gerald Leitz Daughter 06/21/19  HM/SP -5        HM/SP -6        HM/SP -7        HM/SP -8          Natural Supports (not living in the home):  Immediate Family   Professional Supports: None   Employment: Unemployed   Type of Work:     Education:  Other (comment) (11th grade)   Homebound arranged:    Museum/gallery curator Resources:  Medicaid   Other Resources:  [employer], Physicist, medical     Cultural/Religious Considerations Which May Impact Care:    Strengths:  Ability to meet basic needs  , Pediatrician chosen, Home prepared for child     Psychotropic Medications:         Pediatrician:    Solicitor area  Pediatrician List:   [facility] ([address])  [place]      Pediatrician Fax Number:    Risk Factors/Current Problems:  Other (Comment) (Limited [facility])   Cognitive State:  Alert  , Linear Thinking  , Insightful  , Goal Oriented  , Able to  Concentrate     Mood/Affect:  Interested  , Comfortable  , Calm  , Happy  , Relaxed  , Bright     CSW Assessment: CSW met with MOB to complete consult for limited prenatal care. CSW observed MOB resting in bed bonding with infant. CSW explained role and reason for consult. MOB was pleasant, polite, and engaged with CSW. MOB reported, providing appropriate medicaid plan and transportation issues was the reason for limited prenatal care.                           CSW informed MOB of Drug Screen Policy and MOB was understanding of protocol. MOB denied any illicit substances usage or CPS involvement.   CSW provided education regarding the baby blues period vs. perinatal mood disorders, discussed treatment and gave resources for mental health follow up if concerns arise. CSW recommends self- evaluation during the postpartum time period using the New Mom Checklist from Postpartum Progress and encouraged MOB to contact a medical professional if symptoms are noted at any time.   When CSW asked MOB of her emotions since delivery. MOB reported, she feels, "good". MOB reported, her siblings are her supports. MOB denied SI, HI,  and DV when CSW assessed for safety.   MOB reported, she receive WIC, and food stamps. MOB reported, infant's pediatrician will be at [place] and there are no barriers to follow up infant's care. MOB reported, she has all essentials needed to care for infant. MOB reported, infant has a car seat and bassinet. MOB denied any additional barriers.     CSW provided education on sudden infant death syndrome (SIDS).  CSW inform Cannon Kettle, RN of CDS needed.    Per Cannon Kettle, RN drug screen policy is not needed. After second review, MOB "does" meet guidelines for prenatal care.   CSW Plan/Description:  No Further Intervention Required/No Barriers to Discharge, Sudden Infant Death Syndrome (SIDS) Education, Perinatal Mood and Anxiety Disorder (PMADs) Education,  [place], CSW Will Continue to Monitor Umbilical Cord Tissue Drug Screen Results and Make Report if Warranted    Collene Gobble 10/16/2020, 1:10 PM  Darcus Austin, MSW, LCSW-A Clinical Social Worker [phone]

## 2020-10-16 NOTE — Progress Notes (Signed)
RN assisting pt in babyscripts htn monitoring. RN entered order and patient never received the code, but did find previously sent code in pt's older emails from the ob office. This older code was entered and the patient could view the app. On the epic side, under the episode, the OB Home device link did not show up. This RN got help from charge RN and other RN's who have also encountered this challenge. This RN was told to instruct the patient to call her OB office Monday morning to troubleshoot this technical issue.This RN reviewed how to take bp with cuff. MD [place] notified of this baby scripts disconnect (MD in c-section); RN notified MD that she was making a note.

## 2020-11-14 ENCOUNTER — Ambulatory Visit: Payer: Medicaid Other | Admitting: Obstetrics and Gynecology

## 2021-01-16 ENCOUNTER — Ambulatory Visit: Payer: Self-pay | Admitting: Obstetrics & Gynecology

## 2021-05-07 NOTE — L&D Delivery Note (Signed)
Delivery Note Samantha Hood is a 26 y.o. female [phone] with IUP at [redacted]w[redacted]d admitted for IOL for gestational hypertension.  She progressed with augmentation to complete and pushed to deliver.    At 2:39 AM a healthy female was delivered via Vaginal, Spontaneous (Presentation: Left Occiput Anterior).  APGAR: 8, 9; weight  .   Placenta status: Spontaneous, Intact.  Cord: 3 vessels with the following complications: None.  Cord pH: not collected  Cord clamping delayed by 1 minute then clamped by me and cut by FOB.  Placenta intact and spontaneous, bleeding minimal.  No perineal laceration observed. Mom and baby stable prior to transfer to postpartum. She plans on breastfeeding. She requests postpartum BTL for birth control.  Anesthesia: None Episiotomy: None Lacerations: None Suture Repair:  n/a Est. Blood Loss (mL): 75  Mom to postpartum.  Baby to Couplet care / Skin to Skin.  Samariyah Cowles J Nayellie Sanseverino 12/16/2021, 3:34 AM

## 2021-05-10 ENCOUNTER — Ambulatory Visit
Admission: EM | Admit: 2021-05-10 | Discharge: 2021-05-10 | Disposition: A | Discharge status: Home / Self Care | Payer: Medicaid Other | Attending: Internal Medicine | Admitting: Internal Medicine

## 2021-05-10 ENCOUNTER — Other Ambulatory Visit: Payer: Self-pay

## 2021-05-10 DIAGNOSIS — J029 Acute pharyngitis, unspecified: Secondary | ICD-10-CM | POA: Diagnosis not present

## 2021-05-10 DIAGNOSIS — K047 Periapical abscess without sinus: Secondary | ICD-10-CM | POA: Diagnosis not present

## 2021-05-10 LAB — POCT RAPID STREP A (OFFICE): Rapid Strep A Screen: NEGATIVE

## 2021-05-10 MED ORDER — AMOXICILLIN-POT CLAVULANATE 875-125 MG PO TABS: 1.0000 | ORAL_TABLET | Freq: Two times a day (BID) | ORAL | 0 refills | Status: DC

## 2021-05-10 NOTE — Discharge Instructions (Signed)
You have been prescribed an antibiotic to help treat dental abscess.  Rapid strep was negative.  Throat culture, COVID-19, flu test is pending.  We will call if it is positive.

## 2021-05-10 NOTE — ED Provider Notes (Signed)
[facility]    CSN: [id number] Arrival date & time: 05/10/21  1311      History   Chief Complaint Chief Complaint  Patient presents with   mouth situation    HPI Samantha Hood is a 26 y.o. female.   Patient presents with sore throat and dental abscess that has been present for approximately 1 week.  Dental abscess is present to right upper gum.  Denies any difficulty swallowing or painful swallowing.  Denies any upper respiratory symptoms or fever.  Denies any known sick contacts.  She does have an appointment with a dentist in approximately 2 days for further evaluation and management of dental abscess.    Past Medical History:  Diagnosis Date   Anemia    Chlamydia vaginitis/cervicitis 01/13/2015   Medical history non-contributory    Patient denies medical problems     Patient Active Problem List   Diagnosis Date Noted   Gestational hypertension 10/14/2020   Short interval between pregnancies affecting pregnancy, antepartum 10/14/2020   Insufficient prenatal care 10/14/2020   Pap smear abnormality of cervix with LGSIL, +HPV 06/25/2020   Rh negative state in antepartum period 06/22/2020   Previous cesarean section complicating pregnancy, antepartum condition or complication 02/18/2019   Supervision of high risk pregnancy, antepartum 01/30/2019   History of VBAC 07/16/2016    Past Surgical History:  Procedure Laterality Date   CESAREAN SECTION     ORIF MANDIBULAR FRACTURE N/A 04/15/2018   Procedure: OPEN REDUCTION INTERNAL FIXATION (ORIF) MANDIBULAR FRACTURE;  Surgeon: Ocie Doyne, DDS;  Location: [facility] OR;  Service: Oral Surgery;  Laterality: N/A;    OB History     Gravida  6   Para  5   Term  5   Preterm  0   AB  1   Living  5      SAB  0   IAB  0   Ectopic  0   Multiple  0   Live Births  5            Home Medications    Prior to Admission medications   Medication Sig Start Date End Date Taking? Authorizing Provider   amoxicillin-clavulanate (AUGMENTIN) 875-125 MG tablet Take 1 tablet by mouth every 12 (twelve) hours. 05/10/21  Yes Omarr Hann, Acie Fredrickson, FNP  acetaminophen (TYLENOL) 500 MG tablet Take 2 tablets (1,000 mg total) by mouth every 6 (six) hours as needed (for pain scale < 4). 10/16/20   Alric Seton, MD  amLODipine (NORVASC) 5 MG tablet Take 1 tablet (5 mg total) by mouth daily. 10/16/20   Alric Seton, MD  coconut oil OIL Apply 1 application topically as needed. 10/16/20   Alric Seton, MD  ibuprofen (ADVIL) 600 MG tablet Take 1 tablet (600 mg total) by mouth every 6 (six) hours as needed. 10/16/20   Alric Seton, MD  Prenatal Vit-Fe Fumarate-FA (PREPLUS) 27-1 MG TABS Take 1 tablet by mouth daily. 06/21/20   Venora Maples, MD    Family History History reviewed. No pertinent family history.  Social History Social History   Tobacco Use   Smoking status: Never   Smokeless tobacco: Never  Vaping Use   Vaping Use: Never used  Substance Use Topics   Alcohol use: Not Currently    Comment: socially    Drug use: Not Currently    Types: Marijuana    Comment: "years since last use"     Allergies   Patient has no  known allergies.   Review of Systems Review of Systems Per HPI  Physical Exam Triage Vital Signs ED Triage Vitals [05/10/21 1444]  Enc Vitals Group     BP 124/72     Pulse Rate 85     Resp 18     Temp 98.4 F (36.9 C)     Temp Source Oral     SpO2 100 %     Weight      Height      Head Circumference      Peak Flow      Pain Score 0     Pain Loc      Pain Edu?      Excl. in GC?    No data found.  Updated Vital Signs BP 124/72 (BP Location: Right Arm)    Pulse 85    Temp 98.4 F (36.9 C) (Oral)    Resp 18    SpO2 100%    Breastfeeding No   Visual Acuity Right Eye Distance:   Left Eye Distance:   Bilateral Distance:    Right Eye Near:   Left Eye Near:    Bilateral Near:     Physical Exam Constitutional:      General: She is not in  acute distress.    Appearance: Normal appearance. She is not toxic-appearing or diaphoretic.  HENT:     Head: Normocephalic and atraumatic.     Mouth/Throat:     Dentition: Abnormal dentition. Does not have dentures. Dental tenderness, gingival swelling and dental abscesses present. No dental caries or gum lesions.     Pharynx: Posterior oropharyngeal erythema present. No pharyngeal swelling, oropharyngeal exudate or uvula swelling.     Tonsils: No tonsillar exudate or tonsillar abscesses.     Comments: Patient has dental abscess present to right upper gums.  No drainage noted. Eyes:     Extraocular Movements: Extraocular movements intact.     Conjunctiva/sclera: Conjunctivae normal.  Pulmonary:     Effort: Pulmonary effort is normal.  Neurological:     General: No focal deficit present.     Mental Status: She is alert and oriented to person, place, and time. Mental status is at baseline.  Psychiatric:        Mood and Affect: Mood normal.        Behavior: Behavior normal.        Thought Content: Thought content normal.        Judgment: Judgment normal.     UC Treatments / Results  Labs (all labs ordered are listed, but only abnormal results are displayed) Labs Reviewed  CULTURE, GROUP A STREP ([facility])  COVID-19, FLU A+B NAA  POCT RAPID STREP A (OFFICE)    EKG   Radiology No results found.  Procedures Procedures (including critical care time)  Medications Ordered in UC Medications - No data to display  Initial Impression / Assessment and Plan / UC Course  I have reviewed the triage vital signs and the nursing notes.  Pertinent labs & imaging results that were available during my care of the patient were reviewed by me and considered in my medical decision making (see chart for details).     Will treat dental abscess with Augmentin antibiotic.  Rapid strep was completed due to sore throat.  It was negative.  Throat culture, COVID-19, flu swab pending to rule out  etiologies of sore throat.  Suspect that pain in mouth could be related to dental abscess.  Patient advised of the  importance of following up with her dentist at scheduled appointment in approximately 2 days for further evaluation and management.  Patient verbalized understanding and was agreeable with plan. Final Clinical Impressions(s) / UC Diagnoses   Final diagnoses:  Dental abscess  Sore throat     Discharge Instructions      You have been prescribed an antibiotic to help treat dental abscess.  Rapid strep was negative.  Throat culture, COVID-19, flu test is pending.  We will call if it is positive.    ED Prescriptions     Medication Sig Dispense Auth. Provider   amoxicillin-clavulanate (AUGMENTIN) 875-125 MG tablet Take 1 tablet by mouth every 12 (twelve) hours. 14 tablet [place], Acie Fredrickson, [place]      PDMP not reviewed this encounter.   Gustavus Bryant, [place] 05/10/21 1505

## 2021-05-10 NOTE — ED Triage Notes (Signed)
Pt c/o 2 small abscess on tongue, itchy top of mouth, bumps in back of throat. First noticed ~ 1 week ago.

## 2021-05-11 LAB — COVID-19, FLU A+B NAA
Influenza A, NAA: NOT DETECTED
Influenza B, NAA: NOT DETECTED
SARS-CoV-2, NAA: NOT DETECTED

## 2021-05-13 LAB — CULTURE, GROUP A STREP (THRC)

## 2021-05-23 ENCOUNTER — Encounter (HOSPITAL_COMMUNITY): Payer: Self-pay | Admitting: Emergency Medicine

## 2021-05-23 ENCOUNTER — Emergency Department (HOSPITAL_COMMUNITY)
Admission: EM | Admit: 2021-05-23 | Discharge: 2021-05-23 | Disposition: A | Discharge status: Home / Self Care | Payer: Medicaid Other | Attending: Emergency Medicine | Admitting: Emergency Medicine

## 2021-05-23 DIAGNOSIS — Z20822 Contact with and (suspected) exposure to covid-19: Secondary | ICD-10-CM | POA: Diagnosis not present

## 2021-05-23 DIAGNOSIS — Z79899 Other long term (current) drug therapy: Secondary | ICD-10-CM | POA: Insufficient documentation

## 2021-05-23 DIAGNOSIS — J029 Acute pharyngitis, unspecified: Secondary | ICD-10-CM | POA: Insufficient documentation

## 2021-05-23 DIAGNOSIS — H1013 Acute atopic conjunctivitis, bilateral: Secondary | ICD-10-CM | POA: Insufficient documentation

## 2021-05-23 LAB — RESP PANEL BY RT-PCR (FLU A&B, COVID) ARPGX2
Influenza A by PCR: NEGATIVE
Influenza B by PCR: NEGATIVE
SARS Coronavirus 2 by RT PCR: NEGATIVE

## 2021-05-23 LAB — PREGNANCY, URINE: Preg Test, Ur: POSITIVE — AB

## 2021-05-23 MED ORDER — LORATADINE 10 MG PO TABS
10.0000 mg | ORAL_TABLET | Freq: Once | ORAL | Status: AC
  Administered 2021-05-23: 10 mg via ORAL
  Filled 2021-05-23: qty 1

## 2021-05-23 MED ORDER — OLOPATADINE HCL 0.1 % OP SOLN
1.0000 [drp] | Freq: Two times a day (BID) | OPHTHALMIC | Status: DC
  Filled 2021-05-23: qty 5

## 2021-05-23 MED ORDER — KETOTIFEN FUMARATE 0.025 % OP SOLN: 1.0000 [drp] | Freq: Two times a day (BID) | OPHTHALMIC | Status: DC

## 2021-05-23 NOTE — Discharge Instructions (Addendum)
Return to ED with any new or worsening symptoms such as abdominal pain, vaginal bleeding, abdominal cramping Please follow-up with your OB/GYN in the next 3 to 5 days for management of your new pregnancy Please begin taking 10 mg of Claritin once a day for your allergies. Please follow-up with PCP and make appointment in the next 7 to 10 days for ongoing management of allergies.  I have referred you to a PCP.

## 2021-05-23 NOTE — ED Notes (Signed)
I provided reinforced discharge education based off of discharge instructions. Pt acknowledged and understood my education. Pt had no further questions/concerns for provider/myself.  °

## 2021-05-23 NOTE — ED Triage Notes (Signed)
Per pt, states eyes and throat are irritated-states eyes closed shut this am

## 2021-05-23 NOTE — ED Provider Notes (Signed)
[facility] DEPT Provider Note   CSN: [id number] Arrival date & time: 05/23/21  [id number]     History  Chief Complaint  Patient presents with   Eye Problem   Sore Throat    Samantha Hood is a 26 y.o. female with medical history of anemia.  Patient states that for the last 3 weeks she has had bilateral ear, eye and throat itching.  Patient stated that the itching began in her throat and then progressed to her ears and is now affecting both her eyes.  Patient states that she woke up this morning with clear discharge coming from her right eye.  Patient was seen for the same complaint 1 week ago urgent care where they prescribed her antibiotics for a suppose a gum abscess.  Patient is a poor historian and is unable to tell the provider what the prescription was for or if she has completed the antibiotic course.  Patient endorses itchy throat, itchy bilateral ears, itchy bilateral eyes.  Patient denies fevers, nausea, vomiting, diarrhea, sore throat, shortness of breath.  Eye Problem Associated symptoms: discharge, itching and redness   Associated symptoms: no nausea and no vomiting   Sore Throat Pertinent negatives include no chest pain, no abdominal pain and no shortness of breath.      Home Medications Prior to Admission medications   Medication Sig Start Date End Date Taking? Authorizing Provider  acetaminophen (TYLENOL) 500 MG tablet Take 2 tablets (1,000 mg total) by mouth every 6 (six) hours as needed (for pain scale < 4). 0000000   Arrie Senate, MD  amLODipine (NORVASC) 5 MG tablet Take 1 tablet (5 mg total) by mouth daily. 0000000   Arrie Senate, MD  amoxicillin-clavulanate (AUGMENTIN) 875-125 MG tablet Take 1 tablet by mouth every 12 (twelve) hours. 05/10/21   Teodora Medici, FNP  coconut oil OIL Apply 1 application topically as needed. 0000000   Arrie Senate, MD  ibuprofen (ADVIL) 600 MG tablet Take 1 tablet (600 mg total) by mouth  every 6 (six) hours as needed. 0000000   Arrie Senate, MD  Prenatal Vit-Fe Fumarate-FA (PREPLUS) 27-1 MG TABS Take 1 tablet by mouth daily. 06/21/20   Clarnce Flock, MD      Allergies    Patient has no known allergies.    Review of Systems   Review of Systems  Constitutional:  Negative for chills and fever.  HENT:  Positive for postnasal drip. Negative for sore throat.        Patient endorses ear itching bilaterally. Patient endorses itchy throat.  Denies pain  Eyes:  Positive for discharge, redness and itching.  Respiratory:  Negative for shortness of breath and wheezing.   Cardiovascular:  Negative for chest pain.  Gastrointestinal:  Negative for abdominal pain, blood in stool, diarrhea, nausea and vomiting.  Genitourinary:  Negative for pelvic pain, vaginal bleeding and vaginal discharge.  All other systems reviewed and are negative.  Physical Exam Updated Vital Signs BP 135/71 (BP Location: Left Arm)    Pulse 83    Temp 98.3 F (36.8 C) (Oral)    Resp 16    SpO2 100%  Physical Exam Vitals reviewed.  Constitutional:      General: She is not in acute distress.    Appearance: She is not ill-appearing or toxic-appearing.  HENT:     Head: Normocephalic and atraumatic.     Right Ear: Tympanic membrane normal. No drainage. No middle ear effusion. Tympanic  membrane is not erythematous.     Left Ear: Tympanic membrane normal. No drainage.  No middle ear effusion. Tympanic membrane is not erythematous.     Nose: Congestion and rhinorrhea present.     Mouth/Throat:     Pharynx: Uvula midline. Posterior oropharyngeal erythema present. No oropharyngeal exudate or uvula swelling.     Tonsils: No tonsillar exudate or tonsillar abscesses. 0 on the right. 0 on the left.  Eyes:     General: Allergic shiner present.     Extraocular Movements:     Right eye: Normal extraocular motion.     Left eye: Normal extraocular motion.     Pupils: Pupils are equal, round, and reactive to  light.     Comments: Bilateral conjunctivae are shiny with clear discharge.  Patient's eyes appear glassy.  Neck:     Thyroid: No thyromegaly.  Cardiovascular:     Rate and Rhythm: Normal rate and regular rhythm.  Pulmonary:     Effort: Pulmonary effort is normal.     Breath sounds: Normal breath sounds. No wheezing.  Abdominal:     General: Bowel sounds are normal. There is no distension.     Palpations: Abdomen is soft. There is no mass.     Tenderness: There is no abdominal tenderness. There is no rebound.  Musculoskeletal:     Cervical back: Normal range of motion and neck supple.  Lymphadenopathy:     Cervical: No cervical adenopathy.  Skin:    General: Skin is warm and dry.     Capillary Refill: Capillary refill takes less than 2 seconds.  Neurological:     General: No focal deficit present.     Mental Status: She is alert.    ED Results / Procedures / Treatments   Labs (all labs ordered are listed, but only abnormal results are displayed) Labs Reviewed  PREGNANCY, URINE - Abnormal; Notable for the following components:      Result Value   Preg Test, Ur POSITIVE (*)    All other components within normal limits  RESP PANEL BY RT-PCR (FLU A&B, COVID) [id number]    EKG None  Radiology No results found.  Procedures Procedures    Medications Ordered in ED Medications  loratadine (CLARITIN) tablet 10 mg (10 mg Oral Given 05/23/21 1009)    ED Course/ Medical Decision Making/ A&P                           Medical Decision Making Amount and/or Complexity of Data Reviewed Labs: ordered.  Risk OTC drugs.   26 year old female presents with 3 weeks of bilateral itching eyes, ears and itchy throat.  Patient states that she has been seen for this problem in urgent care but was instead given antibiotics for gum abscess.  Patient unable to tell me if she has taken antibiotic course to completion.  On examination, patient is afebrile, nonhypoxic, nontachycardic,  nontoxic-appearing.  Patient has erythematous turbinates bilaterally to nose, erythematous throat with no signs of exudate or swollen tonsils, patient handling secretions appropriately, no change in phonation.  Patient tympanic membrane's intact, no effusion noted bilaterally.  There is no erythema or tenderness to bilateral ears.  Patient conjunctive eye are glossy and have the appearance of allergic shiners bilaterally.  I believe most reasonable cause of patient's symptoms are allergies.  I have advised this patient to begin taking loratadine 10 mg daily and to follow-up with her PCP for ongoing management of  this problem.  I was going to prescribe this patient allergic conjunctivitis eyedrops.  Due to this medication being a pregnancy category C, I urine pregnancy tested the patient prior to administration.  The urine pregnancy test resulted in a positive result.  The patient denies any abdominal pain, vaginal bleeding, spotting.    Patient reports that her eye itching and throat itching has receded with use of 10mg  loratadine.   There are no safe treatment options for allergic conjunctivitis in an eyedrop form.  They are all pregnancy category C drugs.  I have discussed this with the patient and she voices understanding.  I have discussed the patient's newfound positive pregnancy result with her and she voices understanding.  I have advised her that she needs to follow-up with her OB/GYN in the next 3 to 5 days for management of her newfound pregnancy.  She voices understanding and agreement.  I have advised the patient to follow-up with her PCP in the next 7 to 10 days for ongoing management of her allergy issues.  She voices that she does not have a PCP so I will refer her to 1.  The patient was given strict return precautions.  Patient voices understanding with all precautions and directions.  The patient and her case was discussed with Dr. Zenia Resides who is in agreement with the plan.  The patient is  stable on discharge.   Final Clinical Impression(s) / ED Diagnoses Final diagnoses:  Allergic conjunctivitis of both eyes  Sore throat    Rx / DC Orders ED Discharge Orders     None         Lawana Chambers 05/23/21 1131    Lacretia Leigh, MD 05/26/21 1539

## 2021-07-19 ENCOUNTER — Telehealth: Payer: Self-pay

## 2021-07-19 DIAGNOSIS — Z3490 Encounter for supervision of normal pregnancy, unspecified, unspecified trimester: Secondary | ICD-10-CM

## 2021-07-19 NOTE — Telephone Encounter (Signed)
Patient called and spoke with front office staff [place]. Pt would like to schedule new OB appts. Pt states she is 9 months postpartum and breastfeeding. Has had single menstrual period since delivery. This was in January. Dating [place] scheduled for 07/27/21 @ 11 AM. Patient to arrive 30 minutes early. Patient will be scheduled for new OB appts following results.  ?

## 2021-07-27 ENCOUNTER — Other Ambulatory Visit: Payer: Self-pay | Admitting: *Deleted

## 2021-07-27 ENCOUNTER — Ambulatory Visit
Admission: RE | Admit: 2021-07-27 | Discharge: 2021-07-27 | Disposition: A | Discharge status: Home / Self Care | Payer: Medicaid Other | Source: Ambulatory Visit | Attending: Family Medicine | Admitting: Family Medicine

## 2021-07-27 ENCOUNTER — Other Ambulatory Visit: Payer: Self-pay

## 2021-07-27 ENCOUNTER — Other Ambulatory Visit: Payer: Self-pay | Admitting: Family Medicine

## 2021-07-27 ENCOUNTER — Ambulatory Visit (HOSPITAL_BASED_OUTPATIENT_CLINIC_OR_DEPARTMENT_OTHER): Payer: Medicaid Other

## 2021-07-27 DIAGNOSIS — Z3492 Encounter for supervision of normal pregnancy, unspecified, second trimester: Secondary | ICD-10-CM | POA: Diagnosis not present

## 2021-07-27 DIAGNOSIS — Z3687 Encounter for antenatal screening for uncertain dates: Secondary | ICD-10-CM | POA: Diagnosis not present

## 2021-07-27 DIAGNOSIS — Z363 Encounter for antenatal screening for malformations: Secondary | ICD-10-CM | POA: Insufficient documentation

## 2021-07-27 DIAGNOSIS — Z8759 Personal history of other complications of pregnancy, childbirth and the puerperium: Secondary | ICD-10-CM

## 2021-07-27 DIAGNOSIS — O34219 Maternal care for unspecified type scar from previous cesarean delivery: Secondary | ICD-10-CM | POA: Diagnosis not present

## 2021-07-27 DIAGNOSIS — O09292 Supervision of pregnancy with other poor reproductive or obstetric history, second trimester: Secondary | ICD-10-CM | POA: Insufficient documentation

## 2021-07-27 DIAGNOSIS — Z3A19 19 weeks gestation of pregnancy: Secondary | ICD-10-CM | POA: Insufficient documentation

## 2021-07-27 DIAGNOSIS — Z3490 Encounter for supervision of normal pregnancy, unspecified, unspecified trimester: Secondary | ICD-10-CM

## 2021-08-24 ENCOUNTER — Other Ambulatory Visit: Payer: Self-pay | Admitting: *Deleted

## 2021-08-24 ENCOUNTER — Ambulatory Visit: Payer: Medicaid Other | Admitting: *Deleted

## 2021-08-24 ENCOUNTER — Ambulatory Visit: Payer: Medicaid Other | Attending: Family Medicine

## 2021-08-24 VITALS — BP 119/71 | HR 86

## 2021-08-24 DIAGNOSIS — O09292 Supervision of pregnancy with other poor reproductive or obstetric history, second trimester: Secondary | ICD-10-CM

## 2021-08-24 DIAGNOSIS — O09892 Supervision of other high risk pregnancies, second trimester: Secondary | ICD-10-CM

## 2021-08-24 DIAGNOSIS — O34219 Maternal care for unspecified type scar from previous cesarean delivery: Secondary | ICD-10-CM | POA: Diagnosis present

## 2021-08-24 DIAGNOSIS — O0932 Supervision of pregnancy with insufficient antenatal care, second trimester: Secondary | ICD-10-CM

## 2021-08-24 DIAGNOSIS — O132 Gestational [pregnancy-induced] hypertension without significant proteinuria, second trimester: Secondary | ICD-10-CM | POA: Diagnosis present

## 2021-08-24 DIAGNOSIS — Z8759 Personal history of other complications of pregnancy, childbirth and the puerperium: Secondary | ICD-10-CM | POA: Insufficient documentation

## 2021-08-24 DIAGNOSIS — Z3A23 23 weeks gestation of pregnancy: Secondary | ICD-10-CM

## 2021-09-11 ENCOUNTER — Encounter: Payer: Self-pay | Admitting: Obstetrics & Gynecology

## 2021-09-14 ENCOUNTER — Telehealth: Payer: Self-pay

## 2021-09-18 ENCOUNTER — Encounter: Payer: Self-pay | Admitting: Obstetrics & Gynecology

## 2021-09-19 ENCOUNTER — Other Ambulatory Visit: Payer: Self-pay

## 2021-09-19 ENCOUNTER — Other Ambulatory Visit: Payer: 59

## 2021-09-19 DIAGNOSIS — O0990 Supervision of high risk pregnancy, unspecified, unspecified trimester: Secondary | ICD-10-CM

## 2021-09-19 DIAGNOSIS — Z6791 Unspecified blood type, Rh negative: Secondary | ICD-10-CM

## 2021-09-20 ENCOUNTER — Encounter: Payer: Self-pay | Admitting: *Deleted

## 2021-09-20 ENCOUNTER — Ambulatory Visit (HOSPITAL_BASED_OUTPATIENT_CLINIC_OR_DEPARTMENT_OTHER): Payer: Medicaid Other

## 2021-09-20 ENCOUNTER — Other Ambulatory Visit: Payer: Self-pay | Admitting: *Deleted

## 2021-09-20 ENCOUNTER — Ambulatory Visit: Payer: Medicaid Other | Attending: Obstetrics and Gynecology | Admitting: *Deleted

## 2021-09-20 VITALS — BP 133/68 | HR 67

## 2021-09-20 DIAGNOSIS — O402XX0 Polyhydramnios, second trimester, not applicable or unspecified: Secondary | ICD-10-CM | POA: Diagnosis present

## 2021-09-20 DIAGNOSIS — Z3A27 27 weeks gestation of pregnancy: Secondary | ICD-10-CM | POA: Diagnosis not present

## 2021-09-20 DIAGNOSIS — Z8759 Personal history of other complications of pregnancy, childbirth and the puerperium: Secondary | ICD-10-CM

## 2021-09-20 DIAGNOSIS — O1492 Unspecified pre-eclampsia, second trimester: Secondary | ICD-10-CM | POA: Diagnosis not present

## 2021-09-20 DIAGNOSIS — O09292 Supervision of pregnancy with other poor reproductive or obstetric history, second trimester: Secondary | ICD-10-CM | POA: Diagnosis not present

## 2021-09-20 DIAGNOSIS — Z362 Encounter for other antenatal screening follow-up: Secondary | ICD-10-CM

## 2021-09-20 DIAGNOSIS — O34219 Maternal care for unspecified type scar from previous cesarean delivery: Secondary | ICD-10-CM | POA: Diagnosis not present

## 2021-09-20 DIAGNOSIS — O0932 Supervision of pregnancy with insufficient antenatal care, second trimester: Secondary | ICD-10-CM

## 2021-09-20 DIAGNOSIS — O09892 Supervision of other high risk pregnancies, second trimester: Secondary | ICD-10-CM

## 2021-09-20 LAB — ANTIBODY SCREEN: Antibody Screen: NEGATIVE

## 2021-09-20 LAB — GLUCOSE TOLERANCE, 2 HOURS W/ 1HR
Glucose, 1 hour: 113 mg/dL (ref 70–179)
Glucose, 2 hour: 92 mg/dL (ref 70–152)
Glucose, Fasting: 81 mg/dL (ref 70–91)

## 2021-09-20 LAB — CBC
Hematocrit: 34.4 % (ref 34.0–46.6)
Hemoglobin: 11.7 g/dL (ref 11.1–15.9)
MCH: 28.2 pg (ref 26.6–33.0)
MCHC: 34 g/dL (ref 31.5–35.7)
MCV: 83 fL (ref 79–97)
Platelets: 177 10*3/uL (ref 150–450)
RBC: 4.15 x10E6/uL (ref 3.77–5.28)
RDW: 13.1 % (ref 11.7–15.4)
WBC: 6.9 10*3/uL (ref 3.4–10.8)

## 2021-09-20 LAB — HIV ANTIBODY (ROUTINE TESTING W REFLEX): HIV Screen 4th Generation wRfx: NONREACTIVE

## 2021-09-20 LAB — RPR: RPR Ser Ql: NONREACTIVE

## 2021-09-21 ENCOUNTER — Ambulatory Visit: Payer: Self-pay

## 2021-10-25 ENCOUNTER — Encounter: Payer: Self-pay | Admitting: Obstetrics & Gynecology

## 2021-10-25 ENCOUNTER — Ambulatory Visit: Payer: 59

## 2021-10-25 ENCOUNTER — Ambulatory Visit: Payer: 59 | Attending: Obstetrics

## 2021-11-06 ENCOUNTER — Telehealth: Payer: Self-pay | Admitting: *Deleted

## 2021-11-06 ENCOUNTER — Encounter: Payer: Self-pay | Admitting: *Deleted

## 2021-11-06 ENCOUNTER — Telehealth: Payer: Self-pay | Admitting: General Practice

## 2021-11-06 NOTE — Telephone Encounter (Signed)
Received fax from [facility] that patient called 11/02/21 and reported fluid around her belly and having contractions. She was told to go to the hospital for evaluation. Per chart review I cannot see that she has gone anywhere in [place] for evaluation or elsewhere in [facility].  She has not yet had her new ob in our office. I called her to follow up and left a voice message I was calling to follow up from her phone call and do not see that she has went to hospital as recommended. Asked that she call us if still having issues or go to hospital [facility]

## 2021-11-06 NOTE — Telephone Encounter (Signed)
Patient called into front office stating she is returning our phone call. Told patient one of our nurses was calling to check in on her since she called last week and reported contractions. Patient states she is feeling better since then. She states she was calling to get an appt because she was told she needed to be seen weekly because of the fluid around the baby. Reviewed missed appt in our office with [place] and MFM on 6/21. Discussed need to call MFM to reschedule missed ultrasound appt otherwise we don't know if the fluid has worsened or improved. Informed her of new OB intake appt in our office on 7/6 @ 1015. Patient verbalized understanding.

## 2021-11-06 NOTE — Telephone Encounter (Signed)
Patient called back and left message with registrar she is returning our call. I called her back and left another message that I was returning her call. I also said she may send [place] a MyChart message or call us if needed but if having severe issues like leaking fluid or having frequent contractions to go to hospital for evaluation. Nancy Fetter

## 2021-11-09 ENCOUNTER — Other Ambulatory Visit: Payer: Self-pay

## 2021-11-09 ENCOUNTER — Ambulatory Visit (INDEPENDENT_AMBULATORY_CARE_PROVIDER_SITE_OTHER): Payer: Medicaid Other | Admitting: Family Medicine

## 2021-11-09 ENCOUNTER — Other Ambulatory Visit (HOSPITAL_COMMUNITY)
Admission: RE | Admit: 2021-11-09 | Discharge: 2021-11-09 | Disposition: A | Discharge status: Home / Self Care | Payer: Medicaid Other | Source: Ambulatory Visit | Attending: Family Medicine | Admitting: Family Medicine

## 2021-11-09 VITALS — BP 112/75 | HR 84 | Wt 178.5 lb

## 2021-11-09 DIAGNOSIS — Z8759 Personal history of other complications of pregnancy, childbirth and the puerperium: Secondary | ICD-10-CM

## 2021-11-09 DIAGNOSIS — O403XX0 Polyhydramnios, third trimester, not applicable or unspecified: Secondary | ICD-10-CM

## 2021-11-09 DIAGNOSIS — Z6791 Unspecified blood type, Rh negative: Secondary | ICD-10-CM

## 2021-11-09 DIAGNOSIS — O0933 Supervision of pregnancy with insufficient antenatal care, third trimester: Secondary | ICD-10-CM

## 2021-11-09 DIAGNOSIS — R87612 Low grade squamous intraepithelial lesion on cytologic smear of cervix (LGSIL): Secondary | ICD-10-CM

## 2021-11-09 DIAGNOSIS — Z641 Problems related to multiparity: Secondary | ICD-10-CM

## 2021-11-09 DIAGNOSIS — O0990 Supervision of high risk pregnancy, unspecified, unspecified trimester: Secondary | ICD-10-CM | POA: Diagnosis not present

## 2021-11-09 DIAGNOSIS — Z3A34 34 weeks gestation of pregnancy: Secondary | ICD-10-CM | POA: Diagnosis not present

## 2021-11-09 DIAGNOSIS — O26899 Other specified pregnancy related conditions, unspecified trimester: Secondary | ICD-10-CM

## 2021-11-09 DIAGNOSIS — K047 Periapical abscess without sinus: Secondary | ICD-10-CM

## 2021-11-09 DIAGNOSIS — O34219 Maternal care for unspecified type scar from previous cesarean delivery: Secondary | ICD-10-CM

## 2021-11-09 DIAGNOSIS — Z98891 History of uterine scar from previous surgery: Secondary | ICD-10-CM

## 2021-11-09 MED ORDER — RHO D IMMUNE GLOBULIN 1500 UNIT/2ML IJ SOSY
300.0000 ug | PREFILLED_SYRINGE | Freq: Once | INTRAMUSCULAR | Status: AC
  Administered 2021-11-09: 300 ug via INTRAMUSCULAR

## 2021-11-09 MED ORDER — PENICILLIN V POTASSIUM 500 MG PO TABS: 500.0000 mg | ORAL_TABLET | Freq: Four times a day (QID) | ORAL | 0 refills | Status: AC

## 2021-11-09 NOTE — Progress Notes (Signed)
Subjective:   Samantha Hood is a 26 y.o. [id number] at [redacted]w[redacted]d by midtrimester ultrasound being seen today for her first obstetrical visit.  Her obstetrical history is significant for pregnancy induced hypertension and previous c-section . Patient does not intend to breast feed. Pregnancy history fully reviewed.  Patient reports  tooth pain .  HISTORY: OB History  Gravida Para Term Preterm AB Living  7 5 5  0 1 5  SAB IAB Ectopic Multiple Live Births  0 0 0 0 5    # Outcome Date GA Lbr Len/2nd Weight Sex Delivery Anes PTL Lv  7 Current           6 Term 10/15/20 [redacted]w[redacted]d 09:25 / 00:09 6 lb 2.8 oz (2.801 kg) F VBAC EPI  LIV     Name: Hood,GIRL Samantha     Apgar1: 8  Apgar5: 9  5 Term 06/21/19 [redacted]w[redacted]d 12:50 / 00:09 6 lb 6.1 oz (2.895 kg) F Vag-Spont EPI  LIV     Name: Samantha, Hood     Apgar1: 8  Apgar5: 9  4 AB 2019          3 Term 11/28/16 [redacted]w[redacted]d  7 lb 10 oz (3.459 kg) M VBAC EPI  LIV     Birth Comments: no complications  2 Term 05/16/15 [redacted]w[redacted]d  5 lb 6 oz (2.438 kg) F CS-LTranv EPI  LIV     Birth Comments: no complications     Complications: Fetal distress affecting care  1 Term 12/12/12 [redacted]w[redacted]d  7 lb 1 oz (3.204 kg) M Vag-Spont EPI N LIV     Birth Comments: no complications   Last pap smear was 06/2020 and was abnormal - LGSIL Past Medical History:  Diagnosis Date   Anemia    Chlamydia vaginitis/cervicitis 01/13/2015   Medical history non-contributory    Patient denies medical problems    Past Surgical History:  Procedure Laterality Date   CESAREAN SECTION     ORIF MANDIBULAR FRACTURE N/A 04/15/2018   Procedure: OPEN REDUCTION INTERNAL FIXATION (ORIF) MANDIBULAR FRACTURE;  Surgeon: 14/02/2018, DDS;  Location: [facility] OR;  Service: Oral Surgery;  Laterality: N/A;   Family History  Problem Relation Age of Onset   Hypertension Mother    Hypertension Maternal Grandmother    Asthma Neg Hx    Cancer Neg Hx    Diabetes Neg Hx    Kidney disease Neg Hx    Obesity Neg Hx     Social History   Tobacco Use   Smoking status: Never   Smokeless tobacco: Never  Vaping Use   Vaping Use: Never used  Substance Use Topics   Alcohol use: Not Currently    Comment: socially    Drug use: Never   No Known Allergies Current Outpatient Medications on File Prior to Visit  Medication Sig Dispense Refill   Prenatal Vit-Fe Fumarate-FA (PREPLUS) 27-1 MG TABS Take 1 tablet by mouth daily. 30 tablet 6   No current facility-administered medications on file prior to visit.     Exam   Vitals:   11/09/21 1054  BP: 112/75  Pulse: 84  Weight: 178 lb 8 oz (81 kg)   Fetal Heart Rate (bpm): 145  Pelvic Exam: Perineum: no hemorrhoids, normal perineum   Vulva: normal external genitalia, no lesions   Vagina:  normal mucosa, normal discharge   Cervix: no lesions and normal, pap smear done.    Adnexa: normal adnexa and no mass, fullness, tenderness   Bony  Pelvis: average  System: General: well-developed, well-nourished female in no acute distress   Skin: normal coloration and turgor, no rashes   Neurologic: oriented, normal, negative, normal mood   Extremities: normal strength, tone, and muscle mass, ROM of all joints is normal   HEENT PERRLA, extraocular movement intact and sclera clear, anicteric   Mouth/Teeth mucous membranes moist, pharynx normal without lesions and dental hygiene good   Neck supple and no masses   Cardiovascular: regular rate and rhythm   Respiratory:  no respiratory distress, normal breath sounds   Abdomen: soft, non-tender; bowel sounds normal; no masses,  no organomegaly     Assessment:   Pregnancy: [id number] Patient Active Problem List   Diagnosis Date Noted   History of gestational hypertension 10/14/2020   Short interval between pregnancies affecting pregnancy, antepartum 10/14/2020   Insufficient prenatal care 10/14/2020   Pap smear abnormality of cervix with LGSIL, +HPV 06/25/2020   Rh negative state in antepartum period 06/22/2020    Previous cesarean section complicating pregnancy, antepartum condition or complication 02/18/2019   Supervision of high risk pregnancy, antepartum 01/30/2019   History of VBAC 07/16/2016     Plan:  1. Supervision of high risk pregnancy, antepartum New OB labs today - GC/Chlamydia probe amp ([place])not at [facility] - Culture, OB Urine - CBC/D/Plt+RPR+Rh+ABO+RubIgG...  2. Previous cesarean section complicating pregnancy, antepartum condition or complication Desires VBAC--H/o VBAC x 3 Consent signed  3. Rh negative state in antepartum period S/p Rhogam today - rho (d) immune globulin (RHIG/RHOPHYLAC) injection 300 mcg  4. Insufficient prenatal care in third trimester Late to care at 34 weeks  5. History of VBAC   6. Pap smear abnormality of cervix with LGSIL, +HPV Repeat today - Cytology - PAP( Samantha Hood)  7. History of gestational hypertension BP is currently normal  8. Tooth abscess Rx for Abx given - penicillin v potassium (VEETID) 500 MG tablet; Take 1 tablet (500 mg total) by mouth 4 (four) times daily for 7 days.  Dispense: 28 tablet; Refill: 0  9. Grand multipara At risk of PPH Desires BTL--consent signed today  10. Polyhydramnios affecting pregnancy in third trimester For f/u u/s next week  Initial labs drawn. Continue prenatal vitamins. Genetic Screening discussed, NIPS:  too late . Ultrasound discussed; fetal anatomic survey: results reviewed has f/u u/s next week. Problem list reviewed and updated. The nature of  - [facility] with multiple MDs and other Advanced Practice Providers was explained to patient; also emphasized that residents, students are part of our team. Routine obstetric precautions reviewed. Return in about 2 weeks (around 11/23/2021) for [facility].

## 2021-11-09 NOTE — Progress Notes (Signed)
New OB Intake  I connected with  Samantha Hood on 11/09/21 at 10:15 AM EDT by In person visit and verified that I am speaking with the correct person using two identifiers. Nurse is located at [facility] and pt is located at [facility].  I discussed the limitations, risks, security and privacy concerns of performing an evaluation and management service by telephone and the availability of in person appointments. I also discussed with the patient that there may be a patient responsible charge related to this service. The patient expressed understanding and agreed to proceed.  I explained I am completing New OB Intake today. We discussed her EDD of 12/15/21 that is based on U/S on 08/24/21. Pt is G7/P5. I reviewed her allergies, medications, Medical/Surgical/OB history, and appropriate screenings. I informed her of [facility] services. Based on history, this is a/an  pregnancy complicated by RH Negative  .   Patient Active Problem List   Diagnosis Date Noted   Gestational hypertension 10/14/2020   Short interval between pregnancies affecting pregnancy, antepartum 10/14/2020   Insufficient prenatal care 10/14/2020   Pap smear abnormality of cervix with LGSIL, +HPV 06/25/2020   Rh negative state in antepartum period 06/22/2020   Previous cesarean section complicating pregnancy, antepartum condition or complication 02/18/2019   Supervision of high risk pregnancy, antepartum 01/30/2019   History of VBAC 07/16/2016    Concerns addressed today  Delivery Plans:  Plans to deliver at [facility].   MyChart/Babyscripts MyChart access verified. I explained pt will have some visits in office and some virtually. Babyscripts instructions given and order placed. Patient verifies receipt of registration text/e-mail. Account successfully created and app downloaded.  Blood Pressure Cuff  Patient is self-pay; explained patient will be given BP cuff at first prenatal appt. Explained after first prenatal appt pt will check  weekly and document in Babyscripts.  Weight scale: Patient does / does not  have weight scale. Weight scale ordered for patient to pick up from [employer].   Anatomy US Explained first scheduled [place] will be around 19 weeks. Anatomy US scheduled for 11/21/21 at 0345. Pt notified to arrive at 0330. Scheduled AFP lab only appointment if CenteringPregnancy pt for same day as anatomy US.   Labs Discussed Avelina Laine genetic screening with patient. Would like both Panorama and Horizon drawn at new OB visit.Also if interested in genetic testing, tell patient she will need AFP 15-21 weeks to complete genetic testing .Routine prenatal labs needed.  Covid Vaccine Patient has covid vaccine.   Is patient a CenteringPregnancy candidate?  Not a Candidate Declined due to NA Not a candidate due to NA Centering Patient" indicated on sticky note   Is patient a Mom+Baby Combined Care candidate?  Not a candidate    Scheduled with Mom+Baby provider    Is patient interested in Waterbirth?  No   "Interested in [employer] - Schedule next visit with CNM" on sticky note  Informed patient of [facility] website  and placed link in her AVS.   Social Determinants of Health Food Insecurity: Patient denies food insecurity. WIC Referral: Patient is interested in referral to [facility].  Transportation: Patient denies transportation needs. Childcare: Discussed no children allowed at ultrasound appointments. Offered childcare services; patient declines childcare services at this time.  Send link to Pregnancy Navigators   Placed OB Box on problem list and updated  First visit review I reviewed new OB appt with pt. I explained she will have a pelvic exam, ob bloodwork with genetic  screening, and PAP smear. Explained pt will be seen by Dr. Shawnie Pons at first visit; encounter routed to appropriate provider. Explained that patient will be seen by pregnancy navigator following visit with provider. [facility] information placed in AVS.    Henrietta Dine, CMA 11/09/2021  10:23 AM

## 2021-11-09 NOTE — Progress Notes (Signed)
Rhogam given on 11/09/21

## 2021-11-10 LAB — GC/CHLAMYDIA PROBE AMP (~~LOC~~) NOT AT ARMC
Chlamydia: NEGATIVE
Comment: NEGATIVE
Comment: NORMAL
Neisseria Gonorrhea: NEGATIVE

## 2021-11-10 LAB — CBC/D/PLT+RPR+RH+ABO+RUBIGG...
Antibody Screen: NEGATIVE
Basophils Absolute: 0 10*3/uL (ref 0.0–0.2)
Basos: 0 %
EOS (ABSOLUTE): 0.2 10*3/uL (ref 0.0–0.4)
Eos: 3 %
HCV Ab: NONREACTIVE
HIV Screen 4th Generation wRfx: NONREACTIVE
Hematocrit: 35.4 % (ref 34.0–46.6)
Hemoglobin: 11.8 g/dL (ref 11.1–15.9)
Hepatitis B Surface Ag: NEGATIVE
Immature Grans (Abs): 0 10*3/uL (ref 0.0–0.1)
Immature Granulocytes: 0 %
Lymphocytes Absolute: 1.7 10*3/uL (ref 0.7–3.1)
Lymphs: 24 %
MCH: 27.1 pg (ref 26.6–33.0)
MCHC: 33.3 g/dL (ref 31.5–35.7)
MCV: 81 fL (ref 79–97)
Monocytes Absolute: 0.6 10*3/uL (ref 0.1–0.9)
Monocytes: 8 %
Neutrophils Absolute: 4.5 10*3/uL (ref 1.4–7.0)
Neutrophils: 65 %
Platelets: 141 10*3/uL — ABNORMAL LOW (ref 150–450)
RBC: 4.36 x10E6/uL (ref 3.77–5.28)
RDW: 13 % (ref 11.7–15.4)
RPR Ser Ql: NONREACTIVE
Rh Factor: NEGATIVE
Rubella Antibodies, IGG: 1.55 index (ref >0.99)
WBC: 7 10*3/uL (ref 3.4–10.8)

## 2021-11-10 LAB — HCV INTERPRETATION

## 2021-11-11 LAB — CULTURE, OB URINE

## 2021-11-11 LAB — URINE CULTURE, OB REFLEX

## 2021-11-14 LAB — CYTOLOGY - PAP
Comment: NEGATIVE
Comment: NEGATIVE
Comment: NEGATIVE
Diagnosis: UNDETERMINED — AB
HPV 16: NEGATIVE
HPV 18 / 45: NEGATIVE
High risk HPV: POSITIVE — AB

## 2021-11-15 ENCOUNTER — Telehealth: Payer: Self-pay

## 2021-11-15 NOTE — Telephone Encounter (Signed)
Called Pt to ask if she would come back to the office & re-sign Tubal form, no answer, left VM.Pt failed to include middle Initial. My Chart message sent also.

## 2021-11-16 ENCOUNTER — Telehealth: Payer: Self-pay

## 2021-11-16 NOTE — Telephone Encounter (Signed)
Called Pt again to ask if she could stop by to re-sign BTL form, no answer, left VM.

## 2021-11-21 ENCOUNTER — Ambulatory Visit: Payer: Medicaid Other | Attending: Obstetrics

## 2021-11-21 ENCOUNTER — Ambulatory Visit: Payer: Medicaid Other | Admitting: *Deleted

## 2021-11-21 VITALS — BP 133/68 | HR 67

## 2021-11-21 DIAGNOSIS — O34219 Maternal care for unspecified type scar from previous cesarean delivery: Secondary | ICD-10-CM | POA: Diagnosis not present

## 2021-11-21 DIAGNOSIS — Z641 Problems related to multiparity: Secondary | ICD-10-CM | POA: Insufficient documentation

## 2021-11-21 DIAGNOSIS — Z3A36 36 weeks gestation of pregnancy: Secondary | ICD-10-CM

## 2021-11-21 DIAGNOSIS — Z8759 Personal history of other complications of pregnancy, childbirth and the puerperium: Secondary | ICD-10-CM | POA: Insufficient documentation

## 2021-11-21 DIAGNOSIS — O402XX0 Polyhydramnios, second trimester, not applicable or unspecified: Secondary | ICD-10-CM | POA: Insufficient documentation

## 2021-11-21 DIAGNOSIS — O09293 Supervision of pregnancy with other poor reproductive or obstetric history, third trimester: Secondary | ICD-10-CM

## 2021-11-21 DIAGNOSIS — O0933 Supervision of pregnancy with insufficient antenatal care, third trimester: Secondary | ICD-10-CM

## 2021-11-27 ENCOUNTER — Inpatient Hospital Stay (HOSPITAL_COMMUNITY)
Admission: AD | Admit: 2021-11-27 | Discharge: 2021-11-27 | Disposition: A | Discharge status: Home / Self Care | Payer: Medicaid Other | Attending: Obstetrics & Gynecology | Admitting: Obstetrics & Gynecology

## 2021-11-27 ENCOUNTER — Encounter (HOSPITAL_COMMUNITY): Payer: Self-pay | Admitting: Obstetrics & Gynecology

## 2021-11-27 DIAGNOSIS — O479 False labor, unspecified: Secondary | ICD-10-CM

## 2021-11-27 DIAGNOSIS — O471 False labor at or after 37 completed weeks of gestation: Secondary | ICD-10-CM | POA: Diagnosis present

## 2021-11-27 DIAGNOSIS — Z3A37 37 weeks gestation of pregnancy: Secondary | ICD-10-CM | POA: Diagnosis not present

## 2021-11-27 NOTE — MAU Provider Note (Signed)
Ms. Samantha Hood is a [id number] at [redacted]w[redacted]d seen in MAU for labor. RN labor check, not seen by provider. SVE by RN Dilation: 1.5 Effacement (%): 20 Station: -3 Exam by:: Georgina Snell, RN  Cervix unchanged on re-exam  REACTIVE NST - FHR: 135 bpm / moderate variability / accels present / decels absent / TOCO: irregular every 4-7 mins   Plan:  D/C home with labor precautions Keep scheduled appt with [facility] on 12/07/2021  Raelyn Mora, CNM  11/27/2021 5:24 AM

## 2021-11-27 NOTE — MAU Note (Signed)
.  Samantha Hood is a 26 y.o. at [redacted]w[redacted]d here in MAU reporting: regular ctx starting two hours ago (8/10). Denies VB or LOF. Reports good FM. Denies any PIH symptoms or history of HBP.  Onset of complaint: today Pain score: 8/10 Vitals:   11/27/21 0403  BP: (!) 145/84  Pulse: (!) 102  Resp: 18  Temp: 98.4 F (36.9 C)  SpO2: 100%     FHT:135 Lab orders placed from triage:  mau labor

## 2021-12-07 ENCOUNTER — Ambulatory Visit (INDEPENDENT_AMBULATORY_CARE_PROVIDER_SITE_OTHER): Payer: 59 | Admitting: Family Medicine

## 2021-12-07 ENCOUNTER — Other Ambulatory Visit: Payer: Self-pay

## 2021-12-07 VITALS — BP 118/81 | HR 93 | Wt 183.2 lb

## 2021-12-07 DIAGNOSIS — O0990 Supervision of high risk pregnancy, unspecified, unspecified trimester: Secondary | ICD-10-CM | POA: Diagnosis not present

## 2021-12-07 DIAGNOSIS — Z3A38 38 weeks gestation of pregnancy: Secondary | ICD-10-CM | POA: Diagnosis not present

## 2021-12-07 NOTE — Progress Notes (Signed)
   PRENATAL VISIT NOTE  Subjective:  Samantha Hood is a 26 y.o. [id number] at [redacted]w[redacted]d being seen today for ongoing prenatal care.  She is currently monitored for the following issues for this low-risk pregnancy and has History of VBAC; Supervision of high risk pregnancy, antepartum; Previous cesarean section complicating pregnancy, antepartum condition or complication; Rh negative state in antepartum period; Pap smear abnormality of cervix with LGSIL, +HPV; History of gestational hypertension; Short interval between pregnancies affecting pregnancy, antepartum; Insufficient prenatal care; Grand multipara; and Polyhydramnios affecting pregnancy in third trimester on their problem list.  Patient reports no complaints.  Contractions: Irritability. Vag. Bleeding: None.  Movement: Present. Denies leaking of fluid.   The following portions of the patient's history were reviewed and updated as appropriate: allergies, current medications, past family history, past medical history, past social history, past surgical history and problem list.   Objective:   Vitals:   12/07/21 0906  BP: 118/81  Pulse: 93  Weight: 183 lb 3.2 oz (83.1 kg)    Fetal Status: Fetal Heart Rate (bpm): 132 Fundal Height: 35 cm Movement: Present  Presentation: Vertex  General:  Alert, oriented and cooperative. Patient is in no acute distress.  Skin: Skin is warm and dry. No rash noted.   Cardiovascular: Normal heart rate noted  Respiratory: Normal respiratory effort, no problems with respiration noted  Abdomen: Soft, gravid, appropriate for gestational age.  Pain/Pressure: Present     Pelvic: Cervical exam performed in the presence of a chaperone Dilation: 1.5 Effacement (%): 20, 30 Station: -3  Extremities: Normal range of motion.  Edema: None  Mental Status: Normal mood and affect. Normal behavior. Normal judgment and thought content.   Assessment and Plan:  Pregnancy: [id number] at [redacted]w[redacted]d 1. Supervision of high risk pregnancy,  antepartum Cultures today - Culture, beta strep (group b only)  Term labor symptoms and general obstetric precautions including but not limited to vaginal bleeding, contractions, leaking of fluid and fetal movement were reviewed in detail with the patient. Please refer to After Visit Summary for other counseling recommendations.   Return in 1 week (on 12/14/2021).  No future appointments.  Reva Bores, MD

## 2021-12-10 ENCOUNTER — Encounter: Payer: Self-pay | Admitting: Family Medicine

## 2021-12-10 LAB — CULTURE, BETA STREP (GROUP B ONLY): Strep Gp B Culture: POSITIVE — AB

## 2021-12-15 ENCOUNTER — Inpatient Hospital Stay (HOSPITAL_COMMUNITY): Admission: AD | Admit: 2021-12-15 | Payer: 59 | Source: Ambulatory Visit | Admitting: Family Medicine

## 2021-12-15 ENCOUNTER — Encounter (HOSPITAL_COMMUNITY): Payer: Self-pay | Admitting: Family Medicine

## 2021-12-15 ENCOUNTER — Inpatient Hospital Stay (HOSPITAL_COMMUNITY)
Admission: AD | Admit: 2021-12-15 | Discharge: 2021-12-17 | DRG: 807 | Disposition: A | Discharge status: Home / Self Care | Payer: Medicaid Other | Source: Ambulatory Visit | Attending: Obstetrics & Gynecology | Admitting: Obstetrics & Gynecology

## 2021-12-15 ENCOUNTER — Ambulatory Visit (INDEPENDENT_AMBULATORY_CARE_PROVIDER_SITE_OTHER): Payer: 59 | Admitting: Family Medicine

## 2021-12-15 VITALS — BP 146/93 | HR 89 | Wt 185.1 lb

## 2021-12-15 DIAGNOSIS — O403XX0 Polyhydramnios, third trimester, not applicable or unspecified: Secondary | ICD-10-CM | POA: Diagnosis present

## 2021-12-15 DIAGNOSIS — O34211 Maternal care for low transverse scar from previous cesarean delivery: Secondary | ICD-10-CM | POA: Diagnosis not present

## 2021-12-15 DIAGNOSIS — Z3490 Encounter for supervision of normal pregnancy, unspecified, unspecified trimester: Secondary | ICD-10-CM | POA: Diagnosis present

## 2021-12-15 DIAGNOSIS — O99820 Streptococcus B carrier state complicating pregnancy: Secondary | ICD-10-CM

## 2021-12-15 DIAGNOSIS — O99824 Streptococcus B carrier state complicating childbirth: Secondary | ICD-10-CM | POA: Diagnosis present

## 2021-12-15 DIAGNOSIS — O0990 Supervision of high risk pregnancy, unspecified, unspecified trimester: Secondary | ICD-10-CM | POA: Diagnosis not present

## 2021-12-15 DIAGNOSIS — O134 Gestational [pregnancy-induced] hypertension without significant proteinuria, complicating childbirth: Secondary | ICD-10-CM | POA: Diagnosis present

## 2021-12-15 DIAGNOSIS — Z98891 History of uterine scar from previous surgery: Secondary | ICD-10-CM

## 2021-12-15 DIAGNOSIS — O133 Gestational [pregnancy-induced] hypertension without significant proteinuria, third trimester: Secondary | ICD-10-CM | POA: Insufficient documentation

## 2021-12-15 DIAGNOSIS — O0933 Supervision of pregnancy with insufficient antenatal care, third trimester: Secondary | ICD-10-CM | POA: Diagnosis not present

## 2021-12-15 DIAGNOSIS — Z6791 Unspecified blood type, Rh negative: Secondary | ICD-10-CM

## 2021-12-15 DIAGNOSIS — Z3A40 40 weeks gestation of pregnancy: Secondary | ICD-10-CM

## 2021-12-15 DIAGNOSIS — O34219 Maternal care for unspecified type scar from previous cesarean delivery: Secondary | ICD-10-CM | POA: Diagnosis present

## 2021-12-15 DIAGNOSIS — Z641 Problems related to multiparity: Secondary | ICD-10-CM

## 2021-12-15 DIAGNOSIS — O26899 Other specified pregnancy related conditions, unspecified trimester: Secondary | ICD-10-CM | POA: Diagnosis not present

## 2021-12-15 DIAGNOSIS — B951 Streptococcus, group B, as the cause of diseases classified elsewhere: Secondary | ICD-10-CM

## 2021-12-15 DIAGNOSIS — O163 Unspecified maternal hypertension, third trimester: Secondary | ICD-10-CM

## 2021-12-15 DIAGNOSIS — Z8759 Personal history of other complications of pregnancy, childbirth and the puerperium: Secondary | ICD-10-CM

## 2021-12-15 DIAGNOSIS — O26893 Other specified pregnancy related conditions, third trimester: Secondary | ICD-10-CM | POA: Diagnosis present

## 2021-12-15 DIAGNOSIS — O480 Post-term pregnancy: Secondary | ICD-10-CM | POA: Diagnosis not present

## 2021-12-15 LAB — CBC
HCT: 35.4 % — ABNORMAL LOW (ref 36.0–46.0)
Hemoglobin: 11.8 g/dL — ABNORMAL LOW (ref 12.0–15.0)
MCH: 27.3 pg (ref 26.0–34.0)
MCHC: 33.3 g/dL (ref 30.0–36.0)
MCV: 81.9 fL (ref 80.0–100.0)
Platelets: 128 10*3/uL — ABNORMAL LOW (ref 150–400)
RBC: 4.32 MIL/uL (ref 3.87–5.11)
RDW: 15.6 % — ABNORMAL HIGH (ref 11.5–15.5)
WBC: 7.2 10*3/uL (ref 4.0–10.5)
nRBC: 0 % (ref 0.0–0.2)

## 2021-12-15 LAB — COMPREHENSIVE METABOLIC PANEL
ALT: 14 U/L (ref 0–44)
AST: 21 U/L (ref 15–41)
Albumin: 2.8 g/dL — ABNORMAL LOW (ref 3.5–5.0)
Alkaline Phosphatase: 168 U/L — ABNORMAL HIGH (ref 38–126)
Anion gap: 7 (ref 5–15)
BUN: 6 mg/dL (ref 6–20)
CO2: 21 mmol/L — ABNORMAL LOW (ref 22–32)
Calcium: 8.8 mg/dL — ABNORMAL LOW (ref 8.9–10.3)
Chloride: 109 mmol/L (ref 98–111)
Creatinine, Ser: 0.62 mg/dL (ref 0.44–1.00)
GFR, Estimated: >60 mL/min (ref >60)
Glucose, Bld: 78 mg/dL (ref 70–99)
Potassium: 4.2 mmol/L (ref 3.5–5.1)
Sodium: 137 mmol/L (ref 135–145)
Total Bilirubin: 0.3 mg/dL (ref 0.3–1.2)
Total Protein: 6.1 g/dL — ABNORMAL LOW (ref 6.5–8.1)

## 2021-12-15 MED ORDER — ACETAMINOPHEN 325 MG PO TABS
650.0000 mg | ORAL_TABLET | ORAL | Status: DC | PRN
  Administered 2021-12-16: 650 mg via ORAL
  Filled 2021-12-15: qty 2

## 2021-12-15 MED ORDER — LACTATED RINGERS IV SOLN
INTRAVENOUS | Status: DC
Start: 2021-12-15 — End: 2021-12-16

## 2021-12-15 MED ORDER — ONDANSETRON HCL 4 MG/2ML IJ SOLN: 4.0000 mg | Freq: Four times a day (QID) | INTRAMUSCULAR | Status: DC | PRN

## 2021-12-15 MED ORDER — LABETALOL HCL 5 MG/ML IV SOLN: 80.0000 mg | INTRAVENOUS | Status: DC | PRN

## 2021-12-15 MED ORDER — TERBUTALINE SULFATE 1 MG/ML IJ SOLN
0.2500 mg | Freq: Once | INTRAMUSCULAR | Status: AC
Start: 2021-12-15 — End: 2021-12-15
  Administered 2021-12-15: 0.25 mg via SUBCUTANEOUS

## 2021-12-15 MED ORDER — OXYCODONE-ACETAMINOPHEN 5-325 MG PO TABS: 2.0000 | ORAL_TABLET | ORAL | Status: DC | PRN

## 2021-12-15 MED ORDER — TERBUTALINE SULFATE 1 MG/ML IJ SOLN
0.2500 mg | Freq: Once | INTRAMUSCULAR | Status: AC | PRN
  Administered 2021-12-15: 0.25 mg via SUBCUTANEOUS
  Filled 2021-12-15: qty 1

## 2021-12-15 MED ORDER — OXYCODONE-ACETAMINOPHEN 5-325 MG PO TABS: 1.0000 | ORAL_TABLET | ORAL | Status: DC | PRN

## 2021-12-15 MED ORDER — SODIUM CHLORIDE 0.9 % IV SOLN
5.0000 10*6.[IU] | Freq: Once | INTRAVENOUS | Status: AC
  Administered 2021-12-15: 5 10*6.[IU] via INTRAVENOUS
  Filled 2021-12-15: qty 5

## 2021-12-15 MED ORDER — OXYTOCIN-SODIUM CHLORIDE 30-0.9 UT/500ML-% IV SOLN
2.5000 [IU]/h | INTRAVENOUS | Status: DC
  Filled 2021-12-15: qty 500

## 2021-12-15 MED ORDER — OXYTOCIN BOLUS FROM INFUSION
333.0000 mL | Freq: Once | INTRAVENOUS | Status: AC
  Administered 2021-12-16: 333 mL via INTRAVENOUS

## 2021-12-15 MED ORDER — LABETALOL HCL 5 MG/ML IV SOLN
40.0000 mg | INTRAVENOUS | Status: DC | PRN
Start: 2021-12-15 — End: 2021-12-16

## 2021-12-15 MED ORDER — PENICILLIN G POT IN DEXTROSE 60000 UNIT/ML IV SOLN
3.0000 10*6.[IU] | INTRAVENOUS | Status: DC
  Administered 2021-12-15 – 2021-12-16 (×2): 3 10*6.[IU] via INTRAVENOUS
  Filled 2021-12-15 (×4): qty 50

## 2021-12-15 MED ORDER — MISOPROSTOL 50MCG HALF TABLET: 50.0000 ug | ORAL_TABLET | Freq: Once | ORAL | Status: DC

## 2021-12-15 MED ORDER — SOD CITRATE-CITRIC ACID 500-334 MG/5ML PO SOLN
30.0000 mL | ORAL | Status: DC | PRN
  Filled 2021-12-15: qty 30

## 2021-12-15 MED ORDER — LABETALOL HCL 5 MG/ML IV SOLN: 20.0000 mg | INTRAVENOUS | Status: DC | PRN

## 2021-12-15 MED ORDER — MISOPROSTOL 50MCG HALF TABLET
50.0000 ug | ORAL_TABLET | Freq: Once | ORAL | Status: AC
  Administered 2021-12-15: 50 ug via BUCCAL
  Filled 2021-12-15: qty 1

## 2021-12-15 MED ORDER — LACTATED RINGERS IV SOLN: 500.0000 mL | INTRAVENOUS | Status: DC | PRN

## 2021-12-15 MED ORDER — HYDRALAZINE HCL 20 MG/ML IJ SOLN: 10.0000 mg | INTRAMUSCULAR | Status: DC | PRN

## 2021-12-15 MED ORDER — LIDOCAINE HCL (PF) 1 % IJ SOLN: 30.0000 mL | INTRAMUSCULAR | Status: DC | PRN

## 2021-12-15 MED ORDER — OXYTOCIN-SODIUM CHLORIDE 30-0.9 UT/500ML-% IV SOLN
1.0000 m[IU]/min | INTRAVENOUS | Status: DC
  Administered 2021-12-15: 2 m[IU]/min via INTRAVENOUS

## 2021-12-15 NOTE — Progress Notes (Cosign Needed Addendum)
   PRENATAL VISIT NOTE  Subjective:  Samantha Hood is a 26 y.o. [id number] at [redacted]w[redacted]d being seen today for ongoing prenatal care.  She is currently monitored for the following issues for this high-risk pregnancy and has History of VBAC; Supervision of high risk pregnancy, antepartum; Previous cesarean section complicating pregnancy, antepartum condition or complication; Group B Streptococcus carrier, +RV culture, currently pregnant; Rh negative state in antepartum period; Pap smear abnormality of cervix with LGSIL, +HPV; History of gestational hypertension; Short interval between pregnancies affecting pregnancy, antepartum; Insufficient prenatal care; Grand multipara; Polyhydramnios affecting pregnancy in third trimester; and Gestational hypertension, third trimester on their problem list.  Patient reports.  Contractions: Irritability. Vag. Bleeding: None.  Movement: Present. Denies leaking of fluid.   The following portions of the patient's history were reviewed and updated as appropriate: allergies, current medications, past family history, past medical history, past social history, past surgical history and problem list.   Objective:   Vitals:   12/15/21 1048 12/15/21 1102  BP: (!) 146/79 (!) 146/93  Pulse: 79 89  Weight: 185 lb 1.6 oz (84 kg)     Fetal Status: Fetal Heart Rate (bpm): 130   Movement: Present     General:  Alert, oriented and cooperative. Patient is in no acute distress.  Skin: Skin is warm and dry. No rash noted.   Cardiovascular: Normal heart rate noted  Respiratory: Normal respiratory effort, no problems with respiration noted  Abdomen: Soft, gravid, appropriate for gestational age.  Pain/Pressure: Present     Pelvic: Cervical exam performed in the presence of a chaperone      1.5/50/-2  Extremities: Normal range of motion.  Edema: None  Mental Status: Normal mood and affect. Normal behavior. Normal judgment and thought content.    Assessment and Plan:  Pregnancy:  [id number] at [redacted]w[redacted]d 1. Supervision of high risk pregnancy, antepartum Limited prenatal care.  Elevated blood pressures today.  Instructed to go to the MAU for evaluation and probable admission for induction of labor.  2. Previous cesarean section complicating pregnancy, antepartum condition or complication Patient has had 3 VBACs since cesarean section.  Previous C-section due to nonreassuring fetal heart tones.  3. Rh negative state in antepartum period Patient will need RhoGAM evaluation after delivery  4. Insufficient prenatal care in third trimester  5. History of VBAC Patient would like to VBAC with this delivery.  Ultrasound evaluation today showing fetus cephalic presentation  6. Elevated Blood pressure affecting pregnancy, third trimester Blood pressures today 146/79.  Repeat 146/93.  Denies any headache or blurry vision.  Denies any abdominal pain.  Sending to the MAU for evaluation with possible induction for gHTN.  The MAU provider is aware.  7. GBS Positive  Abx during labor   Term labor symptoms and general obstetric precautions including but not limited to vaginal bleeding, contractions, leaking of fluid and fetal movement were reviewed in detail with the patient. Please refer to After Visit Summary for other counseling recommendations.   No follow-ups on file.  No future appointments.  Celedonio Savage, MD

## 2021-12-15 NOTE — Progress Notes (Signed)
Labor Progress Note Samantha Hood is a 26 y.o. [id number] at [redacted]w[redacted]d presented for IOL 2/2 gHTN.   S: Having some lower abdominal pain with contractions.   O:  BP 137/85   Pulse 90   Temp 98.1 F (36.7 C) (Oral)   Resp 18   Ht 5\' 2"  (1.575 m)   Wt 84.1 kg   BMI 33.93 kg/m  EFM: 145bpm/moderate/+accels, 2 prolonged decels.   CVE: Dilation: 2 Effacement (%): Thick Cervical Position: Middle Station: -2 Presentation: Vertex Exam by:: Polos RN  A&P: 26 y.o. 30 [redacted]w[redacted]d here for IOL 2/2 gHTN.  #Labor: s/p cytotec x1. Discontinue cytotec. This was discussed with patient and her partner at bedside. They voiced understanding and all questions were answered. Consider FB v. Pitocin @630PM .  #Pain: Maternally supported  #FWB: Cat II, prolonged decels. Terbutaline x1.  #GBS positive  gHTN -Continue to monitor BP  Hx of VBAC x3 -Continue to monitor for signs and symptoms of uterine rupture. -Avoid cytotec -Consider low dose pitocin   [name], DO 5:27 PM

## 2021-12-15 NOTE — Progress Notes (Addendum)
Labor Progress Note Samantha Hood is a 26 y.o. [id number] at [redacted]w[redacted]d presented for  IOL for gHTN.  S: Patient is resting comfortably. Denies feeling contractions.   O:  BP 126/62   Pulse (!) 119   Temp 98 F (36.7 C) (Oral)   Resp 18   Ht 5\' 2"  (1.575 m)   Wt 84.1 kg   SpO2 100%   BMI 33.93 kg/m  EFM: 155/+ accelerations /variable late decelerations   CVE: Dilation: 2 Effacement (%): Thick Cervical Position: Middle Station: -2 Presentation: Vertex Exam by:: Polos RN   A&P: 26 y.o. 30 [redacted]w[redacted]d IOL gHTN.  #Labor: Progressing well. Planning FB.  #Pain: planning epidural   #FWB: Cat 2, responding to position changes and terb. Known variable decelerations with prior pregnancies, no previous GYN U/S preformed.   #GBS positive   [redacted]w[redacted]d, DO Center for [place] [facility], [facility] 7:02 PM  [facility], DO OB Fellow, Faculty Practice [facility], Center for [facility] 12/15/2021, 8:01 PM

## 2021-12-15 NOTE — Progress Notes (Signed)
Labor Progress Note Samantha Hood is a 26 y.o. [id number] at [redacted]w[redacted]d presented for IOL for gHTN. She is undergoing a TOLAC, after having 3 successful VBACs. S: She is doing well. Feels contractions mildly.  O:  BP (!) 119/101   Pulse 98   Temp 99.2 F (37.3 C) (Oral)   Resp 16   Ht 5\' 2"  (1.575 m)   Wt 84.1 kg   SpO2 100%   BMI 33.93 kg/m   Vitals:   12/15/21 1318 12/15/21 1700 12/15/21 1730 12/15/21 1800  BP: 137/85 122/66 119/70 129/64   12/15/21 1900 12/15/21 2002 12/15/21 2030 12/15/21 2130  BP: 126/62 134/77 (!) 146/77 (!) 136/97   12/15/21 2200 12/15/21 2230  BP: 128/87 (!) 119/101     EFM: 135 bpm/moderate/+accels, no decels  CVE: Dilation: 4 Effacement (%): 70 Cervical Position: Middle Station: -1 Presentation: Vertex Exam by:: Ndule MD   A&P: 26 y.o. 30 [redacted]w[redacted]d being induced for gHTN.  #Labor: Progressing well. Still in latent phase, attempted AROM, but unsuccessful. Will plan to re-attempt in a couple hours. - start pitocin for augmentation. #Pain: comfortable at this time. Fentanyl, vs epidural per patient's desire #FWB: Category 1 #GBS positive, adequately treated  gHTN: BPs trending up in the last few checks. Normal platelets and CMP today. Will add on a urine P/C ratio and hypertension orders.  Aiden Helzer [redacted]w[redacted]d, MD 10:44 PM

## 2021-12-15 NOTE — H&P (Addendum)
OBSTETRIC ADMISSION HISTORY AND PHYSICAL  Samantha Hood is a 26 y.o. female [phone] with IUP at [redacted]w[redacted]d by U/S presenting for IOL for gHTN. She reports +FMs, No LOF, no VB, no blurry vision, headaches or peripheral edema, and RUQ pain.  She plans on breast and bottle feeding. She requests either LARC of BTL for birth control. She received her prenatal care at  [facility].    Dating: By U/S --->  Estimated Date of Delivery: 12/15/21  Sono:    @[redacted]w[redacted]d , CWD, normal anatomy, vertex presentation,  2764g, 32% EFW  Prenatal History/Complications:  - Hx VBAC  - GBS + - Rh negative - Hx gHTN - insufficient prenatal care  - polyhydramnios   Past Medical History: Past Medical History:  Diagnosis Date   Anemia    Chlamydia vaginitis/cervicitis 01/13/2015   Medical history non-contributory    Patient denies medical problems     Past Surgical History: Past Surgical History:  Procedure Laterality Date   CESAREAN SECTION     ORIF MANDIBULAR FRACTURE N/A 04/15/2018   Procedure: OPEN REDUCTION INTERNAL FIXATION (ORIF) MANDIBULAR FRACTURE;  Surgeon: 14/02/2018, DDS;  Location: [facility] OR;  Service: Oral Surgery;  Laterality: N/A;    Obstetrical History: OB History     Gravida  6   Para  5   Term  5   Preterm  0   AB  1   Living  5      SAB  0   IAB  0   Ectopic  0   Multiple  0   Live Births  5           Social History Social History   Socioeconomic History   Marital status: Single    Spouse name: Not on file   Number of children: Not on file   Years of education: Not on file   Highest education level: Not on file  Occupational History    Comment: out of work  Tobacco Use   Smoking status: Never   Smokeless tobacco: Never  Vaping Use   Vaping Use: Never used  Substance and Sexual Activity   Alcohol use: Not Currently    Comment: socially    Drug use: Never   Sexual activity: Not Currently    Birth control/protection: None  Other Topics Concern   Not on  file  Social History Narrative   ** Merged History Encounter **       Social Determinants of Health   Financial Resource Strain: Not on file  Food Insecurity: Food Insecurity Present (12/07/2021)   Hunger Vital Sign    Worried About Running Out of Food in the Last Year: Sometimes true    Ran Out of Food in the Last Year: Never true  Transportation Needs: No Transportation Needs (12/07/2021)   PRAPARE - 02/06/2022 (Medical): No    Lack of Transportation (Non-Medical): No  Physical Activity: Not on file  Stress: Not on file  Social Connections: Not on file    Family History: Family History  Problem Relation Age of Onset   Hypertension Mother    Hypertension Maternal Grandmother    Asthma Neg Hx    Cancer Neg Hx    Diabetes Neg Hx    Kidney disease Neg Hx    Obesity Neg Hx     Allergies: No Known Allergies  Pt denies allergies to latex, iodine, or shellfish.  Medications Prior to Admission  Medication Sig Dispense Refill Last  Dose   Prenatal Vit-Fe Fumarate-FA (PREPLUS) 27-1 MG TABS Take 1 tablet by mouth daily. 30 tablet 6      Review of Systems   All systems reviewed and negative except as stated in HPI  not currently breastfeeding. General appearance: alert and cooperative Lungs: normal effort Heart: regular rate noted Abdomen: gravid Extremities: Atraumatic Presentation: cephalic Fetal monitoringBaseline: 135 bpm, Variability: Good {> 6 bpm), Accelerations: Reactive, and Decelerations: Absent Uterine activityNone    Prenatal labs: ABO, Rh: B/Negative/-- (07/06 1201) Antibody: Negative (07/06 1201) Rubella: 1.55 (07/06 1201) RPR: Non Reactive (07/06 1201)  HBsAg: Negative (07/06 1201)  HIV: Non Reactive (07/06 1201)  GBS: Positive/-- (08/03 1130)  2 hr Glucola: normal  Genetic screening:  low risk  Anatomy US: normal   Prenatal Transfer Tool  Maternal Diabetes: No Genetic Screening: Normal Maternal  Ultrasounds/Referrals: Normal Fetal Ultrasounds or other Referrals:  None Maternal Substance Abuse:  No Significant Maternal Medications:  None Significant Maternal Lab Results: Group B Strep positive  No results found for this or any previous visit (from the past 24 hour(s)).  Patient Active Problem List   Diagnosis Date Noted   Gestational hypertension, third trimester 12/15/2021   Grand multipara 11/09/2021   Polyhydramnios affecting pregnancy in third trimester 11/09/2021   History of gestational hypertension 10/14/2020   Short interval between pregnancies affecting pregnancy, antepartum 10/14/2020   Insufficient prenatal care 10/14/2020   Pap smear abnormality of cervix with LGSIL, +HPV 06/25/2020   Rh negative state in antepartum period 06/22/2020   Group B Streptococcus carrier, +RV culture, currently pregnant 05/28/2019   Previous cesarean section complicating pregnancy, antepartum condition or complication 02/18/2019   Supervision of high risk pregnancy, antepartum 01/30/2019   History of VBAC 07/16/2016    Assessment/Plan:  Samantha Hood is a 26 y.o. [id number] at [redacted]w[redacted]d here for IOL for gHTN.  gHTN Most recent BP 137/85. -Continue to monitor in labor  Rh, neg -give rhogam if appropriate  #Labor: IOL, consider FB/Pitocin/AROM when and if appropriate #Pain: Planning epidural  #FWB: Cat 1 #ID: GBS + #MOF: both #MOC: BTL  #Circ:  yes  Lavonda Jumbo, DO OB Fellow, Faculty Practice [facility], Center for [facility] 12/15/2021, 3:44 PM

## 2021-12-16 ENCOUNTER — Encounter (HOSPITAL_COMMUNITY): Payer: Self-pay | Admitting: Family Medicine

## 2021-12-16 ENCOUNTER — Encounter (HOSPITAL_COMMUNITY): Payer: Self-pay | Admitting: Anesthesiology

## 2021-12-16 DIAGNOSIS — O134 Gestational [pregnancy-induced] hypertension without significant proteinuria, complicating childbirth: Secondary | ICD-10-CM

## 2021-12-16 DIAGNOSIS — O34211 Maternal care for low transverse scar from previous cesarean delivery: Secondary | ICD-10-CM

## 2021-12-16 DIAGNOSIS — Z3A40 40 weeks gestation of pregnancy: Secondary | ICD-10-CM

## 2021-12-16 DIAGNOSIS — O480 Post-term pregnancy: Secondary | ICD-10-CM

## 2021-12-16 DIAGNOSIS — O34219 Maternal care for unspecified type scar from previous cesarean delivery: Secondary | ICD-10-CM | POA: Diagnosis not present

## 2021-12-16 DIAGNOSIS — O403XX0 Polyhydramnios, third trimester, not applicable or unspecified: Secondary | ICD-10-CM

## 2021-12-16 DIAGNOSIS — O99820 Streptococcus B carrier state complicating pregnancy: Secondary | ICD-10-CM

## 2021-12-16 LAB — PROTEIN / CREATININE RATIO, URINE
Creatinine, Urine: 68 mg/dL
Total Protein, Urine: <6 mg/dL

## 2021-12-16 LAB — RPR: RPR Ser Ql: NONREACTIVE

## 2021-12-16 MED ORDER — ONDANSETRON HCL 4 MG PO TABS: 4.0000 mg | ORAL_TABLET | ORAL | Status: DC | PRN

## 2021-12-16 MED ORDER — SODIUM CHLORIDE 0.9% FLUSH
3.0000 mL | Freq: Two times a day (BID) | INTRAVENOUS | Status: DC
Start: 2021-12-16 — End: 2021-12-17

## 2021-12-16 MED ORDER — EPHEDRINE 5 MG/ML INJ: 10.0000 mg | INTRAVENOUS | Status: DC | PRN

## 2021-12-16 MED ORDER — WITCH HAZEL-GLYCERIN EX PADS: 1.0000 | MEDICATED_PAD | CUTANEOUS | Status: DC | PRN

## 2021-12-16 MED ORDER — TETANUS-DIPHTH-ACELL PERTUSSIS 5-2.5-18.5 LF-MCG/0.5 IM SUSY: 0.5000 mL | PREFILLED_SYRINGE | Freq: Once | INTRAMUSCULAR | Status: DC

## 2021-12-16 MED ORDER — COCONUT OIL OIL: 1.0000 | TOPICAL_OIL | Status: DC | PRN

## 2021-12-16 MED ORDER — SENNOSIDES-DOCUSATE SODIUM 8.6-50 MG PO TABS
2.0000 | ORAL_TABLET | ORAL | Status: DC
  Administered 2021-12-17: 2 via ORAL
  Filled 2021-12-16 (×2): qty 2

## 2021-12-16 MED ORDER — BENZOCAINE-MENTHOL 20-0.5 % EX AERO: 1.0000 | INHALATION_SPRAY | CUTANEOUS | Status: DC | PRN

## 2021-12-16 MED ORDER — PHENYLEPHRINE 80 MCG/ML (10ML) SYRINGE FOR IV PUSH (FOR BLOOD PRESSURE SUPPORT): 80.0000 ug | PREFILLED_SYRINGE | INTRAVENOUS | Status: DC | PRN

## 2021-12-16 MED ORDER — FUROSEMIDE 20 MG PO TABS
20.0000 mg | ORAL_TABLET | Freq: Every day | ORAL | Status: DC
  Administered 2021-12-16 – 2021-12-17 (×2): 20 mg via ORAL
  Filled 2021-12-16 (×2): qty 1

## 2021-12-16 MED ORDER — FENTANYL-BUPIVACAINE-NACL 0.5-0.125-0.9 MG/250ML-% EP SOLN
12.0000 mL/h | EPIDURAL | Status: DC | PRN
  Filled 2021-12-16: qty 250

## 2021-12-16 MED ORDER — LACTATED RINGERS IV SOLN: 500.0000 mL | Freq: Once | INTRAVENOUS | Status: DC

## 2021-12-16 MED ORDER — PRENATAL MULTIVITAMIN CH
1.0000 | ORAL_TABLET | Freq: Every day | ORAL | Status: DC
  Administered 2021-12-16 – 2021-12-17 (×2): 1 via ORAL
  Filled 2021-12-16 (×2): qty 1

## 2021-12-16 MED ORDER — IBUPROFEN 600 MG PO TABS
600.0000 mg | ORAL_TABLET | Freq: Four times a day (QID) | ORAL | Status: DC
  Administered 2021-12-16 – 2021-12-17 (×6): 600 mg via ORAL
  Filled 2021-12-16 (×7): qty 1

## 2021-12-16 MED ORDER — ONDANSETRON HCL 4 MG/2ML IJ SOLN: 4.0000 mg | INTRAMUSCULAR | Status: DC | PRN

## 2021-12-16 MED ORDER — RHO D IMMUNE GLOBULIN 1500 UNIT/2ML IJ SOSY
300.0000 ug | PREFILLED_SYRINGE | Freq: Once | INTRAMUSCULAR | Status: AC
  Administered 2021-12-16: 300 ug via INTRAVENOUS
  Filled 2021-12-16: qty 2

## 2021-12-16 MED ORDER — DIBUCAINE (PERIANAL) 1 % EX OINT: 1.0000 | TOPICAL_OINTMENT | CUTANEOUS | Status: DC | PRN

## 2021-12-16 MED ORDER — MEASLES, MUMPS & RUBELLA VAC IJ SOLR: 0.5000 mL | Freq: Once | INTRAMUSCULAR | Status: DC

## 2021-12-16 MED ORDER — OXYCODONE HCL 5 MG PO TABS: 10.0000 mg | ORAL_TABLET | ORAL | Status: DC | PRN

## 2021-12-16 MED ORDER — DIPHENHYDRAMINE HCL 50 MG/ML IJ SOLN: 12.5000 mg | INTRAMUSCULAR | Status: DC | PRN

## 2021-12-16 MED ORDER — ACETAMINOPHEN 325 MG PO TABS
650.0000 mg | ORAL_TABLET | ORAL | Status: DC | PRN
  Administered 2021-12-16 (×2): 650 mg via ORAL
  Filled 2021-12-16 (×2): qty 2

## 2021-12-16 MED ORDER — SODIUM CHLORIDE 0.9% FLUSH: 3.0000 mL | INTRAVENOUS | Status: DC | PRN

## 2021-12-16 MED ORDER — DIPHENHYDRAMINE HCL 25 MG PO CAPS: 25.0000 mg | ORAL_CAPSULE | Freq: Four times a day (QID) | ORAL | Status: DC | PRN

## 2021-12-16 MED ORDER — SIMETHICONE 80 MG PO CHEW: 80.0000 mg | CHEWABLE_TABLET | ORAL | Status: DC | PRN

## 2021-12-16 MED ORDER — OXYCODONE HCL 5 MG PO TABS: 5.0000 mg | ORAL_TABLET | ORAL | Status: DC | PRN

## 2021-12-16 MED ORDER — ZOLPIDEM TARTRATE 5 MG PO TABS: 5.0000 mg | ORAL_TABLET | Freq: Every evening | ORAL | Status: DC | PRN

## 2021-12-16 MED ORDER — SODIUM CHLORIDE 0.9 % IV SOLN: INTRAVENOUS | Status: DC | PRN

## 2021-12-16 MED ORDER — FENTANYL CITRATE (PF) 100 MCG/2ML IJ SOLN
100.0000 ug | INTRAMUSCULAR | Status: DC | PRN
  Administered 2021-12-16: 100 ug via INTRAVENOUS
  Filled 2021-12-16: qty 2

## 2021-12-16 NOTE — Anesthesia Preprocedure Evaluation (Deleted)
Anesthesia Evaluation    Airway        Dental   Pulmonary neg pulmonary ROS,           Cardiovascular hypertension,      Neuro/Psych negative neurological ROS  negative psych ROS   GI/Hepatic Neg liver ROS, GERD  ,  Endo/Other  Obesity  Renal/GU negative Renal ROS  negative genitourinary   Musculoskeletal negative musculoskeletal ROS (+)   Abdominal   Peds  Hematology  (+) Blood dyscrasia, anemia , Thrombocytopenia- probable gestational   Anesthesia Other Findings   Reproductive/Obstetrics (+) Pregnancy Hx/o previous C/Section with successful VBAC                           Anesthesia Physical Anesthesia Plan  ASA: 2  Anesthesia Plan: Epidural   Post-op Pain Management:    Induction:   PONV Risk Score and Plan:   Airway Management Planned: Natural Airway  Additional Equipment: None  Intra-op Plan:   Post-operative Plan:   Informed Consent:   Plan Discussed with:   Anesthesia Plan Comments:         Anesthesia Quick Evaluation

## 2021-12-16 NOTE — Anesthesia Preprocedure Evaluation (Signed)
Anesthesia Evaluation    Airway        Dental   Pulmonary neg pulmonary ROS,           Cardiovascular hypertension,      Neuro/Psych negative neurological ROS  negative psych ROS   GI/Hepatic Neg liver ROS, GERD  ,  Endo/Other  Obesity  Renal/GU negative Renal ROS  negative genitourinary   Musculoskeletal negative musculoskeletal ROS (+)   Abdominal   Peds  Hematology  (+) Blood dyscrasia, anemia , Thrombocytopenia- probable gestational   Anesthesia Other Findings   Reproductive/Obstetrics Hx/o previous C/Section with successful VBAC Desires sterilization                             Anesthesia Physical  Anesthesia Plan  ASA: 2  Anesthesia Plan: Spinal   Post-op Pain Management:    Induction:   PONV Risk Score and Plan:   Airway Management Planned: Natural Airway  Additional Equipment: None  Intra-op Plan:   Post-operative Plan:   Informed Consent:   Plan Discussed with:   Anesthesia Plan Comments: (Needs repeat platelet count prior to procedure)        Anesthesia Quick Evaluation

## 2021-12-16 NOTE — Lactation Note (Signed)
This note was copied from a baby's chart. Lactation Consultation Note  Patient Name: Boy Callie Facey VPCHE'K Date: 12/16/2021   Age:26 hours   LC Note:  Birth parent was marked a "Yes" for consult in labor and delivery.  Arrived for a consult, however, she was not interested in seeing a Advertising copywriter.  RN will follow up on the M/B unit.   Maternal Data    Feeding    LATCH Score                    Lactation Tools Discussed/Used    Interventions    Discharge    Consult Status      Tu Shimmel R Yolinda Duerr 12/16/2021, 3:38 AM

## 2021-12-16 NOTE — Discharge Summary (Signed)
Postpartum Discharge Summary  Date of Service updated***     Patient Name: Samantha Hood DOB: 1995/08/17 MRN: [id number]  Date of admission: 12/15/2021 Delivery date:12/16/2021  Delivering provider: Stormy Card  Date of discharge: 12/16/2021  Admitting diagnosis: Encounter for induction of labor [Z34.90] Intrauterine pregnancy: [redacted]w[redacted]d    Secondary diagnosis:  Principal Problem:   VBAC, delivered, current hospitalization Active Problems:   History of VBAC   Supervision of high risk pregnancy, antepartum   Previous cesarean section complicating pregnancy, antepartum condition or complication   Group B Streptococcus carrier, +RV culture, currently pregnant   Rh negative state in antepartum period   Encounter for induction of labor   SVD (spontaneous vaginal delivery)  Additional problems: ***    Discharge diagnosis: Term Pregnancy Delivered, VBAC, and Gestational Hypertension                                              Post partum procedures:{Postpartum procedures:23558} Augmentation: Pitocin and Cytotec Complications: None  Hospital course: Induction of Labor With Vaginal Delivery   26y.o. yo [id number] admitted to the hospital 12/15/2021 for induction of labor.  Indication for induction: Gestational hypertension.  Patient had an uncomplicated labor course as follows: Membrane Rupture Time/Date: 2:38 AM ,12/16/2021   Delivery Method:Vaginal, Spontaneous  Episiotomy: None  Lacerations:  None  Details of delivery can be found in separate delivery note.  Patient had a routine postpartum course. Patient is discharged home 12/16/21.  Newborn Data: Birth date:12/16/2021  Birth time:2:39 AM  Gender:Female  Living status:Living  Apgars:8 ,9  Weight:   Magnesium Sulfate received: No BMZ received: No Rhophylac:{Rhophylac received:30440032} MMR:N/A Rubella Immune T-DaP:{Tdap:23962} declined prenatally Flu: N/A Transfusion:{Transfusion  received:30440034}  Physical exam  Vitals:   12/16/21 0100 12/16/21 0130 12/16/21 0305 12/16/21 0322  BP: 137/85 136/79 129/79 135/77  Pulse: 79 74 84 72  Resp:   17   Temp:   97.8 F (36.6 C)   TempSrc:   Oral   SpO2:      Weight:      Height:       General: {Exam; general:21111117} Lochia: {Desc; appropriate/inappropriate:30686::"appropriate"} Uterine Fundus: {Desc; firm/soft:30687} Incision: {Exam; incision:21111123} DVT Evaluation: {Exam; dvt:2111122} Labs: Lab Results  Component Value Date   WBC 7.2 12/15/2021   HGB 11.8 (L) 12/15/2021   HCT 35.4 (L) 12/15/2021   MCV 81.9 12/15/2021   PLT 128 (L) 12/15/2021      Latest Ref Rng & Units 12/15/2021    1:42 PM  CMP  Glucose 70 - 99 mg/dL 78   BUN 6 - 20 mg/dL 6   Creatinine 0.44 - 1.00 mg/dL 0.62   Sodium 135 - 145 mmol/L 137   Potassium 3.5 - 5.1 mmol/L 4.2   Chloride 98 - 111 mmol/L 109   CO2 22 - 32 mmol/L 21   Calcium 8.9 - 10.3 mg/dL 8.8   Total Protein 6.5 - 8.1 g/dL 6.1   Total Bilirubin 0.3 - 1.2 mg/dL 0.3   Alkaline Phos 38 - 126 U/L 168   AST 15 - 41 U/L 21   ALT 0 - 44 U/L 14    Edinburgh Score:    06/22/2019   11:13 PM  Edinburgh Postnatal Depression Scale Screening Tool  I have been able to laugh and see the funny side of things. 0  I have looked forward with enjoyment to things. 0  I have blamed myself unnecessarily when things went wrong. 2  I have been anxious or worried for no good reason. 0  I have felt scared or panicky for no good reason. 0  Things have been getting on top of me. 0  I have been so unhappy that I have had difficulty sleeping. 0  I have felt sad or miserable. 0  I have been so unhappy that I have been crying. 0  The thought of harming myself has occurred to me. 0  Edinburgh Postnatal Depression Scale Total 2     After visit meds:  Allergies as of 12/16/2021   No Known Allergies   Med Rec must be completed prior to using this [facility]***        Discharge  home in stable condition Infant Feeding: Bottle and Breast Infant Disposition:home with mother Discharge instruction: per After Visit Summary and Postpartum booklet. Activity: Advance as tolerated. Pelvic rest for 6 weeks.  Diet: routine diet Future Appointments:No future appointments. Follow up Visit:  Message sent to [facility] clinic by Mikki Santee, MD Please schedule this patient for a In person postpartum visit in 6 weeks with the following provider: Any provider. Additional Postpartum F/U:BP check 1 week  High risk pregnancy complicated by: HTN and previous C-section Delivery mode:  Vaginal, Spontaneous  Anticipated Birth Control:   Pt needs consult 2 weeks postpartum for BTL preop appt. She signed papers 8/3 and was unable to have ppBTL. A message was sent to the office to arrange.     12/16/2021 Chiagoziem Sherrilyn Rist, MD

## 2021-12-17 ENCOUNTER — Encounter (HOSPITAL_COMMUNITY)
Admission: AD | Disposition: A | Discharge status: Home / Self Care | Payer: Self-pay | Source: Ambulatory Visit | Attending: Obstetrics & Gynecology

## 2021-12-17 LAB — RH IG WORKUP (INCLUDES ABO/RH)
Fetal Screen: NEGATIVE
Gestational Age(Wks): 40.1
Unit division: 0

## 2021-12-17 SURGERY — LIGATION, FALLOPIAN TUBE, POSTPARTUM
Anesthesia: Choice | Laterality: Bilateral

## 2021-12-17 MED ORDER — IBUPROFEN 600 MG PO TABS: 600.0000 mg | ORAL_TABLET | Freq: Four times a day (QID) | ORAL | 0 refills | Status: DC

## 2021-12-17 MED ORDER — WHITE PETROLATUM EX OINT: 1.0000 | TOPICAL_OINTMENT | CUTANEOUS | Status: DC | PRN

## 2021-12-17 MED ORDER — MEDROXYPROGESTERONE ACETATE 150 MG/ML IM SUSP
150.0000 mg | Freq: Once | INTRAMUSCULAR | Status: AC
  Administered 2021-12-17: 150 mg via INTRAMUSCULAR
  Filled 2021-12-17: qty 1

## 2021-12-17 MED ORDER — SUCROSE 24% NICU/PEDS ORAL SOLUTION: 0.5000 mL | OROMUCOSAL | Status: DC | PRN

## 2021-12-17 MED ORDER — EPINEPHRINE TOPICAL FOR CIRCUMCISION 0.1 MG/ML: 1.0000 [drp] | TOPICAL | Status: DC | PRN

## 2021-12-17 MED ORDER — FUROSEMIDE 20 MG PO TABS: 20.0000 mg | ORAL_TABLET | Freq: Every day | ORAL | 0 refills | Status: DC

## 2021-12-17 MED ORDER — LIDOCAINE 1% INJECTION FOR CIRCUMCISION: 0.8000 mL | INJECTION | Freq: Once | INTRAVENOUS | Status: DC

## 2021-12-17 MED ORDER — GELATIN ABSORBABLE 12-7 MM EX MISC: 1.0000 | Freq: Once | CUTANEOUS | Status: DC | PRN

## 2021-12-17 NOTE — Clinical Social Work Maternal (Addendum)
CLINICAL SOCIAL WORK MATERNAL/CHILD NOTE  Patient Details  Name: Samantha Hood MRN: [id number] Date of Birth: 1995/11/18  Date:  12/17/2021  Clinical Social Worker Initiating Note:  Idamae Lusher, MSW, [place], [place] Date/Time: Initiated:  12/17/21/0915     Child's Name:  Samantha Hood   Biological Parents:  Mother, Father   Need for Interpreter:  None   Reason for Referral:  Late or No Prenatal Care     Address:  [address] [place] [address]    Phone number:  [phone] (home)     Additional phone number:   Household Members/Support Persons (HM/SP):   Household Member/Support Person 1, Household Member/Support Person 2, Household Member/Support Person 3, Household Member/Support Person 4, Household Member/Support Person 5   HM/SP Name Relationship DOB or Age  HM/SP -1 Lacey Jensen 12/12/12  HM/SP -2 Jasmen Emrich Daughter 05/16/15  HM/SP -3 Ke'mari Bobbye [place] Son 11/28/16  HM/SP -4 Gerald Leitz Daughter 06/21/19  HM/SP -5 Ameire Crouch Daughter 10/15/20  HM/SP -6        HM/SP -7        HM/SP -8          Natural Supports (not living in the home):  Immediate Family, Spouse/significant other   Professional Supports: None   Employment: Unemployed   Type of Work:     Education:  Programmer, systems   Homebound arranged:    Museum/gallery curator Resources:  Medicaid   Other Resources:  Physicist, medical  , [place] Considerations Which May Impact Care:  None identified  Strengths:  Ability to meet basic needs  , Engineer, materials, Home prepared for child     Psychotropic Medications:         Pediatrician:    Solicitor area  Pediatrician List:   [facility] ([address])  [place]      Pediatrician Fax Number:    Risk Factors/Current Problems:  Compliance with Treatment     Cognitive State:  Able to  Concentrate  , Alert  , Goal Oriented  , Linear Thinking     Mood/Affect:  Calm  , Comfortable  , Interested  , Relaxed     CSW Assessment: CSW received a consult to meet with MOB due to limited prenatal care. CSW met with MOB at bedside. When CSW entered room, MOB was observed sitting in hospital bed. Infant "Rodney Cruise" was observed sleeping on his back in bassinet. FOB entered the room shortly after. MOB provided verbal consent to speak in front of FOB about anything. CSW introduced self and reason for consult. MOB presented as warm and remained engaged throughout consult.  CSW inquired about MOB's mental health history. MOB denied a history of mental health symptoms/diagnoses. MOB shared she did not experience PMADs during/after previous pregnancies. MOB denied attending therapy or taking psychotropic medication in the past. MOB shared she receives support through FOB and shared she has a big family. MOB denied current SI/HI. Domestic violence was not assessed due to FOB being present.   MOB shared she has all needed baby items, including a car seat and bassinet. MOB has chosen [facility] as infant's pediatrician. MOB reports she receives food stamps and WIC. CSW encouraged MOB to notify both agencies of infant's birth to add infant to benefits.  CSW informed MOB about hospital drug screen policy due to limited prenatal care. CSW explained that infant's UDS and CDS would be monitored and a CPS report would be made if warranted. MOB voiced understanding. MOB denied substance use during pregnancy. CSW informed MOB infant's UDS was negative. CSW inquired about any barriers to prenatal care during pregnancy. MOB shared her OBGYN clinic was "booked up" and she was told she needed to schedule an appointment with a different clinic. MOB shared that she tried to call and "everyone was booked up." CSW inquired if MOB anticipates any future barriers to attend follow up and/or  pediatrician appointments for infant. MOB denied future barriers, reporting she has transportation. CPS denied CPS history.   CSW provided education regarding the baby blues period vs. perinatal mood disorders, discussed treatment and gave resources for mental health follow up if concerns arise.  CSW recommends self-evaluation during the postpartum time period using the New Mom Checklist from Postpartum Progress and encouraged MOB to contact a medical professional if symptoms are noted at any time.    CSW provided review of Sudden Infant Death Syndrome (SIDS) precautions.    CSW will continue to monitor umbilical cord tissue drug screen results and make report if warranted. No barriers to discharge at this time.  CSW Plan/Description:  No Further Intervention Required/No Barriers to Discharge, CSW Will Continue to Monitor Umbilical Cord Tissue Drug Screen Results and Make Report if [facility], [place], Perinatal Mood and Anxiety Disorder (PMADs) Education, Sudden Infant Death Syndrome (SIDS) Education    Kenna Gilbert [place], [place] 12/17/2021, 9:20 AM

## 2021-12-18 ENCOUNTER — Encounter (HOSPITAL_COMMUNITY): Payer: Self-pay | Admitting: Family Medicine

## 2021-12-18 LAB — TYPE AND SCREEN
ABO/RH(D): B NEG
Antibody Screen: POSITIVE
Unit division: 0
Unit division: 0

## 2021-12-18 LAB — BPAM RBC
Blood Product Expiration Date: 202308272359
Blood Product Expiration Date: 202308272359
Unit Type and Rh: 1700
Unit Type and Rh: 1700

## 2021-12-25 ENCOUNTER — Ambulatory Visit: Payer: 59

## 2021-12-27 ENCOUNTER — Telehealth: Payer: Self-pay | Admitting: *Deleted

## 2021-12-27 ENCOUNTER — Ambulatory Visit: Payer: 59

## 2021-12-27 ENCOUNTER — Telehealth (HOSPITAL_COMMUNITY): Payer: Self-pay | Admitting: *Deleted

## 2021-12-27 NOTE — Telephone Encounter (Signed)
Mariadelosang DNKA bp check nurse visit . I called and notified her she missed appointment and offered to reschedule or check bp now and tell me over phone. She states she doesn't have bp cuff with her right now but agreed to appointment 8/ 29/23 at [facility]

## 2021-12-27 NOTE — Telephone Encounter (Signed)
Mom reports feeling good. No concerns about herself at this time. EPDS=3 [facility] score=5) Mom reports baby is doing well. Feeding, peeing, and pooping without difficulty. Safe sleep reviewed. Mom reports no concerns about baby at present.  Duffy Rhody, RN 12-27-2021 at 2:58pm

## 2022-01-02 ENCOUNTER — Telehealth: Payer: Medicaid Other

## 2022-01-02 ENCOUNTER — Encounter: Payer: Self-pay | Admitting: *Deleted

## 2022-01-02 ENCOUNTER — Telehealth: Payer: Self-pay | Admitting: *Deleted

## 2022-01-02 NOTE — Telephone Encounter (Signed)
[place] had bp check virtual visit scheduled and DNKA. I called to see if she can complete the visit. She states she cannot do visit and will call to reschedule later this week, having some issues with car. I called back after call ended and left a message telling her she can reschedule as virtual or in person, and she can call medicaid for medicaid transportation. I will also send mychart message. Nancy Fetter

## 2022-01-11 ENCOUNTER — Ambulatory Visit: Payer: 59 | Admitting: Obstetrics and Gynecology

## 2022-01-29 ENCOUNTER — Ambulatory Visit: Payer: 59 | Admitting: Obstetrics and Gynecology

## 2022-01-29 DIAGNOSIS — Z8759 Personal history of other complications of pregnancy, childbirth and the puerperium: Secondary | ICD-10-CM

## 2022-02-27 ENCOUNTER — Ambulatory Visit: Payer: 59 | Admitting: Obstetrics and Gynecology

## 2022-02-28 ENCOUNTER — Ambulatory Visit: Payer: 59 | Admitting: Family Medicine

## 2022-03-22 ENCOUNTER — Ambulatory Visit: Payer: 59 | Admitting: Obstetrics and Gynecology

## 2022-05-07 NOTE — L&D Delivery Note (Cosign Needed Addendum)
LABOR COURSE 27 y.o. [id number] [redacted]w[redacted]d who presented for TOLAC IOL due to gHTN . Progressed well with pitocin. SROM during epidural administration.   Delivery Note Called to room and patient was complete and pushing. Head delivered OA. No nuchal cord present. Shoulder and body delivered in usual fashion. At  1847 a viable female was delivered via Vaginal, Spontaneous (Presentation:OA;LOA ).  Infant with spontaneous cry, placed on mother's abdomen, dried and stimulated. Cord clamped x 2 after 1-minute delay, and cut by FOB. Cord blood drawn. Placenta delivered spontaneously with gentle cord traction. Appears intact. Fundus firm with massage and Pitocin. Labia, perineum, vagina, and cervix inspected with no lacerations noted.    APGAR: 9, 9; weight 3190g .   Cord: 3VC with no complications   Cord pH: TBD  Anesthesia: Epidural Episiotomy: None Lacerations: none  Mom to postpartum.  Baby to Couplet care / Skin to Skin.  Cashion, Cyndra Numbers, MD 8:28 PM     GME ATTESTATION:  Evaluation and management procedures were performed by the [facility] Medicine Resident under my supervision. I was immediately available for direct supervision, assistance and direction throughout this encounter.  I also confirm that I have verified the information documented in the resident's note, and that I have also personally reperformed the pertinent components of the physical exam and all of the medical decision making activities.  I have also made any necessary editorial changes.  Wyn Forster, MD OB Fellow, Faculty [facility], Center for [facility] 01/16/2023 10:04 PM

## 2022-07-21 IMAGING — US US MFM OB DETAIL+14 WK
1 series · 13 of 28 positions shown · non-contrast
Comparison: none

[Series 1: us mfm ob detail+14 wk · 115 acquisitions, 13 frames shown]
[im 5/115]
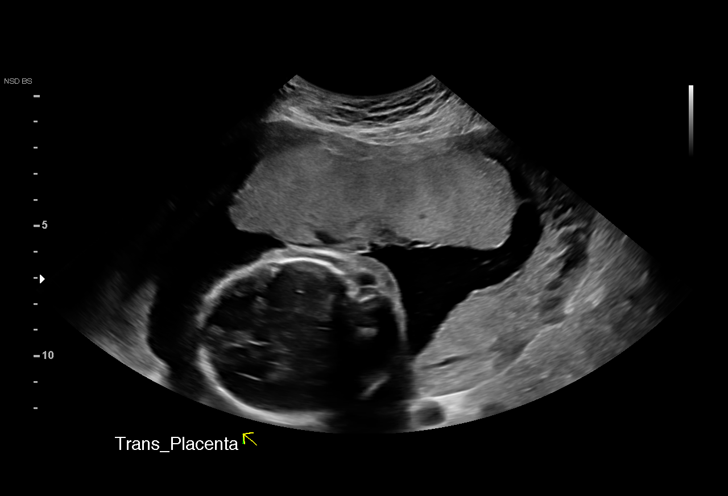
[im 13/115]
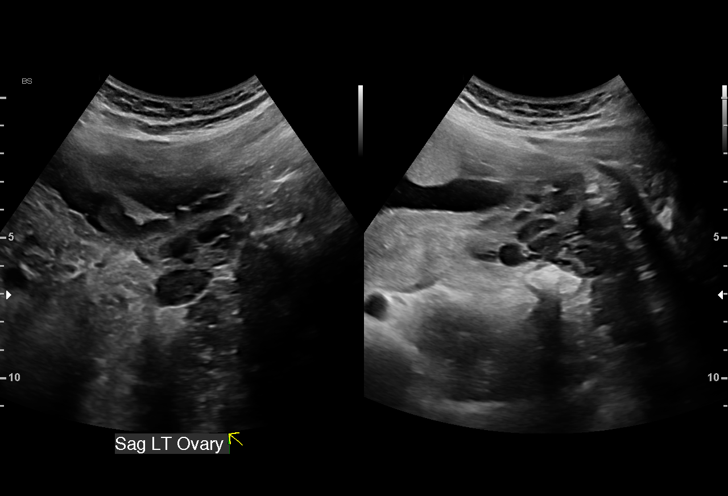
[im 22/115]
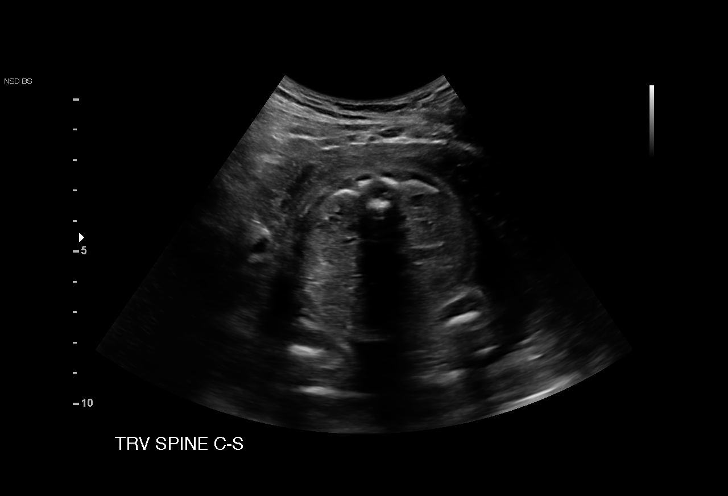
[im 30/115]
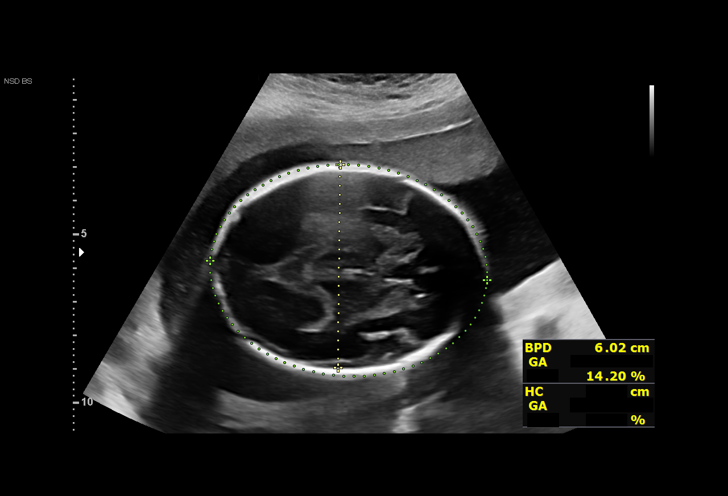
[im 39/115]
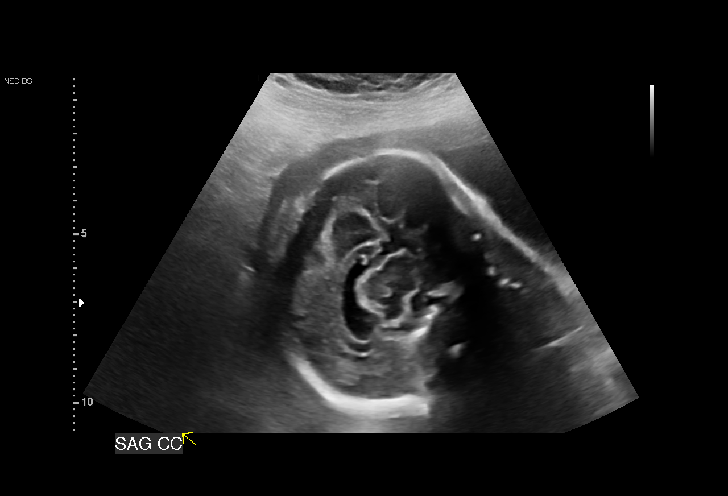
[im 47/115]
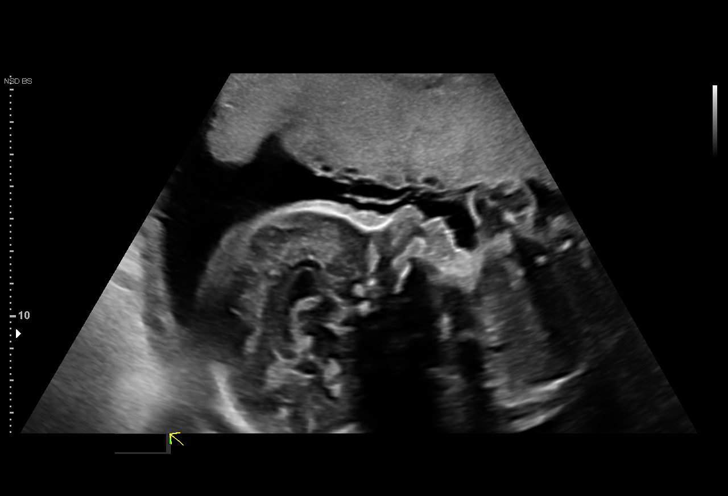
[im 60/115]
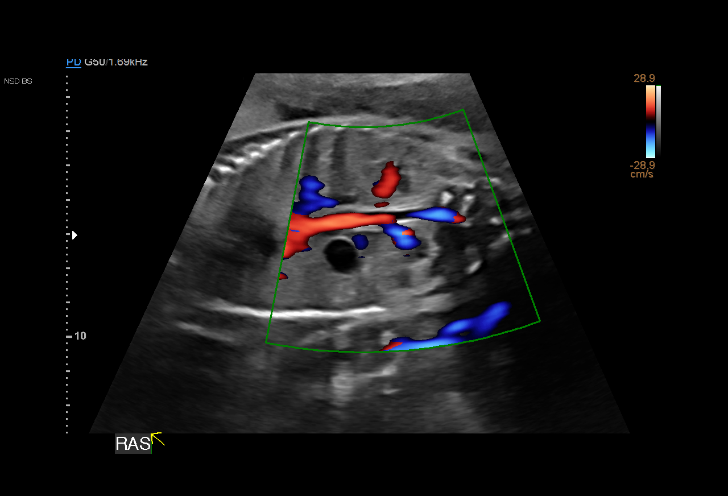
[im 68/115]
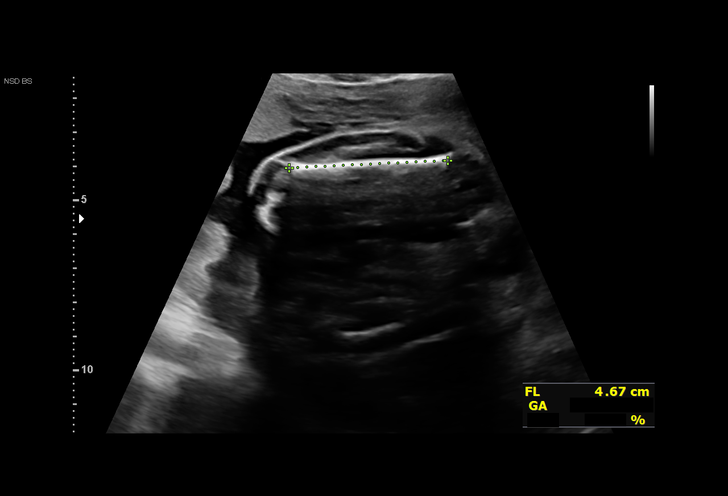
[im 77/115]
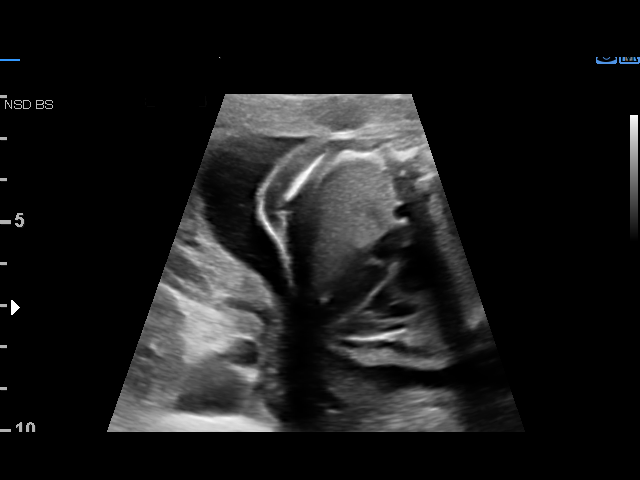
[im 85/115]
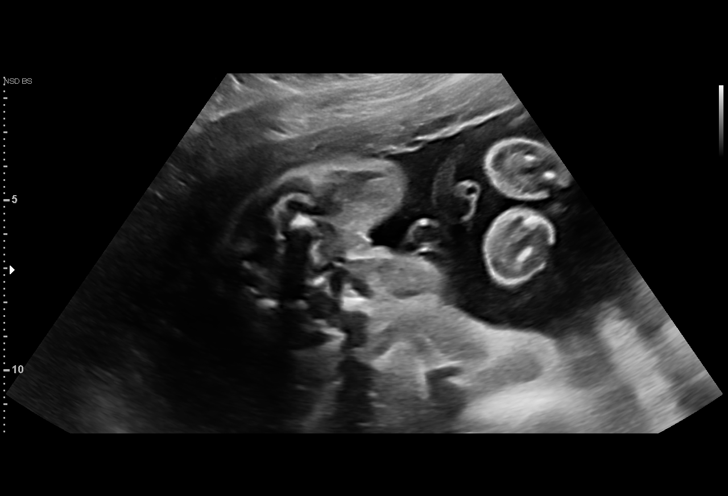
[im 93/115]
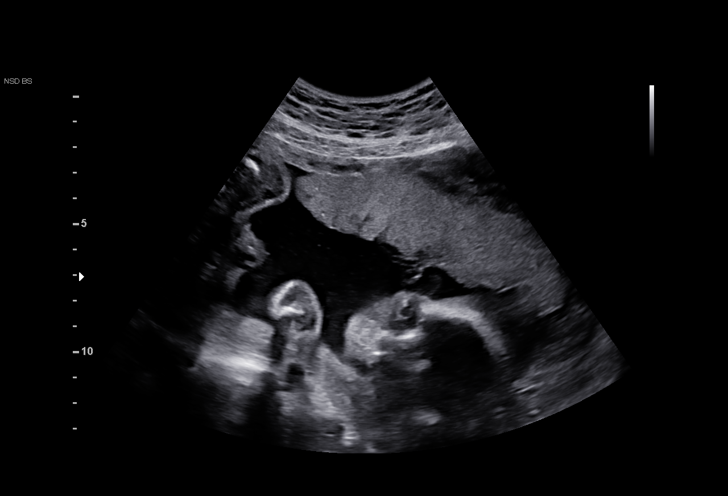
[im 102/115]
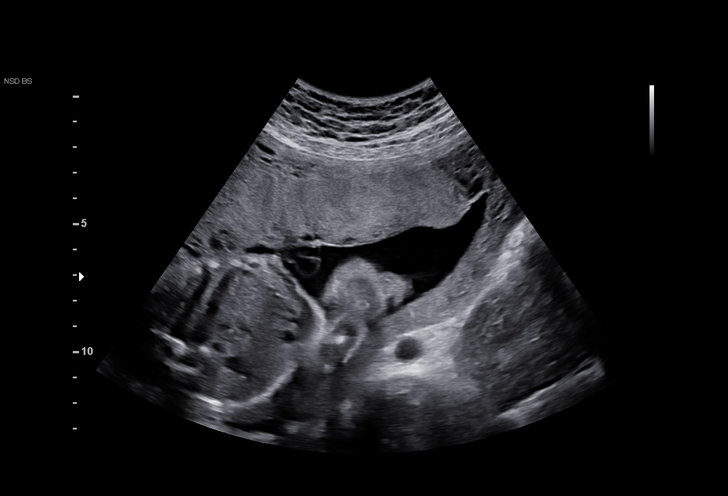
[im 110/115]
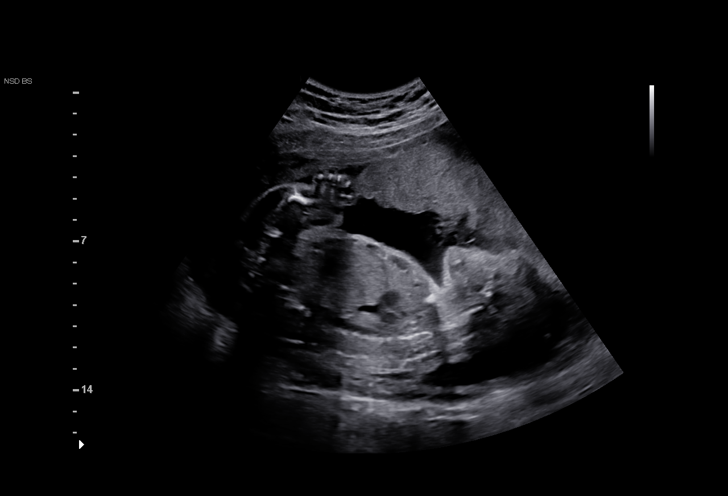

[13 of 28 positions shown; findings below may reference images not displayed]

Indications

 Previous cesarean delivery, antepartum (2
 subsequent VBAC)
 Encounter for antenatal screening for
 malformations
 25 weeks gestation of pregnancy
Fetal Evaluation

 Num Of Fetuses:         1
 Fetal Heart Rate(bpm):  157
 Cardiac Activity:       Observed
 Presentation:           Breech
 Placenta:               Anterior
 P. Cord Insertion:      Visualized

 Amniotic Fluid
 AFI FV:      Within normal limits

                             Largest Pocket(cm)

Biometry

 BPD:      60.3  mm     G. Age:  24w 4d         15  %    CI:        70.11   %    70 - 86
                                                         FL/HC:      20.4   %    18.7 -
 HC:      229.7  mm     G. Age:  25w 0d         15  %    HC/AC:      1.15        1.04 -
 AC:      199.8  mm     G. Age:  24w 4d         18  %    FL/BPD:     77.8   %    71 - 87
 FL:       46.9  mm     G. Age:  25w 4d         42  %    FL/AC:      23.5   %    20 - 24
 HUM:      43.8  mm     G. Age:  26w 0d         61  %
 CER:      29.3  mm     G. Age:  25w 5d         58  %
 LV:        4.9  mm
 CM:        5.2  mm

 Est. FW:     761  gm    1 lb 11 oz      23  %
OB History

 Gravidity:    6         Term:   4        Prem:   0        SAB:   0
 TOP:          0       Ectopic:  0        Living: 4
Gestational Age

 U/S Today:     25w 0d                                        EDD:   10/12/20
 Best:          25w 3d     Det. By:  Early Ultrasound         EDD:   10/09/20
                                     (03/24/20)
Anatomy

 Cranium:               Appears normal         LVOT:                   Not well visualized
 Cavum:                 Appears normal         Aortic Arch:            Appears normal
 Ventricles:            Appears normal         Ductal Arch:            Not well visualized
 Choroid Plexus:        Appears normal         Diaphragm:              Appears normal
 Cerebellum:            Appears normal         Stomach:                Appears normal, left
                                                                       sided
 Posterior Fossa:       Appears normal         Abdomen:                Appears normal
 Nuchal Fold:           Not applicable (>20    Abdominal Wall:         Appears nml (cord
                        wks GA)                                        insert, abd wall)
 Face:                  Appears normal         Cord Vessels:           Appears normal (3
                        (orbits and profile)                           vessel cord)
 Lips:                  Appears normal         Kidneys:                Appear normal
 Palate:                Not well visualized    Bladder:                Appears normal
 Thoracic:              Appears normal         Spine:                  Appears normal
 Heart:                 Appears normal         Upper Extremities:      Appears normal
                        (4CH, axis, and
                        situs)
 RVOT:                  Not well visualized    Lower Extremities:      Appears normal

 Other:  Fetus appears to be female. Lt 5th digit visualized. Rt heel visualized.
         Technically difficult due to fetal position.
Cervix Uterus Adnexa

 Cervix
 Length:           3.13  cm.
 Normal appearance by transabdominal scan.

 Right Ovary
 Within normal limits.

 Left Ovary
 Within normal limits.

 Adnexa
 No abnormality visualized.
Comments

 This patient was seen for a detailed fetal anatomy scan. The
 patient just initiated prenatal care last week.
 She denies any significant past medical history and denies
 any problems in her current pregnancy.
 It is uncertain if she had any screening tests for fetal
 aneuploidy drawn in her current pregnancy.
 She was informed that the fetal growth and amniotic fluid
 level were appropriate for her gestational age.
 There were no obvious fetal anomalies noted on today's
 ultrasound exam. However, the views of the fetal anatomy
 were limited today due to the fetal position.
 The patient was informed that anomalies may be missed due
 to technical limitations. If the fetus is in a suboptimal position
 or maternal habitus is increased, visualization of the fetus in
 the maternal uterus may be impaired.
 A follow-up exam was scheduled in 4 weeks to assess the
 fetal growth and to complete the views of the fetal anatomy.

## 2022-10-15 ENCOUNTER — Inpatient Hospital Stay (HOSPITAL_COMMUNITY)
Admission: AD | Admit: 2022-10-15 | Discharge: 2022-10-15 | Discharge status: Against Medical Advice | Payer: Medicaid Other | Attending: Obstetrics and Gynecology | Admitting: Obstetrics and Gynecology

## 2022-10-15 ENCOUNTER — Other Ambulatory Visit: Payer: Self-pay

## 2022-10-15 ENCOUNTER — Encounter (HOSPITAL_COMMUNITY): Payer: Self-pay | Admitting: *Deleted

## 2022-10-15 ENCOUNTER — Inpatient Hospital Stay (HOSPITAL_BASED_OUTPATIENT_CLINIC_OR_DEPARTMENT_OTHER): Payer: Medicaid Other

## 2022-10-15 DIAGNOSIS — W108XXA Fall (on) (from) other stairs and steps, initial encounter: Secondary | ICD-10-CM

## 2022-10-15 DIAGNOSIS — O26892 Other specified pregnancy related conditions, second trimester: Secondary | ICD-10-CM

## 2022-10-15 DIAGNOSIS — W109XXA Fall (on) (from) unspecified stairs and steps, initial encounter: Secondary | ICD-10-CM | POA: Diagnosis not present

## 2022-10-15 DIAGNOSIS — O09292 Supervision of pregnancy with other poor reproductive or obstetric history, second trimester: Secondary | ICD-10-CM | POA: Diagnosis present

## 2022-10-15 DIAGNOSIS — O9A212 Injury, poisoning and certain other consequences of external causes complicating pregnancy, second trimester: Secondary | ICD-10-CM

## 2022-10-15 DIAGNOSIS — Z3A26 26 weeks gestation of pregnancy: Secondary | ICD-10-CM

## 2022-10-15 DIAGNOSIS — O0932 Supervision of pregnancy with insufficient antenatal care, second trimester: Secondary | ICD-10-CM

## 2022-10-15 DIAGNOSIS — W19XXXA Unspecified fall, initial encounter: Secondary | ICD-10-CM | POA: Diagnosis not present

## 2022-10-15 LAB — COMPREHENSIVE METABOLIC PANEL
ALT: 10 U/L (ref 0–44)
AST: 18 U/L (ref 15–41)
Albumin: 2.7 g/dL — ABNORMAL LOW (ref 3.5–5.0)
Alkaline Phosphatase: 68 U/L (ref 38–126)
Anion gap: 6 (ref 5–15)
BUN: 6 mg/dL (ref 6–20)
CO2: 21 mmol/L — ABNORMAL LOW (ref 22–32)
Calcium: 8.7 mg/dL — ABNORMAL LOW (ref 8.9–10.3)
Chloride: 107 mmol/L (ref 98–111)
Creatinine, Ser: 0.56 mg/dL (ref 0.44–1.00)
GFR, Estimated: >60 mL/min (ref >60)
Glucose, Bld: 94 mg/dL (ref 70–99)
Potassium: 3.4 mmol/L — ABNORMAL LOW (ref 3.5–5.1)
Sodium: 134 mmol/L — ABNORMAL LOW (ref 135–145)
Total Bilirubin: 0.5 mg/dL (ref 0.3–1.2)
Total Protein: 5.8 g/dL — ABNORMAL LOW (ref 6.5–8.1)

## 2022-10-15 LAB — RAPID URINE DRUG SCREEN, HOSP PERFORMED
Amphetamines: NOT DETECTED
Barbiturates: NOT DETECTED
Benzodiazepines: NOT DETECTED
Cocaine: NOT DETECTED
Opiates: NOT DETECTED
Tetrahydrocannabinol: NOT DETECTED

## 2022-10-15 LAB — URINALYSIS, ROUTINE W REFLEX MICROSCOPIC
Bilirubin Urine: NEGATIVE
Glucose, UA: NEGATIVE mg/dL
Ketones, ur: NEGATIVE mg/dL
Nitrite: NEGATIVE
Protein, ur: NEGATIVE mg/dL
Specific Gravity, Urine: 1.012 (ref 1.005–1.030)
pH: 7 (ref 5.0–8.0)

## 2022-10-15 LAB — CBC
HCT: 32.7 % — ABNORMAL LOW (ref 36.0–46.0)
Hemoglobin: 11 g/dL — ABNORMAL LOW (ref 12.0–15.0)
MCH: 28.9 pg (ref 26.0–34.0)
MCHC: 33.6 g/dL (ref 30.0–36.0)
MCV: 85.8 fL (ref 80.0–100.0)
Platelets: 171 10*3/uL (ref 150–400)
RBC: 3.81 MIL/uL — ABNORMAL LOW (ref 3.87–5.11)
RDW: 13.2 % (ref 11.5–15.5)
WBC: 7.6 10*3/uL (ref 4.0–10.5)
nRBC: 0 % (ref 0.0–0.2)

## 2022-10-15 LAB — DIFFERENTIAL
Abs Immature Granulocytes: 0.03 10*3/uL (ref 0.00–0.07)
Basophils Absolute: 0 10*3/uL (ref 0.0–0.1)
Basophils Relative: 0 %
Eosinophils Absolute: 0.2 10*3/uL (ref 0.0–0.5)
Eosinophils Relative: 2 %
Immature Granulocytes: 0 %
Lymphocytes Relative: 27 %
Lymphs Abs: 2 10*3/uL (ref 0.7–4.0)
Monocytes Absolute: 0.6 10*3/uL (ref 0.1–1.0)
Monocytes Relative: 7 %
Neutro Abs: 4.8 10*3/uL (ref 1.7–7.7)
Neutrophils Relative %: 64 %

## 2022-10-15 LAB — WET PREP, GENITAL
Clue Cells Wet Prep HPF POC: NONE SEEN
Sperm: NONE SEEN
Trich, Wet Prep: NONE SEEN
WBC, Wet Prep HPF POC: >=10 — AB (ref <10)
Yeast Wet Prep HPF POC: NONE SEEN

## 2022-10-15 LAB — ABO/RH
ABO/RH(D): B NEG
Antibody Screen: NEGATIVE

## 2022-10-15 LAB — HEPATITIS B SURFACE ANTIGEN: Hepatitis B Surface Ag: NONREACTIVE

## 2022-10-15 LAB — HIV ANTIBODY (ROUTINE TESTING W REFLEX): HIV Screen 4th Generation wRfx: NONREACTIVE

## 2022-10-15 NOTE — MAU Note (Signed)
Samantha Hood is a 27 y.o. at Unknown here in MAU reporting: she fell down the steps approximately 1 hour ago.  Reports landed on her left side, doesn't abdomen was struck.  Denies VB, unsure if ROM.  States was wet upon getting up from fall, unsure if urinated or water broke.  Endorses +FM since fall. No PNC with pregnancy, unsure of LMP, states approximately 5-6 months.  Planned to abort pregnancy but too far along. LMP: Unsure Onset of complaint: today Pain score: 9 Vitals:   10/15/22 1839  BP: 123/62  Pulse: 85  Resp: 19  Temp: 97.9 F (36.6 C)  SpO2: 100%     FHT:143 bpm Lab orders placed from triage:  UA

## 2022-10-15 NOTE — MAU Provider Note (Addendum)
MAU Provider Note  History  [id number]  Arrival date and time: 10/15/22 1739   Chief Complaint  Patient presents with   Fall     HPI Samantha Hood is a 27 y.o. [id number] at Unknown with PMHx notable for hx gHTN, who presents for Fall down 14-16 steps today. She has received no prenatal care and does not know how far along she is. After the fall she felt like she lost some fluid per vagina.    Vaginal bleeding: No LOF: Yes Fetal Movement: Yes Contractions: No  --/--/PENDING (06/10 1916)  OB History  Gravida Para Term Preterm AB Living  8 6 6  0 1 6  SAB IAB Ectopic Multiple Live Births  0 0 0 0 6    # Outcome Date GA Lbr Len/2nd Weight Sex Delivery Anes PTL Lv  8 Current           7 Term 12/16/21 [redacted]w[redacted]d / 00:09 3230 g M VBAC None  LIV     Birth Comments: WDL  6 Term 10/15/20 [redacted]w[redacted]d 09:25 / 00:09 2801 g F VBAC EPI  LIV  5 Term 06/21/19 [redacted]w[redacted]d 12:50 / 00:09 2895 g F VBAC EPI  LIV  4 AB 2019          3 Term 11/28/16 [redacted]w[redacted]d  3459 g M VBAC EPI  LIV     Birth Comments: no complications  2 Term 05/16/15 [redacted]w[redacted]d  2438 g F CS-LTranv EPI  LIV     Birth Comments: no complications     Complications: Fetal distress affecting care  1 Term 12/12/12 [redacted]w[redacted]d  3204 g M Vag-Spont EPI N LIV     Birth Comments: no complications     Past Medical History:  Diagnosis Date   Anemia    Chlamydia vaginitis/cervicitis 01/13/2015   Medical history non-contributory    Patient denies medical problems     Past Surgical History:  Procedure Laterality Date   CESAREAN SECTION     ORIF MANDIBULAR FRACTURE N/A 04/15/2018   Procedure: OPEN REDUCTION INTERNAL FIXATION (ORIF) MANDIBULAR FRACTURE;  Surgeon: Ocie Doyne, DDS;  Location: [facility] OR;  Service: Oral Surgery;  Laterality: N/A;    Family History  Problem Relation Age of Onset   Hypertension Mother    Hypertension Maternal Grandmother    Asthma Neg Hx    Cancer Neg Hx    Diabetes Neg Hx    Kidney disease Neg Hx    Obesity Neg Hx     Social  History   Socioeconomic History   Marital status: Single    Spouse name: Not on file   Number of children: Not on file   Years of education: Not on file   Highest education level: Not on file  Occupational History    Comment: out of work  Tobacco Use   Smoking status: Never   Smokeless tobacco: Never  Vaping Use   Vaping Use: Never used  Substance and Sexual Activity   Alcohol use: Not Currently    Comment: socially    Drug use: Never   Sexual activity: Yes    Birth control/protection: None  Other Topics Concern   Not on file  Social History Narrative   ** Merged History Encounter **       Social Determinants of Health   Financial Resource Strain: Not on file  Food Insecurity: Food Insecurity Present (12/07/2021)   Hunger Vital Sign    Worried About Running Out of Food in the Last Year: Sometimes  true    Ran Out of Food in the Last Year: Never true  Transportation Needs: No Transportation Needs (12/07/2021)   PRAPARE - Administrator, Civil Service (Medical): No    Lack of Transportation (Non-Medical): No  Physical Activity: Not on file  Stress: Not on file  Social Connections: Not on file  Intimate Partner Violence: Not on file    No Known Allergies  No current facility-administered medications on file prior to encounter.   Current Outpatient Medications on File Prior to Encounter  Medication Sig Dispense Refill   Prenatal Vit-Fe Fumarate-FA (PREPLUS) 27-1 MG TABS Take 1 tablet by mouth daily. 30 tablet 6   furosemide (LASIX) 20 MG tablet Take 1 tablet (20 mg total) by mouth daily. 5 tablet 0   ibuprofen (ADVIL) 600 MG tablet Take 1 tablet (600 mg total) by mouth every 6 (six) hours. 30 tablet 0    ROS: Pertinent positives and negative per HPI, all others reviewed and negative  Physical Exam   BP 123/62 (BP Location: Right Arm)   Pulse 85   Temp 97.9 F (36.6 C) (Oral)   Resp 19   Ht 5\' 2"  (1.575 m)   Wt 79.8 kg   SpO2 100%   BMI 32.19 kg/m    Patient Vitals for the past 24 hrs:  BP Temp Temp src Pulse Resp SpO2 Height Weight  10/15/22 1839 123/62 97.9 F (36.6 C) Oral 85 19 100 % -- --  10/15/22 1831 -- -- -- -- -- -- 5\' 2"  (1.575 m) 79.8 kg    Physical Exam Vitals reviewed.  Constitutional:      Appearance: Normal appearance.  HENT:     Head: Normocephalic and atraumatic.     Right Ear: External ear normal.     Left Ear: External ear normal.  Cardiovascular:     Rate and Rhythm: Normal rate and regular rhythm.  Pulmonary:     Effort: Pulmonary effort is normal.     Breath sounds: Normal breath sounds.  Abdominal:     General: Abdomen is flat.     Palpations: Abdomen is soft.  Skin:    General: Skin is warm.     Capillary Refill: Capillary refill takes less than 2 seconds.  Psychiatric:        Mood and Affect: Mood normal.    Cervical Exam   N/a  Bedside Ultrasound done Pt informed that the ultrasound is considered a limited OB ultrasound and is not intended to be a complete ultrasound exam.  Patient also informed that the ultrasound is not being completed with the intent of assessing for fetal or placental anomalies or any pelvic abnormalities.  Explained that the purpose of today's ultrasound is to assess for  BPP, presentation, AFI, and viability.  Patient acknowledges the purpose of the exam and the limitations of the study.     FHT 153 bpm on [place]  Labs Results for orders placed or performed during the hospital encounter of 10/15/22 (from the past 24 hour(s))  Wet prep, genital     Status: Abnormal   Collection Time: 10/15/22  6:59 PM   Specimen: Vaginal  Result Value Ref Range   Yeast Wet Prep HPF POC NONE SEEN NONE SEEN   Trich, Wet Prep NONE SEEN NONE SEEN   Clue Cells Wet Prep HPF POC NONE SEEN NONE SEEN   WBC, Wet Prep HPF POC >=10 (A) <10   Sperm NONE SEEN   ABO/Rh  Status: None (Preliminary result)   Collection Time: 10/15/22  7:16 PM  Result Value Ref Range   ABO/RH(D) PENDING      Imaging No results found.  MAU Course  MDM: moderate  This patient presents to the ED for concern of   Chief Complaint  Patient presents with   Fall    MAU Course: - Initial eval, spec exam nml, no pooling, swabs and prenatal labs. No signs of physical trauma or loss of blood/pregnancy products - [place] preliminary nml. FHT 153, ~[redacted]w[redacted]d. EFW 43%ile, AFI nml. - Wet prep neg, CBC nml   Assessment and Plan  1. Fall, initial encounter  2. [redacted] weeks gestation of pregnancy  [place] nml preliminary. Referral sent to [facility] for prenatal care. Late PNC, NST reassuring for GA.   Dispostion: care transfer to Gerrit Heck, CNM  Myrtie Hawk, DO FMOB Fellow, Faculty practice [facility], Center for [facility] 10/15/22  7:33 PM  Reassessment (9:15 PM) -Patient requesting discharge. -Informed that protocol is to monitor for 4 hours for fetal well-being.  -Patient informed that if she needs to leave would have to be AMA. -NST reactive.  -Addressed questions regarding results.  Instructed to refer to mychart. -Patient to leave AMA to provide care for other children. -Informed that office will be calling her to schedule new ob appt. -Encouraged to call primary office or return to MAU if symptoms worsen or with the onset of new symptoms. -Nurse to provide AMA paperwork  Cherre Robins MSN, CNM Advanced Practice Provider, Center for [facility]

## 2022-10-16 ENCOUNTER — Telehealth: Payer: Self-pay | Admitting: Family Medicine

## 2022-10-16 LAB — GC/CHLAMYDIA PROBE AMP (~~LOC~~) NOT AT ARMC
Chlamydia: NEGATIVE
Comment: NEGATIVE
Comment: NORMAL
Neisseria Gonorrhea: NEGATIVE

## 2022-10-16 LAB — RPR: RPR Ser Ql: NONREACTIVE

## 2022-10-16 NOTE — Telephone Encounter (Signed)
Attempted to reach patient to schedule appointment.   

## 2022-10-17 LAB — RUBELLA SCREEN: Rubella: 1.46 index (ref >0.99)

## 2022-11-05 IMAGING — US US FETAL BPP W/ NON-STRESS
1 series · 13 of 14 positions shown · non-contrast
Comparison: none

[Series 1: us fetal bpp w/ non-stress · 14 acquisitions, 13 frames shown]
[im 1/14]
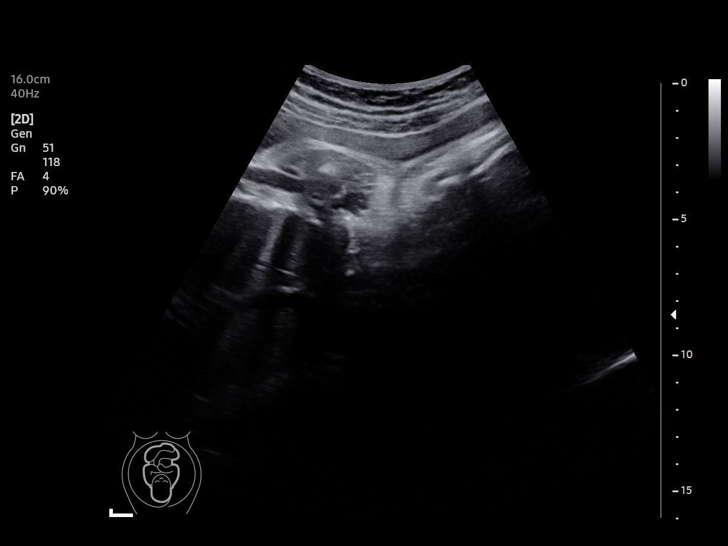
[im 2/14]
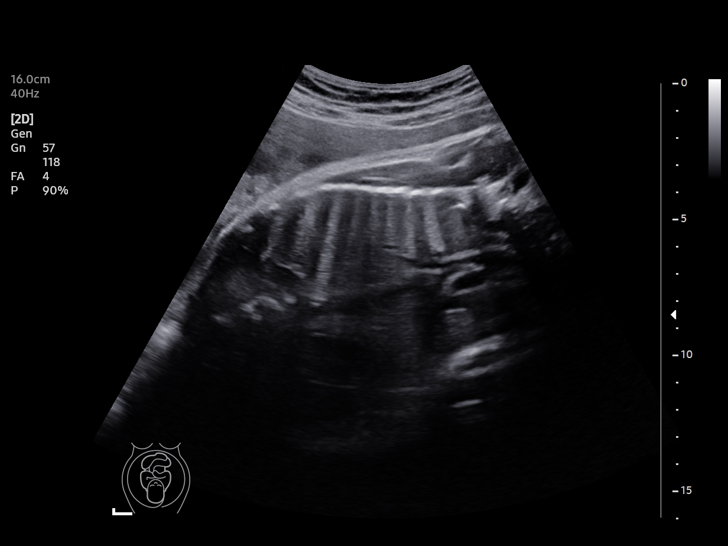
[im 3/14]
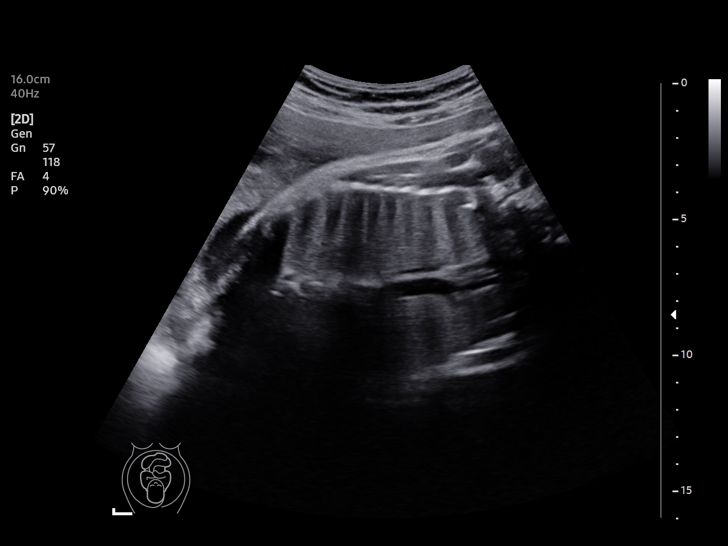
[im 4/14]
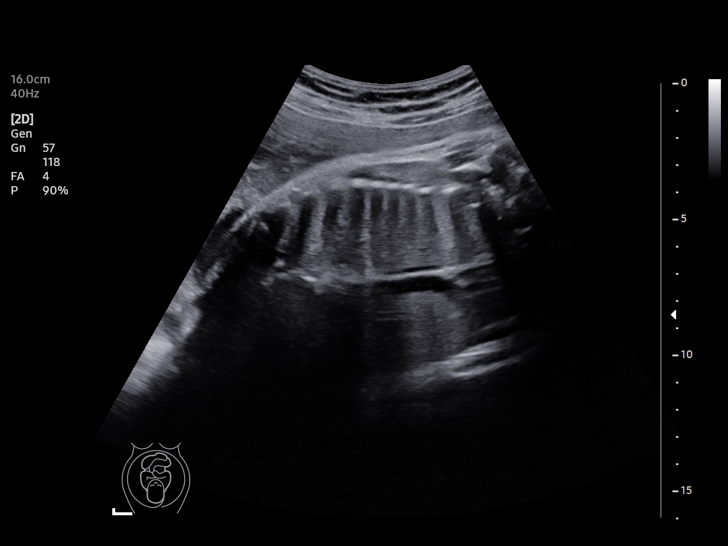
[im 5/14]
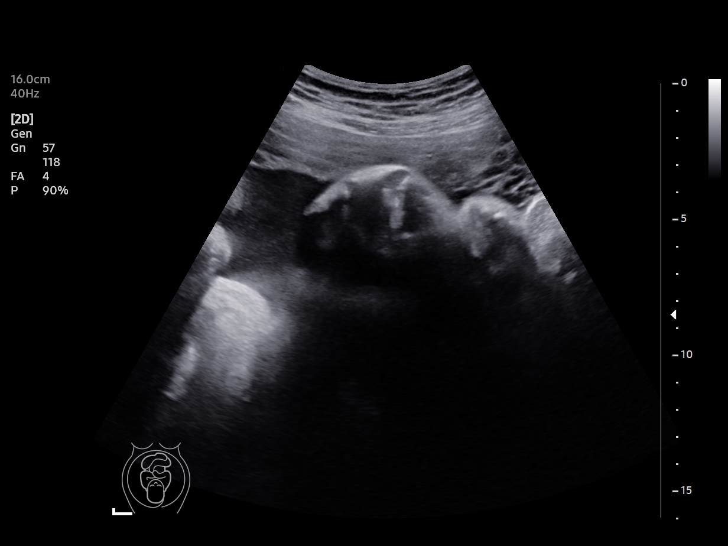
[im 6/14]
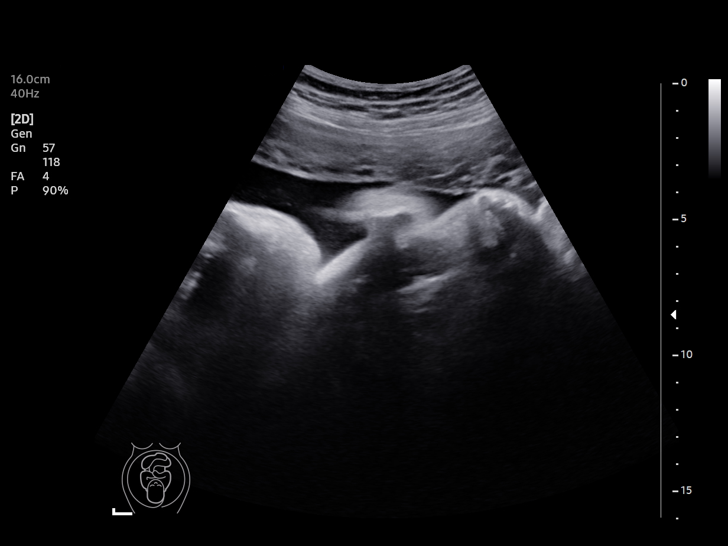
[im 8/14]
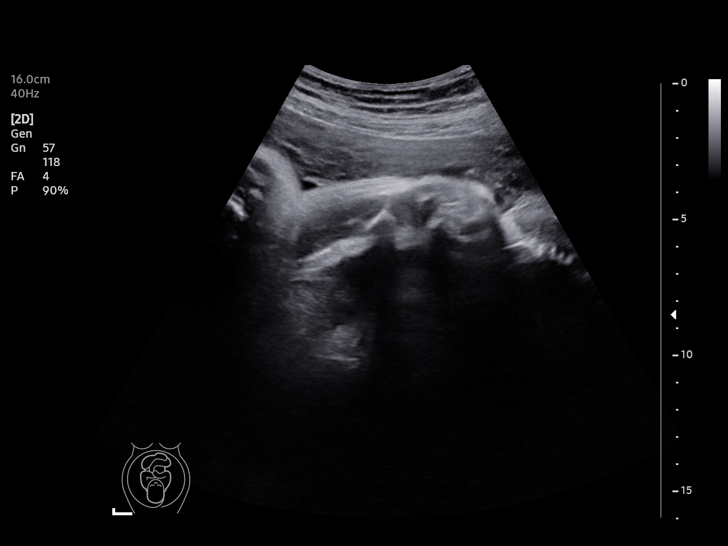
[im 9/14]
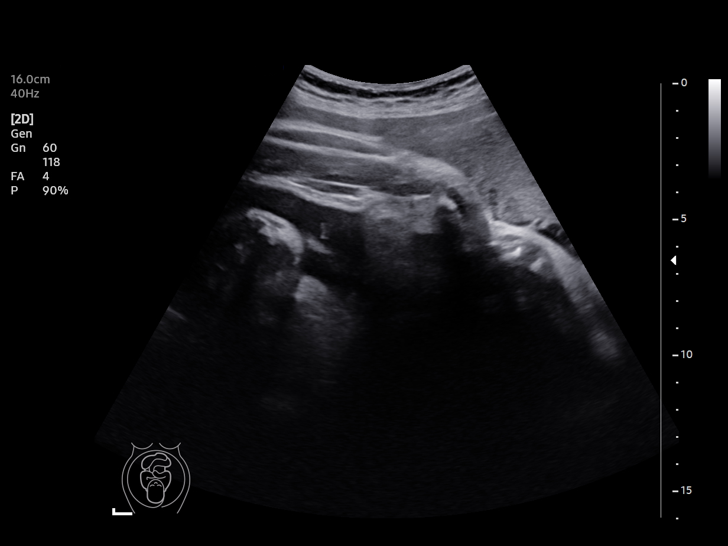
[im 10/14]
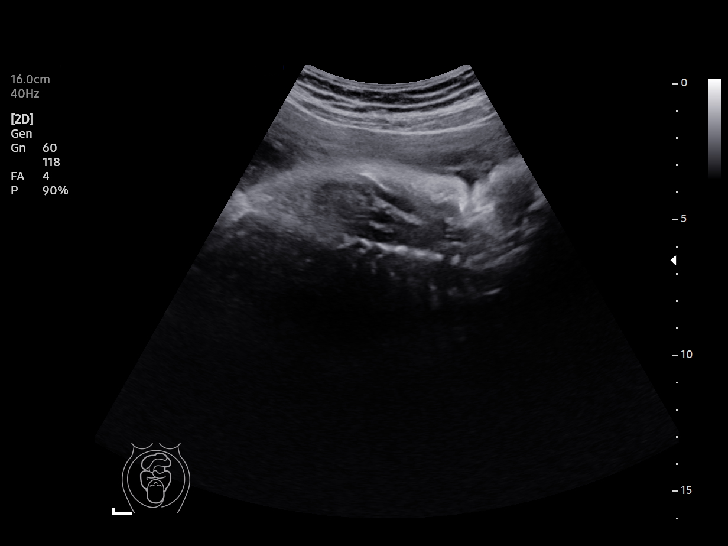
[im 11/14]
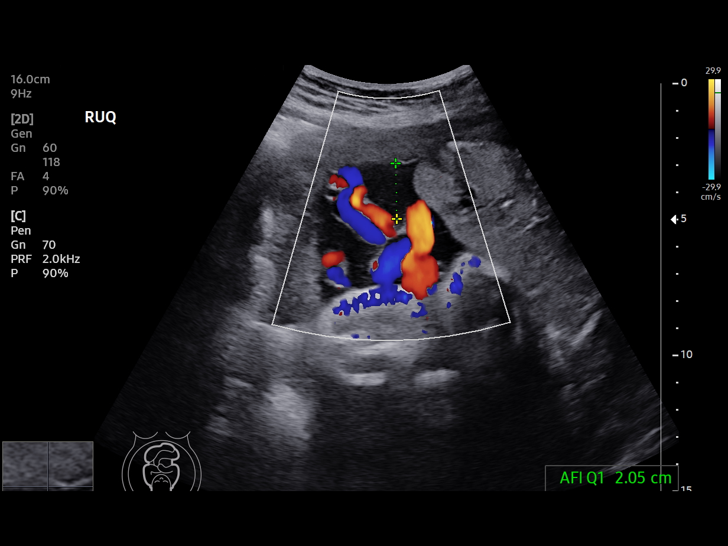
[im 12/14]
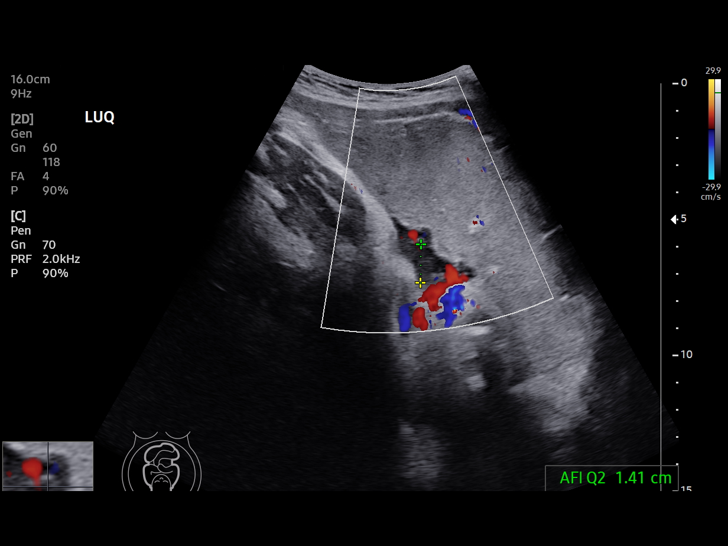
[im 13/14]
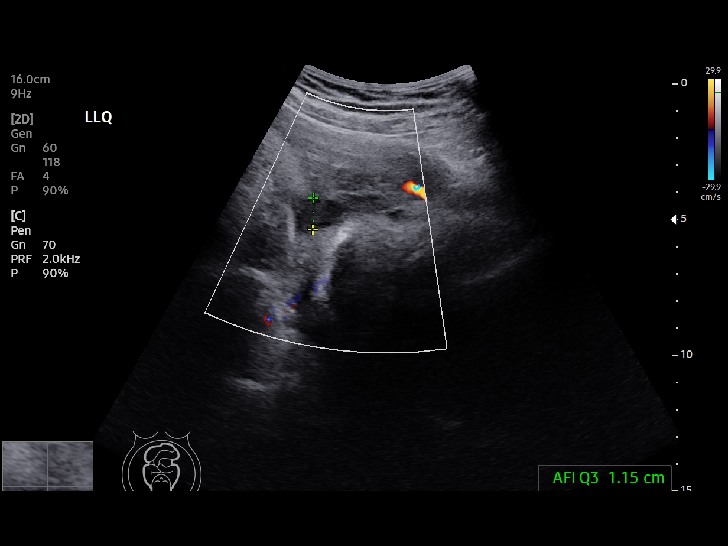
[im 14/14]
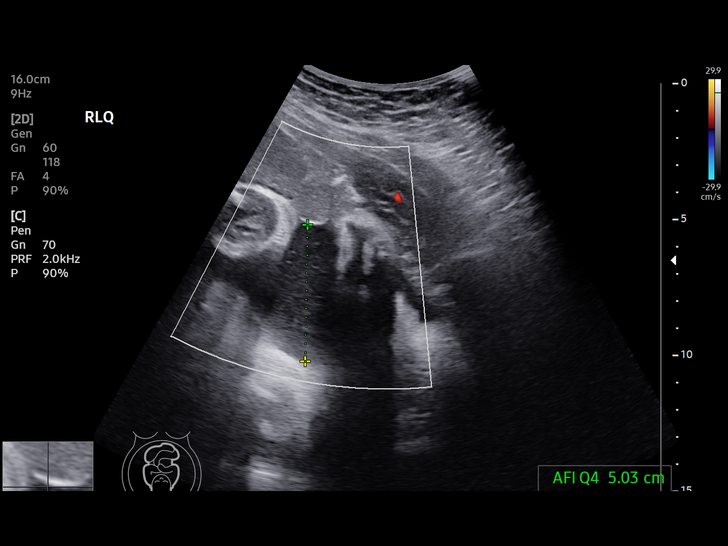

[13 of 14 positions shown; findings below may reference images not displayed]

[REDACTED]care at

 1  US FETAL BPP W/NONSTRESS              76818.4     SRAVYA GOYAL

Service(s) Provided

Indications

 40 weeks gestation of pregnancy
 Postdate pregnancy (40-42 weeks)
Fetal Evaluation

 Num Of Fetuses:         1
 Preg. Location:         Intrauterine
 Cardiac Activity:       Observed
 Presentation:           Cephalic

 Amniotic Fluid
 AFI FV:      Within normal limits

 AFI Sum(cm)     %Tile       Largest Pocket(cm)
 9.64            30

 RUQ(cm)       RLQ(cm)       LUQ(cm)        LLQ(cm)

Biophysical Evaluation

 Amniotic F.V:   Pocket => 2 cm             F. Tone:        Observed
 F. Movement:    Observed                   N.S.T:          Reactive
 F. Breathing:   Observed                   Score:          [DATE]
OB History

 Gravidity:    6         Term:   4        Prem:   0        SAB:   0
 TOP:          0       Ectopic:  0        Living: 4
Gestational Age

 Best:          40w 5d     Det. By:  Early Ultrasound         EDD:   10/09/20
                                     (03/24/20)
Impression

 Reassuring testing.
Recommendations

 Will send for induction today for possible gestational
 hypertension at term.
                Molinari, Yiyo

## 2022-11-09 ENCOUNTER — Encounter: Payer: Medicaid Other | Admitting: Obstetrics & Gynecology

## 2022-11-26 ENCOUNTER — Encounter: Payer: Medicaid Other | Admitting: Obstetrics and Gynecology

## 2022-12-04 ENCOUNTER — Encounter: Payer: Medicaid Other | Admitting: Family Medicine

## 2022-12-31 ENCOUNTER — Other Ambulatory Visit: Payer: Self-pay

## 2022-12-31 ENCOUNTER — Ambulatory Visit (INDEPENDENT_AMBULATORY_CARE_PROVIDER_SITE_OTHER): Payer: Medicaid Other | Admitting: Obstetrics and Gynecology

## 2022-12-31 ENCOUNTER — Other Ambulatory Visit (HOSPITAL_COMMUNITY)
Admission: RE | Admit: 2022-12-31 | Discharge: 2022-12-31 | Disposition: A | Discharge status: Home / Self Care | Payer: Medicaid Other | Source: Ambulatory Visit | Attending: Obstetrics and Gynecology | Admitting: Obstetrics and Gynecology

## 2022-12-31 ENCOUNTER — Encounter: Payer: Self-pay | Admitting: Obstetrics and Gynecology

## 2022-12-31 VITALS — BP 117/73 | HR 81 | Wt 183.0 lb

## 2022-12-31 DIAGNOSIS — R87612 Low grade squamous intraepithelial lesion on cytologic smear of cervix (LGSIL): Secondary | ICD-10-CM | POA: Diagnosis not present

## 2022-12-31 DIAGNOSIS — Z98891 History of uterine scar from previous surgery: Secondary | ICD-10-CM

## 2022-12-31 DIAGNOSIS — O0993 Supervision of high risk pregnancy, unspecified, third trimester: Secondary | ICD-10-CM | POA: Diagnosis not present

## 2022-12-31 DIAGNOSIS — O26893 Other specified pregnancy related conditions, third trimester: Secondary | ICD-10-CM

## 2022-12-31 DIAGNOSIS — O26899 Other specified pregnancy related conditions, unspecified trimester: Secondary | ICD-10-CM

## 2022-12-31 DIAGNOSIS — Z3A37 37 weeks gestation of pregnancy: Secondary | ICD-10-CM | POA: Diagnosis not present

## 2022-12-31 DIAGNOSIS — O0990 Supervision of high risk pregnancy, unspecified, unspecified trimester: Secondary | ICD-10-CM | POA: Diagnosis present

## 2022-12-31 DIAGNOSIS — Z3009 Encounter for other general counseling and advice on contraception: Secondary | ICD-10-CM | POA: Insufficient documentation

## 2022-12-31 DIAGNOSIS — O34219 Maternal care for unspecified type scar from previous cesarean delivery: Secondary | ICD-10-CM

## 2022-12-31 DIAGNOSIS — Z8759 Personal history of other complications of pregnancy, childbirth and the puerperium: Secondary | ICD-10-CM

## 2022-12-31 DIAGNOSIS — Z6791 Unspecified blood type, Rh negative: Secondary | ICD-10-CM | POA: Diagnosis not present

## 2022-12-31 MED ORDER — RHO D IMMUNE GLOBULIN 1500 UNIT/2ML IJ SOSY
300.0000 ug | PREFILLED_SYRINGE | Freq: Once | INTRAMUSCULAR | Status: AC
  Administered 2022-12-31: 300 ug via INTRAMUSCULAR

## 2022-12-31 NOTE — Progress Notes (Signed)
Subjective:  Samantha Hood is a 27 y.o. [id number] at [redacted]w[redacted]d being seen today for her first OB visit. ongoing prenatal care. EDD by third trimester U/S. Late to prenatal care. H/O LTCS x1 , followed by VBAC x 4.  H/O GHTN. Denies any chronic medical problems or medications.  She is currently monitored for the following issues for this low-risk pregnancy and has History of VBAC; Previous cesarean section complicating pregnancy, antepartum condition or complication; Rh negative state in antepartum period; Pap smear abnormality of cervix with LGSIL, +HPV; History of gestational hypertension; Supervision of high risk pregnancy, antepartum; and Unwanted fertility on their problem list.  Patient reports  general discomforts of pregnancy .  Contractions: Not present. Vag. Bleeding: None.  Movement: Present. Denies leaking of fluid.   The following portions of the patient's history were reviewed and updated as appropriate: allergies, current medications, past family history, past medical history, past social history, past surgical history and problem list. Problem list updated.  Objective:   Vitals:   12/31/22 1025  BP: 117/73  Pulse: 81  Weight: 183 lb (83 kg)    Fetal Status: Fetal Heart Rate (bpm): 138   Movement: Present     General:  Alert, oriented and cooperative. Patient is in no acute distress.  Skin: Skin is warm and dry. No rash noted.   Cardiovascular: Normal heart rate noted  Respiratory: Normal respiratory effort, no problems with respiration noted  Abdomen: Soft, gravid, appropriate for gestational age. Pain/Pressure: Absent     Pelvic:  Cervical exam performed        Extremities: Normal range of motion.  Edema: None  Mental Status: Normal mood and affect. Normal behavior. Normal judgment and thought content.   Urinalysis:      Assessment and Plan:  Pregnancy: [id number] at [redacted]w[redacted]d  1. Supervision of high risk pregnancy, antepartum Labor precauitons - GC/Chlamydia probe amp ([facility])not at [facility] - Culture, beta strep (group b only) - rho (d) immune globulin (RHIG/RHOPHYLAC) injection 300 mcg  2. History of gestational hypertension BP stable No S/Sx at present  3. Rh negative state in antepartum period  - rho (d) immune globulin (RHIG/RHOPHYLAC) injection 300 mcg  4. Pap smear abnormality of cervix with LGSIL, +HPV Repeat pap at postpartum visit  5. Previous cesarean section complicating pregnancy, antepartum condition or complication VBAC x 4  6. History of VBAC TOLAC consent today  7. Unwanted fertility BTL papers today Pt also considering IUD Pt aware of 30 day waiting period  Term labor symptoms and general obstetric precautions including but not limited to vaginal bleeding, contractions, leaking of fluid and fetal movement were reviewed in detail with the patient. Please refer to After Visit Summary for other counseling recommendations.  Return in about 1 week (around 01/07/2023) for OB visit, face to face, any provider.   Hermina Staggers, MD

## 2023-01-02 LAB — GC/CHLAMYDIA PROBE AMP (~~LOC~~) NOT AT ARMC
Chlamydia: NEGATIVE
Comment: NEGATIVE
Comment: NORMAL
Neisseria Gonorrhea: NEGATIVE

## 2023-01-03 LAB — CULTURE, BETA STREP (GROUP B ONLY): Strep Gp B Culture: POSITIVE — AB

## 2023-01-04 ENCOUNTER — Encounter: Payer: Self-pay | Admitting: Obstetrics and Gynecology

## 2023-01-04 DIAGNOSIS — O99820 Streptococcus B carrier state complicating pregnancy: Secondary | ICD-10-CM | POA: Insufficient documentation

## 2023-01-08 ENCOUNTER — Encounter: Payer: Medicaid Other | Admitting: Certified Nurse Midwife

## 2023-01-08 ENCOUNTER — Other Ambulatory Visit: Payer: Self-pay

## 2023-01-08 ENCOUNTER — Ambulatory Visit: Payer: Medicaid Other | Admitting: Advanced Practice Midwife

## 2023-01-08 VITALS — BP 123/71 | HR 80 | Wt 181.3 lb

## 2023-01-08 DIAGNOSIS — Z641 Problems related to multiparity: Secondary | ICD-10-CM

## 2023-01-08 DIAGNOSIS — Z3A38 38 weeks gestation of pregnancy: Secondary | ICD-10-CM

## 2023-01-08 DIAGNOSIS — Z3009 Encounter for other general counseling and advice on contraception: Secondary | ICD-10-CM

## 2023-01-08 DIAGNOSIS — Z8759 Personal history of other complications of pregnancy, childbirth and the puerperium: Secondary | ICD-10-CM

## 2023-01-08 DIAGNOSIS — O0990 Supervision of high risk pregnancy, unspecified, unspecified trimester: Secondary | ICD-10-CM

## 2023-01-08 DIAGNOSIS — R87612 Low grade squamous intraepithelial lesion on cytologic smear of cervix (LGSIL): Secondary | ICD-10-CM

## 2023-01-08 DIAGNOSIS — O99820 Streptococcus B carrier state complicating pregnancy: Secondary | ICD-10-CM

## 2023-01-08 DIAGNOSIS — O0930 Supervision of pregnancy with insufficient antenatal care, unspecified trimester: Secondary | ICD-10-CM | POA: Insufficient documentation

## 2023-01-08 NOTE — Progress Notes (Signed)
Left without being seen by provider. FHR and BP Nml.

## 2023-01-15 ENCOUNTER — Ambulatory Visit (INDEPENDENT_AMBULATORY_CARE_PROVIDER_SITE_OTHER): Payer: Medicaid Other | Admitting: Family Medicine

## 2023-01-15 ENCOUNTER — Encounter: Payer: Self-pay | Admitting: Family Medicine

## 2023-01-15 ENCOUNTER — Other Ambulatory Visit: Payer: Self-pay

## 2023-01-15 ENCOUNTER — Encounter (HOSPITAL_COMMUNITY): Payer: Self-pay | Admitting: Obstetrics & Gynecology

## 2023-01-15 ENCOUNTER — Inpatient Hospital Stay (HOSPITAL_COMMUNITY)
Admission: AD | Admit: 2023-01-15 | Discharge: 2023-01-17 | DRG: 807 | Disposition: A | Discharge status: Home / Self Care | Payer: Medicaid Other | Attending: Obstetrics and Gynecology | Admitting: Obstetrics and Gynecology

## 2023-01-15 VITALS — BP 156/107 | HR 116 | Wt 185.4 lb

## 2023-01-15 DIAGNOSIS — Z6791 Unspecified blood type, Rh negative: Secondary | ICD-10-CM

## 2023-01-15 DIAGNOSIS — O34211 Maternal care for low transverse scar from previous cesarean delivery: Secondary | ICD-10-CM | POA: Diagnosis present

## 2023-01-15 DIAGNOSIS — O99820 Streptococcus B carrier state complicating pregnancy: Secondary | ICD-10-CM | POA: Diagnosis not present

## 2023-01-15 DIAGNOSIS — O99824 Streptococcus B carrier state complicating childbirth: Secondary | ICD-10-CM | POA: Diagnosis present

## 2023-01-15 DIAGNOSIS — O0993 Supervision of high risk pregnancy, unspecified, third trimester: Secondary | ICD-10-CM

## 2023-01-15 DIAGNOSIS — Z30017 Encounter for initial prescription of implantable subdermal contraceptive: Secondary | ICD-10-CM | POA: Diagnosis not present

## 2023-01-15 DIAGNOSIS — O0990 Supervision of high risk pregnancy, unspecified, unspecified trimester: Secondary | ICD-10-CM

## 2023-01-15 DIAGNOSIS — Z3A39 39 weeks gestation of pregnancy: Secondary | ICD-10-CM

## 2023-01-15 DIAGNOSIS — O0933 Supervision of pregnancy with insufficient antenatal care, third trimester: Secondary | ICD-10-CM

## 2023-01-15 DIAGNOSIS — O139 Gestational [pregnancy-induced] hypertension without significant proteinuria, unspecified trimester: Secondary | ICD-10-CM | POA: Diagnosis present

## 2023-01-15 DIAGNOSIS — O26899 Other specified pregnancy related conditions, unspecified trimester: Secondary | ICD-10-CM

## 2023-01-15 DIAGNOSIS — R87612 Low grade squamous intraepithelial lesion on cytologic smear of cervix (LGSIL): Secondary | ICD-10-CM | POA: Diagnosis present

## 2023-01-15 DIAGNOSIS — O26893 Other specified pregnancy related conditions, third trimester: Secondary | ICD-10-CM | POA: Diagnosis present

## 2023-01-15 DIAGNOSIS — O134 Gestational [pregnancy-induced] hypertension without significant proteinuria, complicating childbirth: Secondary | ICD-10-CM | POA: Diagnosis not present

## 2023-01-15 DIAGNOSIS — Z3009 Encounter for other general counseling and advice on contraception: Secondary | ICD-10-CM

## 2023-01-15 DIAGNOSIS — O133 Gestational [pregnancy-induced] hypertension without significant proteinuria, third trimester: Secondary | ICD-10-CM | POA: Diagnosis not present

## 2023-01-15 DIAGNOSIS — O0930 Supervision of pregnancy with insufficient antenatal care, unspecified trimester: Secondary | ICD-10-CM

## 2023-01-15 DIAGNOSIS — Z8759 Personal history of other complications of pregnancy, childbirth and the puerperium: Secondary | ICD-10-CM

## 2023-01-15 DIAGNOSIS — O34219 Maternal care for unspecified type scar from previous cesarean delivery: Secondary | ICD-10-CM

## 2023-01-15 DIAGNOSIS — Z8249 Family history of ischemic heart disease and other diseases of the circulatory system: Secondary | ICD-10-CM | POA: Diagnosis not present

## 2023-01-15 DIAGNOSIS — Z23 Encounter for immunization: Secondary | ICD-10-CM | POA: Diagnosis not present

## 2023-01-15 DIAGNOSIS — Z98891 History of uterine scar from previous surgery: Secondary | ICD-10-CM

## 2023-01-15 HISTORY — DX: Essential (primary) hypertension: I10

## 2023-01-15 LAB — CBC
HCT: 33.6 % — ABNORMAL LOW (ref 36.0–46.0)
Hemoglobin: 10.9 g/dL — ABNORMAL LOW (ref 12.0–15.0)
MCH: 26.6 pg (ref 26.0–34.0)
MCHC: 32.4 g/dL (ref 30.0–36.0)
MCV: 82 fL (ref 80.0–100.0)
Platelets: 143 10*3/uL — ABNORMAL LOW (ref 150–400)
RBC: 4.1 MIL/uL (ref 3.87–5.11)
RDW: 15.2 % (ref 11.5–15.5)
WBC: 7.7 10*3/uL (ref 4.0–10.5)
nRBC: 0 % (ref 0.0–0.2)

## 2023-01-15 LAB — COMPREHENSIVE METABOLIC PANEL
ALT: 13 U/L (ref 0–44)
AST: 20 U/L (ref 15–41)
Albumin: 2.7 g/dL — ABNORMAL LOW (ref 3.5–5.0)
Alkaline Phosphatase: 190 U/L — ABNORMAL HIGH (ref 38–126)
Anion gap: 8 (ref 5–15)
BUN: 6 mg/dL (ref 6–20)
CO2: 20 mmol/L — ABNORMAL LOW (ref 22–32)
Calcium: 8.2 mg/dL — ABNORMAL LOW (ref 8.9–10.3)
Chloride: 104 mmol/L (ref 98–111)
Creatinine, Ser: 0.61 mg/dL (ref 0.44–1.00)
GFR, Estimated: >60 mL/min (ref >60)
Glucose, Bld: 81 mg/dL (ref 70–99)
Potassium: 3.5 mmol/L (ref 3.5–5.1)
Sodium: 132 mmol/L — ABNORMAL LOW (ref 135–145)
Total Bilirubin: 0.4 mg/dL (ref 0.3–1.2)
Total Protein: 6.1 g/dL — ABNORMAL LOW (ref 6.5–8.1)

## 2023-01-15 LAB — URINALYSIS, ROUTINE W REFLEX MICROSCOPIC
Bilirubin Urine: NEGATIVE
Glucose, UA: NEGATIVE mg/dL
Hgb urine dipstick: NEGATIVE
Ketones, ur: NEGATIVE mg/dL
Nitrite: NEGATIVE
Protein, ur: NEGATIVE mg/dL
Specific Gravity, Urine: 1.017 (ref 1.005–1.030)
pH: 5 (ref 5.0–8.0)

## 2023-01-15 MED ORDER — LACTATED RINGERS IV SOLN: 500.0000 mL | INTRAVENOUS | Status: DC | PRN

## 2023-01-15 MED ORDER — LIDOCAINE HCL (PF) 1 % IJ SOLN: 30.0000 mL | INTRAMUSCULAR | Status: DC | PRN

## 2023-01-15 MED ORDER — OXYTOCIN BOLUS FROM INFUSION
333.0000 mL | Freq: Once | INTRAVENOUS | Status: AC
  Administered 2023-01-16: 333 mL via INTRAVENOUS

## 2023-01-15 MED ORDER — FENTANYL CITRATE (PF) 100 MCG/2ML IJ SOLN
50.0000 ug | INTRAMUSCULAR | Status: DC | PRN
  Filled 2023-01-15: qty 2

## 2023-01-15 MED ORDER — OXYTOCIN-SODIUM CHLORIDE 30-0.9 UT/500ML-% IV SOLN: 2.5000 [IU]/h | INTRAVENOUS | Status: DC

## 2023-01-15 MED ORDER — PENICILLIN G POT IN DEXTROSE 60000 UNIT/ML IV SOLN
3.0000 10*6.[IU] | INTRAVENOUS | Status: DC
  Administered 2023-01-16 (×4): 3 10*6.[IU] via INTRAVENOUS
  Filled 2023-01-15 (×4): qty 50

## 2023-01-15 MED ORDER — ONDANSETRON HCL 4 MG/2ML IJ SOLN
4.0000 mg | Freq: Four times a day (QID) | INTRAMUSCULAR | Status: DC | PRN
  Administered 2023-01-16: 4 mg via INTRAVENOUS
  Filled 2023-01-15: qty 2

## 2023-01-15 MED ORDER — ACETAMINOPHEN 325 MG PO TABS: 650.0000 mg | ORAL_TABLET | ORAL | Status: DC | PRN

## 2023-01-15 MED ORDER — SODIUM CHLORIDE 0.9 % IV SOLN
5.0000 10*6.[IU] | Freq: Once | INTRAVENOUS | Status: AC
  Administered 2023-01-15: 5 10*6.[IU] via INTRAVENOUS
  Filled 2023-01-15: qty 5

## 2023-01-15 MED ORDER — LACTATED RINGERS IV SOLN: INTRAVENOUS | Status: DC

## 2023-01-15 MED ORDER — SOD CITRATE-CITRIC ACID 500-334 MG/5ML PO SOLN: 30.0000 mL | ORAL | Status: DC | PRN

## 2023-01-15 NOTE — Progress Notes (Signed)
Unable to determine baseline. Baby is very active. Pt talking on phone.

## 2023-01-15 NOTE — MAU Note (Signed)
..  Samantha Hood is a 27 y.o. at [redacted]w[redacted]d here in MAU reporting having appt today and her b/p was elevated. Pt was told to come in so they could get the baby delivered. Denies VB or LOF. Reports good FM. No pain  Onset of complaint: today  Pain score: 0 Vitals:   01/15/23 1953 01/15/23 1956  BP:  (!) 146/87  Pulse: 89   Resp: 18   Temp: 98.5 F (36.9 C)   SpO2: 100%      FHT:146 Lab orders placed from triage:

## 2023-01-15 NOTE — Patient Instructions (Signed)

## 2023-01-15 NOTE — MAU Provider Note (Signed)
Chief Complaint:  Hypertension   Event Date/Time   First Provider Initiated Contact with Patient 01/15/23 2042     HPI: Samantha Hood is a 27 y.o. [id number] at 80w5dwho presents to maternity admissions reporting elevated blood pressures in office today.  Some were severe range   Was sent here for delivery.. She reports good fetal movement, denies LOF, vaginal bleeding, vaginal itching/burning, urinary symptoms, h/a, dizziness, n/v, diarrhea, constipation or fever/chills.  She denies headache, visual changes or RUQ abdominal pain.  Hypertension The current episode started today. The problem has been waxing and waning since onset. Pertinent negatives include no blurred vision, chest pain, headaches, palpitations or peripheral edema. There are no associated agents to hypertension. Past treatments include nothing. There are no compliance problems.    RN Note: Samantha Hood is a 27 y.o. at [redacted]w[redacted]d here in MAU reporting having appt today and her b/p was elevated. Pt was told to come in so they could get the baby delivered. Denies VB or LOF. Reports good FM. No pain  Onset of complaint: today  Pain score: 0  Past Medical History: Past Medical History:  Diagnosis Date   Anemia    Chlamydia vaginitis/cervicitis 01/13/2015   Hypertension    Medical history non-contributory    Patient denies medical problems     Past obstetric history: OB History  Gravida Para Term Preterm AB Living  8 6 6  0 1 6  SAB IAB Ectopic Multiple Live Births  0 0 0 0 6    # Outcome Date GA Lbr Len/2nd Weight Sex Type Anes PTL Lv  8 Current           7 Term 12/16/21 [redacted]w[redacted]d / 00:09 3230 g M VBAC None  LIV     Birth Comments: WDL  6 Term 10/15/20 [redacted]w[redacted]d 09:25 / 00:09 2801 g F VBAC EPI  LIV  5 Term 06/21/19 [redacted]w[redacted]d 12:50 / 00:09 2895 g F VBAC EPI  LIV  4 AB 2019          3 Term 11/28/16 [redacted]w[redacted]d  3459 g M VBAC EPI  LIV     Birth Comments: no complications  2 Term 05/16/15 [redacted]w[redacted]d  2438 g F CS-LTranv EPI  LIV     Birth  Comments: no complications     Complications: Fetal distress affecting care  1 Term 12/12/12 [redacted]w[redacted]d  3204 g M Vag-Spont EPI N LIV     Birth Comments: no complications    Past Surgical History: Past Surgical History:  Procedure Laterality Date   CESAREAN SECTION     ORIF MANDIBULAR FRACTURE N/A 04/15/2018   Procedure: OPEN REDUCTION INTERNAL FIXATION (ORIF) MANDIBULAR FRACTURE;  Surgeon: Ocie Doyne, DDS;  Location: [facility] OR;  Service: Oral Surgery;  Laterality: N/A;    Family History: Family History  Problem Relation Age of Onset   Hypertension Mother    Hypertension Maternal Grandmother    Asthma Neg Hx    Cancer Neg Hx    Diabetes Neg Hx    Kidney disease Neg Hx    Obesity Neg Hx     Social History: Social History   Tobacco Use   Smoking status: Never   Smokeless tobacco: Never  Vaping Use   Vaping status: Never Used  Substance Use Topics   Alcohol use: Not Currently    Comment: socially    Drug use: Never    Allergies: No Known Allergies  Meds:  Medications Prior to Admission  Medication Sig Dispense Refill Last  Dose   Prenatal Vit-Fe Fumarate-FA (PREPLUS) 27-1 MG TABS Take 1 tablet by mouth daily. (Patient not taking: Reported on 01/15/2023) 30 tablet 6 Not Taking    I have reviewed patient's Past Medical Hx, Surgical Hx, Family Hx, Social Hx, medications and allergies.   ROS:  Review of Systems  Eyes:  Negative for blurred vision.  Cardiovascular:  Negative for chest pain and palpitations.  Neurological:  Negative for headaches.   Other systems negative  Physical Exam  Patient Vitals for the past 24 hrs:  BP Temp Pulse Resp SpO2 Height Weight  01/15/23 2030 130/78 -- 88 -- -- -- --  01/15/23 2018 133/85 -- 92 -- -- -- --  01/15/23 1956 (!) 146/87 -- -- -- -- -- --  01/15/23 1953 -- 98.5 F (36.9 C) 89 18 100 % 5\' 3"  (1.6 m) 84.4 kg   Constitutional: Well-developed, well-nourished female in no acute distress.  Cardiovascular: normal rate and  rhythm Respiratory: normal effort, clear to auscultation bilaterally GI: Abd soft, non-tender, gravid appropriate for gestational age.   No rebound or guarding. MS: Extremities nontender, no edema, normal ROM Neurologic: Alert and oriented x 4.  GU: Neg CVAT.  PELVIC EXAM: deferred   FHT:  Baseline 140 , moderate variability, accelerations present, no decelerations Contractions: Irregular     Labs: Results for orders placed or performed during the hospital encounter of 01/15/23 (from the past 24 hour(s))  Urinalysis, Routine w reflex microscopic -Urine, Clean Catch     Status: Abnormal   Collection Time: 01/15/23  8:22 PM  Result Value Ref Range   Color, Urine YELLOW YELLOW   APPearance HAZY (A) CLEAR   Specific Gravity, Urine 1.017 1.005 - 1.030   pH 5.0 5.0 - 8.0   Glucose, UA NEGATIVE NEGATIVE mg/dL   Hgb urine dipstick NEGATIVE NEGATIVE   Bilirubin Urine NEGATIVE NEGATIVE   Ketones, ur NEGATIVE NEGATIVE mg/dL   Protein, ur NEGATIVE NEGATIVE mg/dL   Nitrite NEGATIVE NEGATIVE   Leukocytes,Ua SMALL (A) NEGATIVE   RBC / HPF 0-5 0 - 5 RBC/hpf   WBC, UA 0-5 0 - 5 WBC/hpf   Bacteria, UA RARE (A) NONE SEEN   Squamous Epithelial / HPF 11-20 0 - 5 /HPF   Mucus PRESENT   Type and screen [facility]     Status: None (Preliminary result)   Collection Time: 01/15/23  9:19 PM  Result Value Ref Range   ABO/RH(D) PENDING    Antibody Screen PENDING    Sample Expiration      01/18/2023,2359 Performed at [facility], [address], [place], [place] [address]   CBC     Status: Abnormal   Collection Time: 01/15/23  9:55 PM  Result Value Ref Range   WBC 7.7 4.0 - 10.5 K/uL   RBC 4.10 3.87 - 5.11 MIL/uL   Hemoglobin 10.9 (L) 12.0 - 15.0 g/dL   HCT 60.4 (L) 54.0 - 98.1 %   MCV 82.0 80.0 - 100.0 fL   MCH 26.6 26.0 - 34.0 pg   MCHC 32.4 30.0 - 36.0 g/dL   RDW 19.1 47.8 - 29.5 %   Platelets 143 (L) 150 - 400 K/uL   nRBC 0.0 0.0 - 0.2 %    --/--/B NEG (06/10  1916)  Imaging:  No results found.  MAU Course/MDM: I have reviewed the triage vital signs and the nursing notes.   Pertinent labs & imaging results that were available during my care of the  patient were reviewed by me and considered in my medical decision making (see chart for details).      I have reviewed her medical records including past results, notes and treatments.   I have ordered labs and reviewed results. Other results pending NST reviewed Consult Dr Shawnie Pons with presentation, exam findings and test results.  Treatments in MAU included EFM.    Assessment: Single IUP at [redacted]w[redacted]d Gestational hypertension, rule out preeclampsia  Plan: Admit to Labor and Delivery Routine orders IOL  Wynelle Bourgeois CNM, MSN Certified Nurse-Midwife 01/15/2023 8:42 PM

## 2023-01-15 NOTE — MAU Provider Note (Signed)
Chief Complaint:  Hypertension   Event Date/Time   First Provider Initiated Contact with Patient 01/15/23 2042     HPI: Samantha Hood is a 27 y.o. [id number] at 47w5dwho presents to maternity admissions reporting elevated blood pressures in office today.  Some were severe range   Was sent here for delivery.. She reports good fetal movement, denies LOF, vaginal bleeding, vaginal itching/burning, urinary symptoms, h/a, dizziness, n/v, diarrhea, constipation or fever/chills.  She denies headache, visual changes or RUQ abdominal pain.  Hypertension The current episode started today. The problem has been waxing and waning since onset. Pertinent negatives include no blurred vision, chest pain, headaches, palpitations or peripheral edema. There are no associated agents to hypertension. Past treatments include nothing. There are no compliance problems.    RN Note: Samantha Hood is a 27 y.o. at [redacted]w[redacted]d here in MAU reporting having appt today and her b/p was elevated. Pt was told to come in so they could get the baby delivered. Denies VB or LOF. Reports good FM. No pain  Onset of complaint: today  Pain score: 0  Past Medical History: Past Medical History:  Diagnosis Date   Anemia    Chlamydia vaginitis/cervicitis 01/13/2015   Hypertension    Medical history non-contributory    Patient denies medical problems     Past obstetric history: OB History  Gravida Para Term Preterm AB Living  8 6 6  0 1 6  SAB IAB Ectopic Multiple Live Births  0 0 0 0 6    # Outcome Date GA Lbr Len/2nd Weight Sex Type Anes PTL Lv  8 Current           7 Term 12/16/21 [redacted]w[redacted]d / 00:09 3230 g M VBAC None  LIV     Birth Comments: WDL  6 Term 10/15/20 [redacted]w[redacted]d 09:25 / 00:09 2801 g F VBAC EPI  LIV  5 Term 06/21/19 [redacted]w[redacted]d 12:50 / 00:09 2895 g F VBAC EPI  LIV  4 AB 2019          3 Term 11/28/16 [redacted]w[redacted]d  3459 g M VBAC EPI  LIV     Birth Comments: no complications  2 Term 05/16/15 [redacted]w[redacted]d  2438 g F CS-LTranv EPI  LIV     Birth  Comments: no complications     Complications: Fetal distress affecting care  1 Term 12/12/12 [redacted]w[redacted]d  3204 g M Vag-Spont EPI N LIV     Birth Comments: no complications    Past Surgical History: Past Surgical History:  Procedure Laterality Date   CESAREAN SECTION     ORIF MANDIBULAR FRACTURE N/A 04/15/2018   Procedure: OPEN REDUCTION INTERNAL FIXATION (ORIF) MANDIBULAR FRACTURE;  Surgeon: Ocie Doyne, DDS;  Location: [facility] OR;  Service: Oral Surgery;  Laterality: N/A;    Family History: Family History  Problem Relation Age of Onset   Hypertension Mother    Hypertension Maternal Grandmother    Asthma Neg Hx    Cancer Neg Hx    Diabetes Neg Hx    Kidney disease Neg Hx    Obesity Neg Hx     Social History: Social History   Tobacco Use   Smoking status: Never   Smokeless tobacco: Never  Vaping Use   Vaping status: Never Used  Substance Use Topics   Alcohol use: Not Currently    Comment: socially    Drug use: Never    Allergies: No Known Allergies  Meds:  Medications Prior to Admission  Medication Sig Dispense Refill Last  Dose   Prenatal Vit-Fe Fumarate-FA (PREPLUS) 27-1 MG TABS Take 1 tablet by mouth daily. (Patient not taking: Reported on 01/15/2023) 30 tablet 6 Not Taking    I have reviewed patient's Past Medical Hx, Surgical Hx, Family Hx, Social Hx, medications and allergies.   ROS:  Review of Systems  Eyes:  Negative for blurred vision.  Cardiovascular:  Negative for chest pain and palpitations.  Neurological:  Negative for headaches.   Other systems negative  Physical Exam  Patient Vitals for the past 24 hrs:  BP Temp Pulse Resp SpO2 Height Weight  01/15/23 2116 (!) 105/59 -- 83 -- -- -- --  01/15/23 2100 132/87 -- -- -- -- -- --  01/15/23 2030 130/78 -- 88 -- -- -- --  01/15/23 2018 133/85 -- 92 -- -- -- --  01/15/23 1956 (!) 146/87 -- -- -- -- -- --  01/15/23 1953 -- 98.5 F (36.9 C) 89 18 100 % 5\' 3"  (1.6 m) 84.4 kg   Constitutional:  Well-developed, well-nourished female in no acute distress.  Cardiovascular: normal rate and rhythm Respiratory: normal effort, clear to auscultation bilaterally GI: Abd soft, non-tender, gravid appropriate for gestational age.   No rebound or guarding. MS: Extremities nontender, no edema, normal ROM Neurologic: Alert and oriented x 4.  GU: Neg CVAT.  PELVIC EXAM: deferred   FHT:  Baseline 140 , moderate variability, accelerations present, no decelerations Contractions: Irregular     Labs: Results for orders placed or performed during the hospital encounter of 01/15/23 (from the past 24 hour(s))  Urinalysis, Routine w reflex microscopic -Urine, Clean Catch     Status: Abnormal   Collection Time: 01/15/23  8:22 PM  Result Value Ref Range   Color, Urine YELLOW YELLOW   APPearance HAZY (A) CLEAR   Specific Gravity, Urine 1.017 1.005 - 1.030   pH 5.0 5.0 - 8.0   Glucose, UA NEGATIVE NEGATIVE mg/dL   Hgb urine dipstick NEGATIVE NEGATIVE   Bilirubin Urine NEGATIVE NEGATIVE   Ketones, ur NEGATIVE NEGATIVE mg/dL   Protein, ur NEGATIVE NEGATIVE mg/dL   Nitrite NEGATIVE NEGATIVE   Leukocytes,Ua SMALL (A) NEGATIVE   RBC / HPF 0-5 0 - 5 RBC/hpf   WBC, UA 0-5 0 - 5 WBC/hpf   Bacteria, UA RARE (A) NONE SEEN   Squamous Epithelial / HPF 11-20 0 - 5 /HPF   Mucus PRESENT   Type and screen [facility]     Status: None (Preliminary result)   Collection Time: 01/15/23  9:19 PM  Result Value Ref Range   ABO/RH(D) PENDING    Antibody Screen PENDING    Sample Expiration      01/18/2023,2359 Performed at [facility], [address], [place], [place] [address]   CBC     Status: Abnormal   Collection Time: 01/15/23  9:55 PM  Result Value Ref Range   WBC 7.7 4.0 - 10.5 K/uL   RBC 4.10 3.87 - 5.11 MIL/uL   Hemoglobin 10.9 (L) 12.0 - 15.0 g/dL   HCT 91.4 (L) 78.2 - 95.6 %   MCV 82.0 80.0 - 100.0 fL   MCH 26.6 26.0 - 34.0 pg   MCHC 32.4 30.0 - 36.0 g/dL   RDW 21.3 08.6 -  57.8 %   Platelets 143 (L) 150 - 400 K/uL   nRBC 0.0 0.0 - 0.2 %    --/--/PENDING (09/10 2119)  Imaging:  No results found.  MAU Course/MDM: I have reviewed the triage vital  signs and the nursing notes.   Pertinent labs & imaging results that were available during my care of the patient were reviewed by me and considered in my medical decision making (see chart for details).      I have reviewed her medical records including past results, notes and treatments.   I have ordered labs and reviewed results. Other results pending NST reviewed Consult Dr Shawnie Pons with presentation, exam findings and test results.  Treatments in MAU included EFM.    Assessment: Single IUP at [redacted]w[redacted]d Gestational hypertension, rule out preeclampsia  Plan: Admit to Labor and Delivery Routine orders IOL  Wynelle Bourgeois CNM, MSN Certified Nurse-Midwife 01/15/2023 10:54 PM

## 2023-01-16 ENCOUNTER — Encounter (HOSPITAL_COMMUNITY): Payer: Self-pay | Admitting: Obstetrics and Gynecology

## 2023-01-16 ENCOUNTER — Inpatient Hospital Stay (HOSPITAL_COMMUNITY): Payer: Medicaid Other | Admitting: Anesthesiology

## 2023-01-16 ENCOUNTER — Other Ambulatory Visit: Payer: Self-pay

## 2023-01-16 ENCOUNTER — Encounter: Payer: Self-pay | Admitting: Family Medicine

## 2023-01-16 DIAGNOSIS — O0933 Supervision of pregnancy with insufficient antenatal care, third trimester: Secondary | ICD-10-CM

## 2023-01-16 DIAGNOSIS — O134 Gestational [pregnancy-induced] hypertension without significant proteinuria, complicating childbirth: Secondary | ICD-10-CM

## 2023-01-16 DIAGNOSIS — O34211 Maternal care for low transverse scar from previous cesarean delivery: Secondary | ICD-10-CM

## 2023-01-16 DIAGNOSIS — Z3A39 39 weeks gestation of pregnancy: Secondary | ICD-10-CM

## 2023-01-16 DIAGNOSIS — O99820 Streptococcus B carrier state complicating pregnancy: Secondary | ICD-10-CM

## 2023-01-16 LAB — CBC
HCT: 34.1 % — ABNORMAL LOW (ref 36.0–46.0)
HCT: 34.3 % — ABNORMAL LOW (ref 36.0–46.0)
Hemoglobin: 10.9 g/dL — ABNORMAL LOW (ref 12.0–15.0)
Hemoglobin: 10.8 g/dL — ABNORMAL LOW (ref 12.0–15.0)
MCH: 26.7 pg (ref 26.0–34.0)
MCH: 26.5 pg (ref 26.0–34.0)
MCHC: 32 g/dL (ref 30.0–36.0)
MCHC: 31.5 g/dL (ref 30.0–36.0)
MCV: 83.4 fL (ref 80.0–100.0)
MCV: 84.3 fL (ref 80.0–100.0)
Platelets: 136 10*3/uL — ABNORMAL LOW (ref 150–400)
Platelets: 132 10*3/uL — ABNORMAL LOW (ref 150–400)
RBC: 4.09 MIL/uL (ref 3.87–5.11)
RBC: 4.07 MIL/uL (ref 3.87–5.11)
RDW: 15.3 % (ref 11.5–15.5)
RDW: 15.4 % (ref 11.5–15.5)
WBC: 6.2 10*3/uL (ref 4.0–10.5)
WBC: 6.1 10*3/uL (ref 4.0–10.5)
nRBC: 0 % (ref 0.0–0.2)
nRBC: 0 % (ref 0.0–0.2)

## 2023-01-16 LAB — RPR: RPR Ser Ql: NONREACTIVE

## 2023-01-16 LAB — PROTEIN / CREATININE RATIO, URINE
Creatinine, Urine: 29 mg/dL
Total Protein, Urine: <6 mg/dL

## 2023-01-16 MED ORDER — EPHEDRINE 5 MG/ML INJ: 10.0000 mg | INTRAVENOUS | Status: DC | PRN

## 2023-01-16 MED ORDER — DIBUCAINE (PERIANAL) 1 % EX OINT: 1.0000 | TOPICAL_OINTMENT | CUTANEOUS | Status: DC | PRN

## 2023-01-16 MED ORDER — TRANEXAMIC ACID-NACL 1000-0.7 MG/100ML-% IV SOLN
INTRAVENOUS | Status: AC
  Filled 2023-01-16: qty 100

## 2023-01-16 MED ORDER — OXYTOCIN-SODIUM CHLORIDE 30-0.9 UT/500ML-% IV SOLN
1.0000 m[IU]/min | INTRAVENOUS | Status: DC
  Administered 2023-01-16: 2 m[IU]/min via INTRAVENOUS
  Filled 2023-01-16: qty 500

## 2023-01-16 MED ORDER — FUROSEMIDE 20 MG PO TABS
40.0000 mg | ORAL_TABLET | Freq: Every day | ORAL | Status: DC
  Filled 2023-01-16: qty 2

## 2023-01-16 MED ORDER — LACTATED RINGERS IV SOLN: 500.0000 mL | Freq: Once | INTRAVENOUS | Status: DC

## 2023-01-16 MED ORDER — FENTANYL-BUPIVACAINE-NACL 0.5-0.125-0.9 MG/250ML-% EP SOLN
12.0000 mL/h | EPIDURAL | Status: DC | PRN
  Administered 2023-01-16: 12 mL/h via EPIDURAL
  Filled 2023-01-16: qty 250

## 2023-01-16 MED ORDER — ONDANSETRON HCL 4 MG/2ML IJ SOLN: 4.0000 mg | INTRAMUSCULAR | Status: DC | PRN

## 2023-01-16 MED ORDER — BENZOCAINE-MENTHOL 20-0.5 % EX AERO: 1.0000 | INHALATION_SPRAY | CUTANEOUS | Status: DC | PRN

## 2023-01-16 MED ORDER — ACETAMINOPHEN 325 MG PO TABS
650.0000 mg | ORAL_TABLET | ORAL | Status: DC | PRN
  Administered 2023-01-17: 650 mg via ORAL
  Filled 2023-01-16: qty 2

## 2023-01-16 MED ORDER — PHENYLEPHRINE 80 MCG/ML (10ML) SYRINGE FOR IV PUSH (FOR BLOOD PRESSURE SUPPORT): 80.0000 ug | PREFILLED_SYRINGE | INTRAVENOUS | Status: DC | PRN

## 2023-01-16 MED ORDER — TERBUTALINE SULFATE 1 MG/ML IJ SOLN: 0.2500 mg | Freq: Once | INTRAMUSCULAR | Status: DC | PRN

## 2023-01-16 MED ORDER — WITCH HAZEL-GLYCERIN EX PADS: 1.0000 | MEDICATED_PAD | CUTANEOUS | Status: DC | PRN

## 2023-01-16 MED ORDER — PRENATAL MULTIVITAMIN CH
1.0000 | ORAL_TABLET | Freq: Every day | ORAL | Status: DC
  Administered 2023-01-17: 1 via ORAL
  Filled 2023-01-16: qty 1

## 2023-01-16 MED ORDER — IBUPROFEN 600 MG PO TABS
600.0000 mg | ORAL_TABLET | Freq: Four times a day (QID) | ORAL | Status: DC
  Administered 2023-01-17 (×3): 600 mg via ORAL
  Filled 2023-01-16 (×3): qty 1

## 2023-01-16 MED ORDER — SENNOSIDES-DOCUSATE SODIUM 8.6-50 MG PO TABS
2.0000 | ORAL_TABLET | Freq: Every day | ORAL | Status: DC
  Administered 2023-01-17: 2 via ORAL
  Filled 2023-01-16: qty 2

## 2023-01-16 MED ORDER — ZOLPIDEM TARTRATE 5 MG PO TABS: 5.0000 mg | ORAL_TABLET | Freq: Every evening | ORAL | Status: DC | PRN

## 2023-01-16 MED ORDER — TETANUS-DIPHTH-ACELL PERTUSSIS 5-2.5-18.5 LF-MCG/0.5 IM SUSY
0.5000 mL | PREFILLED_SYRINGE | Freq: Once | INTRAMUSCULAR | Status: DC
  Filled 2023-01-16: qty 0.5

## 2023-01-16 MED ORDER — DIPHENHYDRAMINE HCL 25 MG PO CAPS: 25.0000 mg | ORAL_CAPSULE | Freq: Four times a day (QID) | ORAL | Status: DC | PRN

## 2023-01-16 MED ORDER — COCONUT OIL OIL: 1.0000 | TOPICAL_OIL | Status: DC | PRN

## 2023-01-16 MED ORDER — ONDANSETRON HCL 4 MG PO TABS: 4.0000 mg | ORAL_TABLET | ORAL | Status: DC | PRN

## 2023-01-16 MED ORDER — TRANEXAMIC ACID-NACL 1000-0.7 MG/100ML-% IV SOLN
1000.0000 mg | INTRAVENOUS | Status: AC
  Administered 2023-01-16: 1000 mg via INTRAVENOUS

## 2023-01-16 MED ORDER — DIPHENHYDRAMINE HCL 50 MG/ML IJ SOLN: 12.5000 mg | INTRAMUSCULAR | Status: DC | PRN

## 2023-01-16 MED ORDER — LIDOCAINE HCL (PF) 1 % IJ SOLN
INTRAMUSCULAR | Status: DC | PRN
  Administered 2023-01-16 (×2): 4 mL via EPIDURAL

## 2023-01-16 MED ORDER — SIMETHICONE 80 MG PO CHEW: 80.0000 mg | CHEWABLE_TABLET | ORAL | Status: DC | PRN

## 2023-01-16 NOTE — H&P (Signed)
Samantha Hood is a 27 y.o. female [phone] presenting for IOL TOLAC 2/2 gHTN. She is observed resting comfortably in bed, reports good FM. Denies contractions, VB, LOF. Denies headache, vision change, SOB, CP, RUQ pain.  OB History     Gravida  8   Para  6   Term  6   Preterm  0   AB  1   Living  6      SAB  0   IAB  0   Ectopic  0   Multiple  0   Live Births  6          Past Medical History:  Diagnosis Date   Anemia    Chlamydia vaginitis/cervicitis 01/13/2015   Hypertension    Medical history non-contributory    Patient denies medical problems    Past Surgical History:  Procedure Laterality Date   CESAREAN SECTION     ORIF MANDIBULAR FRACTURE N/A 04/15/2018   Procedure: OPEN REDUCTION INTERNAL FIXATION (ORIF) MANDIBULAR FRACTURE;  Surgeon: Ocie Doyne, DDS;  Location: [facility] OR;  Service: Oral Surgery;  Laterality: N/A;   Family History: family history includes Hypertension in her maternal grandmother and mother. Social History:  reports that she has never smoked. She has never used smokeless tobacco. She reports that she does not currently use alcohol. She reports that she does not use drugs.     Maternal Diabetes: No Genetic Screening: Not performed Maternal Ultrasounds/Referrals: Normal Fetal Ultrasounds or other Referrals:  None Maternal Substance Abuse:  No Significant Maternal Medications:  None Significant Maternal Lab Results:  Group B Strep positive and Rh negative Number of Prenatal Visits:Less than or equal to 3 verified prenatal visits Other Comments:  None  Review of Systems  Constitutional:  Negative for chills, diaphoresis and fever.  HENT: Negative.    Eyes:  Negative for visual disturbance.  Respiratory:  Negative for cough, chest tightness and shortness of breath.   Cardiovascular:  Negative for chest pain and palpitations.  Gastrointestinal:  Negative for abdominal pain.  Endocrine: Negative.   Genitourinary:  Negative for  vaginal bleeding.  Musculoskeletal:  Negative for arthralgias and myalgias.  Skin: Negative.   Allergic/Immunologic: Negative.   Neurological:  Negative for dizziness, light-headedness and headaches.  Hematological: Negative.   Psychiatric/Behavioral: Negative.     Maternal Medical History:  Reason for admission: IOL 2/2 gHTN.  Contractions: None.  Fetal activity: Perceived fetal activity is normal.   Prenatal complications: PIH and infection (GBS+).   No bleeding.   Prenatal Complications - Diabetes: none.   Dilation: 3 Effacement (%): 30 Station: -3 Exam by:: Dr.Kincade Granberg Blood pressure 133/82, pulse 93, temperature 98.2 F (36.8 C), temperature source Oral, resp. rate 18, height 5\' 3"  (1.6 m), weight 84.4 kg, SpO2 100%, unknown if currently breastfeeding. Maternal Exam:  Uterine Assessment: Contraction strength is mild.  Abdomen: Patient reports no abdominal tenderness. Surgical scars: low transverse.   Fetal presentation: vertex Introitus: Normal vulva. Normal vagina.  Pelvis: adequate for delivery.   Cervix: Cervix evaluated by digital exam.     Physical Exam Genitourinary:    General: Normal vulva.     Prenatal labs: ABO, Rh: --/--/B NEG (09/10 2155) Antibody: POS (09/10 2155) Rubella: 1.46 (06/10 1909) RPR: NON REACTIVE (06/10 1909)  HBsAg: NON REACTIVE (06/10 1909)  HIV: Non Reactive (06/10 1909)  GBS: Positive/-- (08/26 0356)   Assessment/Plan:  Supervision of high risk pregnancy IOL 2/2 gHTN TOLAC Hx of VBAC  -Begin pitocin, progress to AROM following  GBS prophylaxis -Plan for NSVD -Plan for breast and bottle feeding -Plan for circumcision -Undecided for contraception: IUD vs depo  Landry Dyke 01/16/2023, 6:23 AM

## 2023-01-16 NOTE — Progress Notes (Signed)
Pt up to BR

## 2023-01-16 NOTE — Progress Notes (Addendum)
Labor Progress Note Samantha Hood is a 27 y.o. [id number] at [redacted]w[redacted]d presented for TOLAC IOL due to gHTN  S: Samantha Hood is doing well. No complaints. Epidural in place and working well. SROM during epidural placement.   O:  BP 110/73   Pulse 84   Temp 98.2 F (36.8 C) (Oral)   Resp 18   Ht 5\' 3"  (1.6 m)   Wt 84.4 kg   SpO2 99%   BMI 32.95 kg/m    CVE: Dilation: 3.5 Effacement (%): 90 Cervical Position: Posterior Station: -1 Presentation: Vertex Exam by:: stone rnc   A&P: 27 y.o. [id number] [redacted]w[redacted]d who presented for TOLAC IOL due to gHTN #Labor: Progressing well. Fetus did have long deceleration when patient was moved to right side. Pit was stopped at that point. FHR improved after repositioning. Will give 20 minute pitocin break and then restart. #Pain: Epidural in place.  #FWB: Other than single prolonged decel as above, moderate variability, accelerations present. Cat II tracing.  #GBS positive #gHTN last BP 110/73.   Colter Burgess Estelle, MD 1:55 PM  GME ATTESTATION:  Evaluation and management procedures were performed by the [facility] Medicine Resident under my supervision. I was immediately available for direct supervision, assistance and direction throughout this encounter.  I also confirm that I have verified the information documented in the resident's note, and that I have also personally reperformed the pertinent components of the physical exam and all of the medical decision making activities.  I have also made any necessary editorial changes.  Wyn Forster, MD OB Fellow, Faculty Practice [facility], [facility] for [facility] 01/16/2023 2:20 PM

## 2023-01-16 NOTE — Anesthesia Preprocedure Evaluation (Signed)
Anesthesia Evaluation  Patient identified by MRN, date of birth, ID band Patient awake    Reviewed: Allergy & Precautions, NPO status , Patient's Chart, lab work & pertinent test results  History of Anesthesia Complications Negative for: history of anesthetic complications  Airway Mallampati: III  TM Distance: >3 FB Neck ROM: Full    Dental   Pulmonary neg pulmonary ROS   Pulmonary exam normal breath sounds clear to auscultation       Cardiovascular hypertension (gestational),  Rhythm:Regular Rate:Normal     Neuro/Psych negative neurological ROS     GI/Hepatic negative GI ROS, Neg liver ROS,,,  Endo/Other  negative endocrine ROS    Renal/GU negative Renal ROS     Musculoskeletal   Abdominal   Peds  Hematology  (+) Blood dyscrasia (thrombocytopenia), anemia Lab Results      Component                Value               Date                      WBC                      6.2                 01/16/2023                HGB                      10.9 (L)            01/16/2023                HCT                      34.1 (L)            01/16/2023                MCV                      83.4                01/16/2023                PLT                      136 (L)             01/16/2023              Anesthesia Other Findings H/o c-section with successful VBAC  Reproductive/Obstetrics (+) Pregnancy                             Anesthesia Physical Anesthesia Plan  ASA: 2  Anesthesia Plan: Epidural   Post-op Pain Management:    Induction:   PONV Risk Score and Plan:   Airway Management Planned:   Additional Equipment:   Intra-op Plan:   Post-operative Plan:   Informed Consent: I have reviewed the patients History and Physical, chart, labs and discussed the procedure including the risks, benefits and alternatives for the proposed anesthesia with the patient or authorized representative  who has indicated his/her understanding and acceptance.       Plan Discussed with: Anesthesiologist  Anesthesia Plan Comments: (I  have discussed risks of neuraxial anesthesia including but not limited to infection, bleeding, nerve injury, back pain, headache, seizures, and failure of block. Patient denies bleeding disorders and is not currently anticoagulated. Labs have been reviewed. Risks and benefits discussed. All patient's questions answered.  )       Anesthesia Quick Evaluation

## 2023-01-16 NOTE — Plan of Care (Signed)

## 2023-01-16 NOTE — MAU Note (Signed)
Pt informed that the ultrasound is considered a limited OB ultrasound and is not intended to be a complete ultrasound exam.  Patient also informed that the ultrasound is not being completed with the intent of assessing for fetal or placental anomalies or any pelvic abnormalities.  Explained that the purpose of today's ultrasound is to assess for presentation.  Patient acknowledges the purpose of the exam and the limitations of the study.  At time of this scan baby is vertex.

## 2023-01-16 NOTE — Discharge Summary (Signed)
Postpartum Discharge Summary     Patient Name: Samantha Hood DOB: [birth year] MRN: [id number]  Date of admission: 01/15/2023 Delivery date:01/16/2023 Delivering provider: Wyn Forster Date of discharge: 01/17/2023  Admitting diagnosis: Gestational hypertension [O13.9] Intrauterine pregnancy: [redacted]w[redacted]d     Secondary diagnosis:  Principal Problem:   Gestational hypertension Active Problems:   History of VBAC   Previous cesarean section complicating pregnancy, antepartum condition or complication   Rh negative state in antepartum period   Pap smear abnormality of cervix with LGSIL, +HPV   Supervision of high risk pregnancy, antepartum   Unwanted fertility   GBS (group B Streptococcus carrier), +RV culture, currently pregnant   Limited prenatal care, antepartum   Encounter for initial prescription of implantable subdermal contraceptive  Additional problems: n/a    Discharge diagnosis: Term Pregnancy Delivered                                              Post partum procedures: n/a  Augmentation: Pitocin Complications: None  Hospital course: Induction of Labor With Vaginal Delivery   27 y.o. yo [id number] at [redacted]w[redacted]d was admitted to the hospital 01/15/2023 for induction of labor.  Indication for induction: Gestational hypertension.  Patient had an labor course complicated by none. Membrane Rupture Time/Date: 11:43 AM,01/16/2023  Delivery Method:VBAC, Spontaneous Operative Delivery:N/A Episiotomy: None Lacerations:  None Details of delivery can be found in separate delivery note.  Patient had a postpartum course complicated by n/a. Patient is discharged home 01/17/23.  Newborn Data: Birth date:01/16/2023 Birth time:6:47 PM Gender:Female Living status:Living Apgars:9 ,9  Weight:3190 g  Magnesium Sulfate received: No BMZ received: No Rhophylac:Yes MMR:No T-DaP:Given prenatally Flu: No Transfusion:No  Physical exam  Vitals:   01/17/23 0130 01/17/23 0556 01/17/23 0940 01/17/23  1334  BP: 139/75 (!) 110/91 130/82 133/71  Pulse: 85 82 65 63  Resp: 20 18 18 18   Temp: 98 F (36.7 C) 98 F (36.7 C) 98.5 F (36.9 C) 98.5 F (36.9 C)  TempSrc: Oral Oral Oral Oral  SpO2: 98% 100% 100%   Weight:      Height:       General: alert and no distress Lochia: appropriate Uterine Fundus: firm Incision: N/A DVT Evaluation: No evidence of DVT seen on physical exam. Labs: Lab Results  Component Value Date   WBC 6.1 01/16/2023   HGB 10.8 (L) 01/16/2023   HCT 34.3 (L) 01/16/2023   MCV 84.3 01/16/2023   PLT 132 (L) 01/16/2023      Latest Ref Rng & Units 01/15/2023    9:55 PM  CMP  Glucose 70 - 99 mg/dL 81   BUN 6 - 20 mg/dL 6   Creatinine 1.76 - 1.60 mg/dL 7.37   Sodium 106 - 269 mmol/L 132   Potassium 3.5 - 5.1 mmol/L 3.5   Chloride 98 - 111 mmol/L 104   CO2 22 - 32 mmol/L 20   Calcium 8.9 - 10.3 mg/dL 8.2   Total Protein 6.5 - 8.1 g/dL 6.1   Total Bilirubin 0.3 - 1.2 mg/dL 0.4   Alkaline Phos 38 - 126 U/L 190   AST 15 - 41 U/L 20   ALT 0 - 44 U/L 13    Edinburgh Score:    01/17/2023   11:30 AM  Edinburgh Postnatal Depression Scale Screening Tool  I have been able to laugh and see the  funny side of things. 0  I have looked forward with enjoyment to things. 0  I have blamed myself unnecessarily when things went wrong. 1  I have been anxious or worried for no good reason. 0  I have felt scared or panicky for no good reason. 0  Things have been getting on top of me. 0  I have been so unhappy that I have had difficulty sleeping. 0  I have felt sad or miserable. 0  I have been so unhappy that I have been crying. 0  The thought of harming myself has occurred to me. 0  Edinburgh Postnatal Depression Scale Total 1     After visit meds:  Allergies as of 01/17/2023   No Known Allergies      Medication List     TAKE these medications    furosemide 40 MG tablet Commonly known as: LASIX Take 1 tablet (40 mg total) by mouth daily. Start taking on:  January 18, 2023   PrePLUS 27-1 MG Tabs Take 1 tablet by mouth daily.   senna-docusate 8.6-50 MG tablet Commonly known as: Senokot-S Take 2 tablets by mouth daily. Start taking on: January 18, 2023         Discharge home in stable condition Infant Feeding: Bottle Infant Disposition:home with mother Discharge instruction: per After Visit Summary and Postpartum booklet. Activity: Advance as tolerated. Pelvic rest for 6 weeks.  Diet: routine diet Future Appointments: Future Appointments  Date Time Provider Department Center  01/22/2023  8:15 AM Carlynn Herald, CNM [facility]   Follow up Visit:  Message sent to [facility] 9/11  Please schedule this patient for a In person postpartum visit in 6 weeks with the following provider: Any provider. Additional Postpartum F/U:BP check 1 week, patient refused to take lasix on day of d/c, instructed risks and importance of compliance  High risk pregnancy complicated by: HTN Delivery mode:  VBAC, Spontaneous Anticipated Birth Control: Received Nexplanon LUE prior to leaving   01/17/2023 Hessie Dibble, MD

## 2023-01-16 NOTE — Anesthesia Procedure Notes (Signed)
Epidural Patient location during procedure: OB Start time: 01/16/2023 11:30 AM End time: 01/16/2023 11:35 AM  Staffing Anesthesiologist: Linton Rump, MD Performed: anesthesiologist   Preanesthetic Checklist Completed: patient identified, IV checked, site marked, risks and benefits discussed, surgical consent, monitors and equipment checked, pre-op evaluation and timeout performed  Epidural Patient position: sitting Prep: DuraPrep and site prepped and draped Patient monitoring: continuous pulse ox and blood pressure Approach: midline Location: L3-L4 Injection technique: LOR saline  Needle:  Needle type: Tuohy  Needle gauge: 17 G Needle length: 9 cm and 9 Needle insertion depth: 6 cm Catheter type: closed end flexible Catheter size: 19 Gauge Catheter at skin depth: 10 cm Test dose: negative  Assessment Events: blood not aspirated, no cerebrospinal fluid, injection not painful, no injection resistance, no paresthesia and negative IV test  Additional Notes The patient has requested an epidural for labor pain management. Risks and benefits including, but not limited to, infection, bleeding, local anesthetic toxicity, headache, hypotension, back pain, block failure, etc. were discussed with the patient. The patient expressed understanding and consented to the procedure. I confirmed that the patient has no bleeding disorders and is not taking blood thinners. I confirmed the patient's last platelet count with the nurse. A time-out was performed immediately prior to the procedure. Please see nursing documentation for vital signs. Sterile technique was used throughout the whole procedure. Once LOR achieved, the epidural catheter threaded easily without resistance. Aspiration of the catheter was negative for blood and CSF. The epidural was dosed slowly and an infusion was started.  1 attempt(s)Reason for block:procedure for pain

## 2023-01-16 NOTE — Progress Notes (Signed)
   Subjective:  Samantha Hood is a 27 y.o. [id number] at [redacted]w[redacted]d being seen today for ongoing prenatal care.  She is currently monitored for the following issues for this high-risk pregnancy and has History of VBAC; Previous cesarean section complicating pregnancy, antepartum condition or complication; Rh negative state in antepartum period; Pap smear abnormality of cervix with LGSIL, +HPV; History of gestational hypertension; Supervision of high risk pregnancy, antepartum; Unwanted fertility; GBS (group B Streptococcus carrier), +RV culture, currently pregnant; Limited prenatal care, antepartum; and Gestational hypertension on their problem list.  Patient reports no complaints.  Contractions: Irritability. Vag. Bleeding: None.  Movement: Present. Denies leaking of fluid.   The following portions of the patient's history were reviewed and updated as appropriate: allergies, current medications, past family history, past medical history, past social history, past surgical history and problem list. Problem list updated.  Objective:   Vitals:   01/15/23 1609 01/15/23 1626 01/15/23 1627  BP: (!) 160/105 (!) 153/96 (!) 156/107  Pulse: (!) 139 (!) 116 (!) 116  Weight: 185 lb 6.4 oz (84.1 kg)      Fetal Status: Fetal Heart Rate (bpm): 127   Movement: Present     General:  Alert, oriented and cooperative. Patient is in no acute distress.  Skin: Skin is warm and dry. No rash noted.   Cardiovascular: Normal heart rate noted  Respiratory: Normal respiratory effort, no problems with respiration noted  Abdomen: Soft, gravid, appropriate for gestational age. Pain/Pressure: Present     Pelvic: Vag. Bleeding: None     Cervical exam deferred        Extremities: Normal range of motion.  Edema: None  Mental Status: Normal mood and affect. Normal behavior. Normal judgment and thought content.   Urinalysis:      Assessment and Plan:  Pregnancy: [id number] at [redacted]w[redacted]d  1. Supervision of high risk pregnancy,  antepartum BP initially severe range, rechecks all mild range and on the border of severe FHR normal Discussed with patient given her hx of gestational hypertension in prior pregnancies I recommend proceeding to L&D for delivery Spoke with Dr. Ashok Pall who was in agreement, given current census she will go to MAU first  2. Unwanted fertility Desires BTL, form signed on 12/31/22, will need to be interval  3. Rh negative state in antepartum period Did not receive rhogam at usual time, given dose on 12/31/2022  4. Previous cesarean section complicating pregnancy, antepartum condition or complication NSVD>CS>VBAC x4 Previously signed TOLAC consent  5. Limited prenatal care, antepartum Initiated prenatal care at 37 weeks  6. History of VBAC   7. History of gestational hypertension   8. GBS (group B Streptococcus carrier), +RV culture, currently pregnant Ppx in labor  Term labor symptoms and general obstetric precautions including but not limited to vaginal bleeding, contractions, leaking of fluid and fetal movement were reviewed in detail with the patient. Please refer to After Visit Summary for other counseling recommendations.  Return in 6 weeks (on 02/26/2023) for PP check.   Venora Maples, MD

## 2023-01-17 ENCOUNTER — Encounter (HOSPITAL_COMMUNITY): Payer: Self-pay | Admitting: Obstetrics and Gynecology

## 2023-01-17 DIAGNOSIS — Z30017 Encounter for initial prescription of implantable subdermal contraceptive: Secondary | ICD-10-CM

## 2023-01-17 HISTORY — PX: SUBDERMAL ETONOGESTREL IMPLANT INSERTION: PRO7525

## 2023-01-17 LAB — HEMOGLOBIN A1C
Hgb A1c MFr Bld: 5.5 % (ref 4.8–5.6)
Mean Plasma Glucose: 111 mg/dL

## 2023-01-17 MED ORDER — RHO D IMMUNE GLOBULIN 1500 UNIT/2ML IJ SOSY
300.0000 ug | PREFILLED_SYRINGE | Freq: Once | INTRAMUSCULAR | Status: AC
  Administered 2023-01-17: 300 ug via INTRAMUSCULAR
  Filled 2023-01-17: qty 2

## 2023-01-17 MED ORDER — SENNOSIDES-DOCUSATE SODIUM 8.6-50 MG PO TABS: 2.0000 | ORAL_TABLET | Freq: Every day | ORAL | 0 refills | Status: DC

## 2023-01-17 MED ORDER — LIDOCAINE HCL 1 % IJ SOLN
0.0000 mL | Freq: Once | INTRAMUSCULAR | Status: AC | PRN
  Administered 2023-01-17: 10 mL via INTRADERMAL
  Filled 2023-01-17: qty 20

## 2023-01-17 MED ORDER — ETONOGESTREL 68 MG ~~LOC~~ IMPL
68.0000 mg | DRUG_IMPLANT | Freq: Once | SUBCUTANEOUS | Status: AC
  Administered 2023-01-17: 68 mg via SUBCUTANEOUS
  Filled 2023-01-17: qty 1

## 2023-01-17 MED ORDER — FUROSEMIDE 40 MG PO TABS: 40.0000 mg | ORAL_TABLET | Freq: Every day | ORAL | 0 refills | Status: DC

## 2023-01-17 MED ORDER — RHO D IMMUNE GLOBULIN 1500 UNIT/2ML IJ SOSY
300.0000 ug | PREFILLED_SYRINGE | Freq: Once | INTRAMUSCULAR | Status: DC
  Filled 2023-01-17: qty 2

## 2023-01-17 NOTE — Procedures (Signed)
Nexplanon Insertion PROCEDURE NOTE Samantha Hood is a 27 y.o. [id number] here for Nexplanon insertion. Last pap smear was on 06/21/20 LSIL, positive HR HPV.  No other gynecologic concerns.  Patient was given informed consent, she signed consent form.  Patient does understand that irregular bleeding is a very common side effect of this medication. She was advised to have backup contraception for one week after placement. Pregnancy test in clinic today was negative.  Appropriate time out taken.  Patient's left arm was prepped and draped in the usual sterile fashion.. The ruler used to measure and mark insertion area.  Patient was prepped with alcohol swab and then injected with 3 ml of 1% lidocaine.  She was prepped with betadine, Nexplanon removed from packaging,  Device confirmed in needle, then inserted full length of needle and withdrawn per handbook instructions. Nexplanon was able to palpated in the patient's arm; patient palpated the insert herself. There was minimal blood loss.  Patient insertion site covered with guaze and a pressure bandage to reduce any bruising.  The patient tolerated the procedure well and was given post procedure instructions.   Hessie Dibble 01/17/2023 5:45 PM

## 2023-01-17 NOTE — Lactation Note (Signed)
This note was copied from a baby's chart. Lactation Consultation Note Mom chooses to formula feed.  Patient Name: Samantha Hood FIEPP'I Date: 01/17/2023 Age:27 hours     Maternal Data    Feeding Nipple Type: Slow - flow  LATCH Score                    Lactation Tools Discussed/Used    Interventions    Discharge    Consult Status Consult Status: Complete    Takumi Din G 01/17/2023, 1:46 AM

## 2023-01-17 NOTE — Discharge Instructions (Addendum)
Important to take your Lasix medication as most risk of blood pressure problems are between day 3 and 5.  However, please take the medication each day up to a week after delivery.    In person postpartum visit in 6 weeks with the following provider: Any provider.   Additional Postpartum F/U:BP check 1 week,

## 2023-01-17 NOTE — Progress Notes (Signed)
Provided discharge instruction with the the patient.  Patient verbalize understanding.  No further question at this time.

## 2023-01-17 NOTE — Clinical Social Work Maternal (Signed)
CLINICAL SOCIAL WORK MATERNAL/CHILD NOTE  Patient Details  Name: Samantha Hood MRN: [id number] Date of Birth: March 25, 1996  Date:  01/17/2023  Clinical Social Worker Initiating Note:  Enos Fling Date/Time: Initiated:  01/17/23/1519     Child's Name:  Samantha Hood   Biological Parents:  Mother, Father Samantha Hood 06-25-1995  Samantha Hood  04-13-1995)   Need for Interpreter:  None   Reason for Referral:  Late or No Prenatal Care     Address:  [address] [place] [address]    Phone number:  [phone] (home)     Additional phone number:   Household Members/Support Persons (HM/SP):   Household Member/Support Person 1, Household Member/Support Person 2, Household Member/Support Person 3, Household Member/Support Person 4, Household Member/Support Person 5, Household Member/Support Person 6, Household Member/Support Person 7   HM/SP Name Relationship DOB or Age  HM/SP -1 Samantha Hood MOB's son 12-12-2012  HM/SP -2 Samantha Hood daughter 05-16-2015  HM/SP -3 Samantha Hood son 11-28-2016  HM/SP -4 Samantha Hood daughter 06-21-2019  HM/SP -5 Samantha Hood daughter 10-15-2020  HM/SP -6 Samantha Hood son 12-16-2021  HM/SP -7 Samantha Hood FOB 12-16-2021  HM/SP -8          Natural Supports (not living in the home):  Immediate Family, Spouse/significant other   Professional Supports: None   Employment: Unemployed   Type of Work:     Education:  Engineer, agricultural   Homebound arranged:    Surveyor, quantity Resources:  Medicaid   Other Resources:  Sales executive  , [employer]   Cultural/Religious Considerations Which May Impact Care:    Strengths:  Ability to meet basic needs  , Merchandiser, retail, Home prepared for child     Psychotropic Medications:         Pediatrician:    Armed forces operational officer area  Optometrist List:   Public librarian Child Health)  [place]    [place]       Pediatrician Fax Number:    Risk Factors/Current Problems:   (Late/Limited prenatal care)   Cognitive State:  Able to Concentrate  , Alert  , Goal Oriented  , Insightful  , Linear Thinking     Mood/Affect:  Interested  , Comfortable  , Relaxed  , Calm     CSW Assessment: CSW received a consult for Limited/ No Prenatal Care and met MOB at bedside to complete a full psychosocial assessment. CSW entered the room, introduced herself and acknowledged that guest were present. MOB gave CSW verbal permission to speak about anything while her guest was present. CSW explained her role and the reason for the visit. MOB presented as calm, was agreeable to consult and remained engaged throughout encounter.  CSW collected MOB's demographic information and inquired about her mental health history. MOB denied any mental health history and PPD. CSW provided education regarding the baby blues period vs. perinatal mood disorders, discussed treatment and gave resources for mental health follow up if concerns arise.  CSW recommends self-evaluation during the postpartum time period using the New Mom Checklist from Postpartum Progress and encouraged MOB to contact a medical professional if symptoms are noted at any time.  CSW assessed for safety with MOB SI/HI/DV;MOB denied all.  CSW asked MOB does she receive support resources; MOB said yes(WIC and food stamps). MOB reported having all essential items for the infant including a carseat, bassinet and  crib for safe sleeping. CSW provided review of Sudden Infant Death Syndrome (SIDS) precautions.  CSW asked MOB about the barriers she experienced with obtaining prenatal care. MOB reported she didn't know she pregnant, then considered an abortion; however she was too far along for the abortion to be completed once she decided to keep the infant. MOB reported beginning to locate a doctor; however the appointments were far into the future and eventually begin to have  transportation issues. CSW informed MOB due to the limited/no prenatal care during pregnancy the hospital will perform a UDS and CDS on the infant. If the results are positive a CPS report will be made; MOB was understanding. MOB denied any substance use history and the use of illicit substances.  Infant's urine results were negative and the CSW will continue to monitor the umbilical cord.  CSW Plan/Description:   No Further Intervention Required/No Barriers to Discharge, Sudden Infant Death Syndrome (SIDS) Education, Perinatal Mood and Anxiety Disorder (PMADs) Education, Hospital Drug Screen Policy Information, Other Information/Referral to [facility], CSW Will Continue to Monitor Umbilical Cord Tissue Drug Screen Results and Make Report if Samantha Coddington, LCSW 01/17/2023, 3:55 PM

## 2023-01-18 LAB — RH IG WORKUP (INCLUDES ABO/RH)
Fetal Screen: NEGATIVE
Gestational Age(Wks): 39
Unit division: 0

## 2023-01-18 NOTE — Anesthesia Postprocedure Evaluation (Signed)
Anesthesia Post Note  Patient: Samantha Hood  Procedure(s) Performed: AN AD HOC LABOR EPIDURAL     Patient location during evaluation: Mother Baby Anesthesia Type: Epidural Level of consciousness: awake and alert Pain management: pain level controlled Vital Signs Assessment: post-procedure vital signs reviewed and stable Respiratory status: spontaneous breathing, nonlabored ventilation and respiratory function stable Cardiovascular status: stable Postop Assessment: no headache, no backache and epidural receding Anesthetic complications: no   No notable events documented.  Last Vitals: There were no vitals filed for this visit.  Last Pain: There were no vitals filed for this visit.               Tamura Lasky

## 2023-01-19 LAB — TYPE AND SCREEN
ABO/RH(D): B NEG
Antibody Screen: POSITIVE
Unit division: 0
Unit division: 0

## 2023-01-19 LAB — BPAM RBC
Blood Product Expiration Date: 202410102359
Blood Product Expiration Date: 202410162359
Unit Type and Rh: 1700
Unit Type and Rh: 1700

## 2023-01-22 ENCOUNTER — Encounter: Payer: Self-pay | Admitting: Family Medicine

## 2023-01-22 ENCOUNTER — Encounter: Payer: Medicaid Other | Admitting: Certified Nurse Midwife

## 2023-01-22 DIAGNOSIS — Z3009 Encounter for other general counseling and advice on contraception: Secondary | ICD-10-CM

## 2023-02-14 ENCOUNTER — Telehealth (HOSPITAL_COMMUNITY): Payer: Self-pay | Admitting: *Deleted

## 2023-02-14 NOTE — Telephone Encounter (Signed)
02/14/2023  Name: Samantha Hood MRN: [id number] DOB: [birth year]  Reason for Call:  Transition of Care Hospital Discharge Call  Contact Status: Patient Contact Status: Complete  Language assistant needed:          Follow-Up Questions: Do You Have Any Concerns About Your Health As You Heal From Delivery?: No Do You Have Any Concerns About Your Infants Health?: No  Edinburgh Postnatal Depression Scale:  In the Past 7 Days:   EPDS not done at this time. Patient reported that she completed EPDS with home visiting nurse. No areas of concern identified. Patient stated, "I'm doing well." PHQ2-9 Depression Scale:     Discharge Follow-up: Edinburgh score requires follow up?: N/A Patient was advised of the following resources:: Breastfeeding Support Group, Support Group  Post-discharge interventions: Reviewed Newborn Safe Sleep Practices  Signature Deforest Hoyles, RN, 02/14/23, [id number]

## 2023-02-20 ENCOUNTER — Ambulatory Visit: Payer: Medicaid Other | Admitting: Obstetrics and Gynecology

## 2023-04-26 ENCOUNTER — Ambulatory Visit: Payer: Medicaid Other | Admitting: Obstetrics and Gynecology

## 2023-05-30 ENCOUNTER — Ambulatory Visit: Payer: Medicaid Other | Admitting: Obstetrics and Gynecology

## 2023-06-19 ENCOUNTER — Ambulatory Visit: Payer: Medicaid Other

## 2023-06-25 ENCOUNTER — Ambulatory Visit: Payer: Medicaid Other | Admitting: Obstetrics and Gynecology

## 2023-07-02 ENCOUNTER — Telehealth: Payer: Medicaid Other | Admitting: Nurse Practitioner

## 2023-07-02 DIAGNOSIS — K047 Periapical abscess without sinus: Secondary | ICD-10-CM

## 2023-07-02 MED ORDER — AMOXICILLIN-POT CLAVULANATE 875-125 MG PO TABS
1.0000 | ORAL_TABLET | Freq: Two times a day (BID) | ORAL | 0 refills | Status: AC
Start: 2023-07-02 — End: 2023-07-09

## 2023-07-02 NOTE — Progress Notes (Signed)
 Virtual Visit Consent   [name], you are scheduled for a virtual visit with a [facility] provider today. Just as with appointments in the office, your consent must be obtained to participate. Your consent will be active for this visit and any virtual visit you may have with one of our providers in the next 365 days. If you have a MyChart account, a copy of this consent can be sent to you electronically.  As this is a virtual visit, video technology does not allow for your provider to perform a traditional examination. This may limit your provider's ability to fully assess your condition. If your provider identifies any concerns that need to be evaluated in person or the need to arrange testing (such as labs, EKG, etc.), we will make arrangements to do so. Although advances in technology are sophisticated, we cannot ensure that it will always work on either your end or our end. If the connection with a video visit is poor, the visit may have to be switched to a telephone visit. With either a video or telephone visit, we are not always able to ensure that we have a secure connection.  By engaging in this virtual visit, you consent to the provision of healthcare and authorize for your insurance to be billed (if applicable) for the services provided during this visit. Depending on your insurance coverage, you may receive a charge related to this service.  I need to obtain your verbal consent now. Are you willing to proceed with your visit today? Samantha Hood has provided verbal consent on 07/02/2023 for a virtual visit (video or telephone). Viviano Simas, FNP  Date: 07/02/2023 5:08 PM   Virtual Visit via Video Note   I, Viviano Simas, connected with  Samantha Hood  ([id number], 1995/07/05) on 07/02/23 at  5:15 PM EST by a video-enabled telemedicine application and verified that I am speaking with the correct person using two identifiers.  Location: Patient: Virtual Visit Location Patient:  Home Provider: Virtual Visit Location Provider: Home Office   I discussed the limitations of evaluation and management by telemedicine and the availability of in person appointments. The patient expressed understanding and agreed to proceed.    History of Present Illness: Samantha Hood is a 28 y.o. who identifies as a female who was assigned female at birth, and is being seen today for an infected tooth.   She feels that her wisdom teeth are coming in and swollen  She feels this on the top and bottom right side   She has not had any of her wisdom teeth removed, but has been told that she will need them extracted due to crowding   She has had an oral surgery consultation   Denies a chance of pregnancy not breastfeeding   Problems:  Patient Active Problem List   Diagnosis Date Noted   Encounter for initial prescription of implantable subdermal contraceptive 01/17/2023   Gestational hypertension 01/15/2023   Limited prenatal care, antepartum 01/08/2023   GBS (group B Streptococcus carrier), +RV culture, currently pregnant 01/04/2023   Supervision of high risk pregnancy, antepartum 12/31/2022   Unwanted fertility 12/31/2022   History of gestational hypertension 10/14/2020   Pap smear abnormality of cervix with LGSIL, +HPV 06/25/2020   Rh negative state in antepartum period 06/22/2020   Previous cesarean section complicating pregnancy, antepartum condition or complication 02/18/2019   History of VBAC 07/16/2016    Allergies: No Known Allergies Medications:  Current Outpatient Medications:    furosemide (  LASIX) 40 MG tablet, Take 1 tablet (40 mg total) by mouth daily., Disp: 7 tablet, Rfl: 0   Prenatal Vit-Fe Fumarate-FA (PREPLUS) 27-1 MG TABS, Take 1 tablet by mouth daily. (Patient not taking: Reported on 01/15/2023), Disp: 30 tablet, Rfl: 6   senna-docusate (SENOKOT-S) 8.6-50 MG tablet, Take 2 tablets by mouth daily., Disp: 30 tablet, Rfl: 0  Observations/Objective: Patient is  well-developed, well-nourished in no acute distress.  Resting comfortably  at home.  Head is normocephalic, atraumatic.  No labored breathing.  Speech is clear and coherent with logical content.  Patient is alert and oriented at baseline.    Assessment and Plan:  1. Dental infection (Primary)  Advised follow up with dentist for evaluation for extraction   - amoxicillin-clavulanate (AUGMENTIN) 875-125 MG tablet; Take 1 tablet by mouth 2 (two) times daily for 7 days. Take with food  Dispense: 14 tablet; Refill: 0     Follow Up Instructions: I discussed the assessment and treatment plan with the patient. The patient was provided an opportunity to ask questions and all were answered. The patient agreed with the plan and demonstrated an understanding of the instructions.  A copy of instructions were sent to the patient via MyChart unless otherwise noted below.    The patient was advised to call back or seek an in-person evaluation if the symptoms worsen or if the condition fails to improve as anticipated.

## 2023-07-12 ENCOUNTER — Telehealth: Admitting: Physician Assistant

## 2023-07-12 DIAGNOSIS — R109 Unspecified abdominal pain: Secondary | ICD-10-CM

## 2023-07-12 NOTE — Progress Notes (Signed)
 Virtual Visit Consent   Samantha Hood, you are scheduled for a virtual visit with a [facility] provider today. Just as with appointments in the office, your consent must be obtained to participate. Your consent will be active for this visit and any virtual visit you may have with one of our providers in the next 365 days. If you have a MyChart account, a copy of this consent can be sent to you electronically.  As this is a virtual visit, video technology does not allow for your provider to perform a traditional examination. This may limit your provider's ability to fully assess your condition. If your provider identifies any concerns that need to be evaluated in person or the need to arrange testing (such as labs, EKG, etc.), we will make arrangements to do so. Although advances in technology are sophisticated, we cannot ensure that it will always work on either your end or our end. If the connection with a video visit is poor, the visit may have to be switched to a telephone visit. With either a video or telephone visit, we are not always able to ensure that we have a secure connection.  By engaging in this virtual visit, you consent to the provision of healthcare and authorize for your insurance to be billed (if applicable) for the services provided during this visit. Depending on your insurance coverage, you may receive a charge related to this service.  I need to obtain your verbal consent now. Are you willing to proceed with your visit today? Samantha Hood has provided verbal consent on 07/12/2023 for a virtual visit (video or telephone). Tylene Fantasia Ward, PA-C  Date: 07/12/2023 6:33 PM   Virtual Visit via Video Note   I, Tylene Fantasia Ward, connected with  Samantha Hood  ([id number], October 20, 1995) on 07/12/23 at  6:45 PM EST by a video-enabled telemedicine application and verified that I am speaking with the correct person using two identifiers.  Location: Patient: Virtual Visit Location Patient:  Home Provider: Virtual Visit Location Provider: Home Office   I discussed the limitations of evaluation and management by telemedicine and the availability of in person appointments. The patient expressed understanding and agreed to proceed.    History of Present Illness: Samantha Hood is a 28 y.o. who identifies as a female who was assigned female at birth, and is being seen today for right side pain.  Pt is worried she may have a kidney infection.  She was recently prescribed an antibiotic for a tooth infection, amoxicillin, but was worried about starting this if she did have a kidney infection.  Denies n/v, dysuria, trouble urinating, hematuria, abdominal pain, flank pain, fever, chills.  HPI: HPI  Problems:  Patient Active Problem List   Diagnosis Date Noted   Encounter for initial prescription of implantable subdermal contraceptive 01/17/2023   Gestational hypertension 01/15/2023   Limited prenatal care, antepartum 01/08/2023   GBS (group B Streptococcus carrier), +RV culture, currently pregnant 01/04/2023   Supervision of high risk pregnancy, antepartum 12/31/2022   Unwanted fertility 12/31/2022   History of gestational hypertension 10/14/2020   Pap smear abnormality of cervix with LGSIL, +HPV 06/25/2020   Rh negative state in antepartum period 06/22/2020   Previous cesarean section complicating pregnancy, antepartum condition or complication 02/18/2019   History of VBAC 07/16/2016    Allergies: No Known Allergies Medications:  Current Outpatient Medications:    furosemide (LASIX) 40 MG tablet, Take 1 tablet (40 mg total) by mouth daily., Disp: 7  tablet, Rfl: 0   Prenatal Vit-Fe Fumarate-FA (PREPLUS) 27-1 MG TABS, Take 1 tablet by mouth daily. (Patient not taking: Reported on 01/15/2023), Disp: 30 tablet, Rfl: 6   senna-docusate (SENOKOT-S) 8.6-50 MG tablet, Take 2 tablets by mouth daily., Disp: 30 tablet, Rfl: 0  Observations/Objective: Patient is well-developed,  well-nourished in no acute distress.  Resting comfortably  at home.  Head is normocephalic, atraumatic.  No labored breathing.  Speech is clear and coherent with logical content.  Patient is alert and oriented at baseline.    Assessment and Plan: 1. Side pain (Primary)  Pain is unlikely UTI or kidney infection, possibly musculoskeletal.  Advised to start the amoxicillin that was prescribed for tooth infection.   Follow Up Instructions: I discussed the assessment and treatment plan with the patient. The patient was provided an opportunity to ask questions and all were answered. The patient agreed with the plan and demonstrated an understanding of the instructions.  A copy of instructions were sent to the patient via MyChart unless otherwise noted below.     The patient was advised to call back or seek an in-person evaluation if the symptoms worsen or if the condition fails to improve as anticipated.    Tylene Fantasia Ward, PA-C

## 2023-07-12 NOTE — Patient Instructions (Signed)
  Samantha Hood, thank you for joining Tylene Fantasia Ward, PA-C for today's virtual visit.  While this provider is not your primary care provider (PCP), if your PCP is located in our provider database this encounter information will be shared with them immediately following your visit.   A [facility] MyChart account gives you access to today's visit and all your visits, tests, and labs performed at [facility] " click here if you don't have a [facility] MyChart account or go to [email or web address]  Consent: (Patient) Samantha Hood provided verbal consent for this virtual visit at the beginning of the encounter.  Current Medications:  Current Outpatient Medications:    furosemide (LASIX) 40 MG tablet, Take 1 tablet (40 mg total) by mouth daily., Disp: 7 tablet, Rfl: 0   Prenatal Vit-Fe Fumarate-FA (PREPLUS) 27-1 MG TABS, Take 1 tablet by mouth daily. (Patient not taking: Reported on 01/15/2023), Disp: 30 tablet, Rfl: 6   senna-docusate (SENOKOT-S) 8.6-50 MG tablet, Take 2 tablets by mouth daily., Disp: 30 tablet, Rfl: 0   Medications ordered in this encounter:  No orders of the defined types were placed in this encounter.    *If you need refills on other medications prior to your next appointment, please contact your pharmacy*  Follow-Up: Call back or seek an in-person evaluation if the symptoms worsen or if the condition fails to improve as anticipated.  [facility] [phone]  Other Instructions  It does not sound like you have a kidney infection at this time.  If you develop back pain, pain with urination, fever, chills recommend in person evaluation.   If you have been instructed to have an in-person evaluation today at a local Urgent Care facility, please use the link below. It will take you to a list of all of our available [facility], including address, phone number and hours of operation. Please do not delay care.  [facility]  If you or a family member do not have a primary care provider, use the link below to schedule a visit and establish care. When you choose a [facility] primary care physician or advanced practice provider, you gain a long-term partner in health. Find a Primary Care Provider  Learn more about [facility]'s in-office and virtual care options: [facility] - Get Care Now

## 2023-07-25 ENCOUNTER — Encounter: Payer: Self-pay | Admitting: Obstetrics and Gynecology

## 2023-08-06 ENCOUNTER — Telehealth: Admitting: Family Medicine

## 2023-08-06 DIAGNOSIS — M79601 Pain in right arm: Secondary | ICD-10-CM | POA: Diagnosis not present

## 2023-08-06 DIAGNOSIS — M542 Cervicalgia: Secondary | ICD-10-CM

## 2023-08-06 MED ORDER — IBUPROFEN 600 MG PO TABS
600.0000 mg | ORAL_TABLET | Freq: Three times a day (TID) | ORAL | 0 refills | Status: DC | PRN
Start: 2023-08-06 — End: 2023-11-14

## 2023-08-06 MED ORDER — CYCLOBENZAPRINE HCL 5 MG PO TABS
5.0000 mg | ORAL_TABLET | Freq: Two times a day (BID) | ORAL | 0 refills | Status: DC | PRN
Start: 2023-08-06 — End: 2023-11-14

## 2023-08-06 NOTE — Patient Instructions (Addendum)
 [name], thank you for joining Freddy Finner, NP for today's virtual visit.  While this provider is not your primary care provider (PCP), if your PCP is located in our provider database this encounter information will be shared with them immediately following your visit.   A [facility] MyChart account gives you access to today's visit and all your visits, tests, and labs performed at [facility] " click here if you don't have a [facility] MyChart account or go to [email or web address]  Consent: (Patient) Samantha Hood provided verbal consent for this virtual visit at the beginning of the encounter.  Current Medications:  Current Outpatient Medications:    cyclobenzaprine (FLEXERIL) 5 MG tablet, Take 1 tablet (5 mg total) by mouth 2 (two) times daily as needed for muscle spasms., Disp: 30 tablet, Rfl: 0   ibuprofen (ADVIL) 600 MG tablet, Take 1 tablet (600 mg total) by mouth every 8 (eight) hours as needed., Disp: 30 tablet, Rfl: 0   furosemide (LASIX) 40 MG tablet, Take 1 tablet (40 mg total) by mouth daily., Disp: 7 tablet, Rfl: 0   Prenatal Vit-Fe Fumarate-FA (PREPLUS) 27-1 MG TABS, Take 1 tablet by mouth daily. (Patient not taking: Reported on 01/15/2023), Disp: 30 tablet, Rfl: 6   senna-docusate (SENOKOT-S) 8.6-50 MG tablet, Take 2 tablets by mouth daily., Disp: 30 tablet, Rfl: 0   Medications ordered in this encounter:  Meds ordered this encounter  Medications   ibuprofen (ADVIL) 600 MG tablet    Sig: Take 1 tablet (600 mg total) by mouth every 8 (eight) hours as needed.    Dispense:  30 tablet    Refill:  0    Supervising Provider:   Merrilee Jansky [[id number]]   cyclobenzaprine (FLEXERIL) 5 MG tablet    Sig: Take 1 tablet (5 mg total) by mouth 2 (two) times daily as needed for muscle spasms.    Dispense:  30 tablet    Refill:  0    Supervising Provider:   Merrilee Jansky [[id number]]     *If you need refills on other medications prior to your next  appointment, please contact your pharmacy*  Follow-Up: Call back or seek an in-person evaluation if the symptoms worsen or if the condition fails to improve as anticipated.  [facility] [phone]  Other Instructions Muscle Strain A muscle strain, or pulled muscle, happens when a muscle is stretched beyond its normal length. This can tear some muscle fibers and cause pain. Usually, it takes 1-2 weeks to heal from a muscle strain. Full healing normally takes 5-6 weeks. What are the causes? This condition is caused when a sudden force is placed on a muscle and stretches it too far. This can happen with a fall, while lifting, or during sports. What increases the risk? You are more likely to develop a muscle strain if you are an athlete or you do a lot of physical activity. What are the signs or symptoms? Pain. Tenderness. Bruising. Swelling. Trouble using the muscle. How is this treated? This condition is first treated with PRICE therapy. This involves: Protecting your muscle from being injured again. Resting your injured muscle. Icing your injured muscle. Putting pressure (compression) on your injured muscle. This may be done with a splint or elastic bandage. Raising (elevating) your injured muscle. Your doctor may also recommend medicine for pain. Follow these instructions at home: If you have a splint that can be taken off: Wear the splint as told by your  doctor. Take it off only as told by your doctor. Check the skin around the splint every day. Tell your doctor if you see problems. Loosen the splint if your fingers or toes: Tingle. Become numb. Turn cold and blue. Keep the splint clean. If the splint is not waterproof: Do not let it get wet. Cover it with a watertight covering when you take a bath or a shower. Managing pain, stiffness, and swelling  If told, put ice on your injured area. To do this: If you have a removable splint, take it off as told by  your doctor. Put ice in a plastic bag. Place a towel between your skin and the bag. Leave the ice on for 20 minutes, 2-3 times a day. Take off the ice if your skin turns bright red. This is very important. If you cannot feel pain, heat, or cold, you have a greater risk of damage to the area. Move your fingers or toes often. Raise the injured area above the level of your heart while you are sitting or lying down. Wear an elastic bandage as told by your doctor. Make sure it is not too tight. General instructions Take over-the-counter and prescription medicines only as told by your doctor. This may include: Medicines for pain and swelling that are taken by mouth or put on the skin. Medicines to help relax your muscles. Limit your activity. Rest your injured muscle as told by your doctor. Your doctor may say that gentle movements are okay. If physical therapy was prescribed, do exercises as told by your doctor. Do not put pressure on any part of the splint until it is fully hardened. This may take many hours. Do not smoke or use any products that contain nicotine or tobacco. If you need help quitting, ask your doctor. Ask your doctor when it is safe to drive if you have a splint. Keep all follow-up visits. How is this prevented? Warm up before you exercise. This helps to prevent more muscle strains. Contact a doctor if: You have more pain or swelling in the injured area. Get help right away if: You have any of these problems in your injured area: Numbness. Tingling. Less strength than normal. Summary A muscle strain is an injury that happens when a muscle is stretched beyond normal length. This condition is first treated with PRICE therapy. This includes protecting, resting, icing, adding pressure, and raising your injury. Limit your activity. Rest your injured muscle as told by your doctor. Your doctor may say that gentle movements are okay. Warm up before you exercise. This helps to  prevent more muscle strains. This information is not intended to replace advice given to you by your health care provider. Make sure you discuss any questions you have with your health care provider. Document Revised: 08/27/2022 Document Reviewed: 07/11/2020 Elsevier Patient Education  2024 [employer]   If you have been instructed to have an in-person evaluation today at a local Urgent Care facility, please use the link below. It will take you to a list of all of our available [facility], including address, phone number and hours of operation. Please do not delay care.  [facility]  If you or a family member do not have a primary care provider, use the link below to schedule a visit and establish care. When you choose a [facility] primary care physician or advanced practice provider, you gain a long-term partner in health. Find a Primary Care Provider  Learn  more about [facility]'s in-office and virtual care options: [facility] - Get Care Now

## 2023-08-06 NOTE — Progress Notes (Signed)
 Virtual Visit Consent   Samantha Hood, you are scheduled for a virtual visit with a  provider today. Just as with appointments in the office, your consent must be obtained to participate. Your consent will be active for this visit and any virtual visit you may have with one of our providers in the next 365 days. If you have a MyChart account, a copy of this consent can be sent to you electronically.  As this is a virtual visit, video technology does not allow for your provider to perform a traditional examination. This may limit your provider's ability to fully assess your condition. If your provider identifies any concerns that need to be evaluated in person or the need to arrange testing (such as labs, EKG, etc.), we will make arrangements to do so. Although advances in technology are sophisticated, we cannot ensure that it will always work on either your end or our end. If the connection with a video visit is poor, the visit may have to be switched to a telephone visit. With either a video or telephone visit, we are not always able to ensure that we have a secure connection.  By engaging in this virtual visit, you consent to the provision of healthcare and authorize for your insurance to be billed (if applicable) for the services provided during this visit. Depending on your insurance coverage, you may receive a charge related to this service.  I need to obtain your verbal consent now. Are you willing to proceed with your visit today? Samantha Hood has provided verbal consent on 08/06/2023 for a virtual visit (video or telephone). Freddy Finner, NP  Date: 08/06/2023 3:19 PM   Virtual Visit via Video Note   I, Freddy Finner, connected with  Samantha Hood  ([id number], Feb 07, 1996) on 08/06/23 at  3:15 PM EDT by a video-enabled telemedicine application and verified that I am speaking with the correct person using two identifiers.  Location: Patient: Virtual Visit Location Patient:  Home Provider: Virtual Visit Location Provider: Home Office   I discussed the limitations of evaluation and management by telemedicine and the availability of in person appointments. The patient expressed understanding and agreed to proceed.    History of Present Illness: Samantha Hood is a 28 y.o. who identifies as a female who was assigned female at birth, and is being seen today for right sided neck and arm pain.  Started having pain after waking up a few weeks ago.  Heating pad is not helping, has not tried Tylenol and Motrin  Mild sensation of falling asleep like feeling Denies chest pain, shortness of breath, fevers, chills.   Problems:  Patient Active Problem List   Diagnosis Date Noted   Encounter for initial prescription of implantable subdermal contraceptive 01/17/2023   Gestational hypertension 01/15/2023   Limited prenatal care, antepartum 01/08/2023   GBS (group B Streptococcus carrier), +RV culture, currently pregnant 01/04/2023   Supervision of high risk pregnancy, antepartum 12/31/2022   Unwanted fertility 12/31/2022   History of gestational hypertension 10/14/2020   Pap smear abnormality of cervix with LGSIL, +HPV 06/25/2020   Rh negative state in antepartum period 06/22/2020   Previous cesarean section complicating pregnancy, antepartum condition or complication 02/18/2019   History of VBAC 07/16/2016    Allergies: No Known Allergies Medications:  Current Outpatient Medications:    furosemide (LASIX) 40 MG tablet, Take 1 tablet (40 mg total) by mouth daily., Disp: 7 tablet, Rfl: 0   Prenatal Vit-Fe Fumarate-FA (  PREPLUS) 27-1 MG TABS, Take 1 tablet by mouth daily. (Patient not taking: Reported on 01/15/2023), Disp: 30 tablet, Rfl: 6   senna-docusate (SENOKOT-S) 8.6-50 MG tablet, Take 2 tablets by mouth daily., Disp: 30 tablet, Rfl: 0  Observations/Objective: Patient is well-developed, well-nourished in no acute distress.  Resting comfortably  at home.  Head is  normocephalic, atraumatic.  No labored breathing.  Speech is clear and coherent with logical content.  Patient is alert and oriented at baseline.    Assessment and Plan:   1. Right arm pain (Primary)  - ibuprofen (ADVIL) 600 MG tablet; Take 1 tablet (600 mg total) by mouth every 8 (eight) hours as needed.  Dispense: 30 tablet; Refill: 0  2. Neck pain, acute  - ibuprofen (ADVIL) 600 MG tablet; Take 1 tablet (600 mg total) by mouth every 8 (eight) hours as needed.  Dispense: 30 tablet; Refill: 0 - cyclobenzaprine (FLEXERIL) 5 MG tablet; Take 1 tablet (5 mg total) by mouth 2 (two) times daily as needed for muscle spasms.  Dispense: 30 tablet; Refill: 0  -reviewed possibility of MSK  -provided with above meds and reviewed the use. -follow up if not improving or worsening    Reviewed side effects, risks and benefits of medication.    Patient acknowledged agreement and understanding of the plan.   Past Medical, Surgical, Social History, Allergies, and Medications have been Reviewed.   Follow Up Instructions: I discussed the assessment and treatment plan with the patient. The patient was provided an opportunity to ask questions and all were answered. The patient agreed with the plan and demonstrated an understanding of the instructions.  A copy of instructions were sent to the patient via MyChart unless otherwise noted below.    The patient was advised to call back or seek an in-person evaluation if the symptoms worsen or if the condition fails to improve as anticipated.    Freddy Finner, NP

## 2023-09-23 ENCOUNTER — Encounter (HOSPITAL_COMMUNITY): Payer: Self-pay | Admitting: Emergency Medicine

## 2023-09-23 ENCOUNTER — Other Ambulatory Visit: Payer: Self-pay

## 2023-09-23 ENCOUNTER — Emergency Department (HOSPITAL_COMMUNITY)
Admission: EM | Admit: 2023-09-23 | Discharge: 2023-09-24 | Disposition: A | Discharge status: Home / Self Care | Attending: Emergency Medicine | Admitting: Emergency Medicine

## 2023-09-23 DIAGNOSIS — L234 Allergic contact dermatitis due to dyes: Secondary | ICD-10-CM | POA: Insufficient documentation

## 2023-09-23 DIAGNOSIS — T7840XA Allergy, unspecified, initial encounter: Secondary | ICD-10-CM

## 2023-09-23 NOTE — ED Triage Notes (Signed)
 Patient BIB EMS c/o allergic reaction to hair dye. Patient developed hives after dye touching her hair 1 hour ago. Patient took benadryl  50mcg at home. Patient denies SOB.

## 2023-09-23 NOTE — ED Provider Notes (Signed)
 [place] EMERGENCY DEPARTMENT AT [facility] Provider Note   CSN: [id number] Arrival date & time: 09/23/23  2154     History Chief Complaint  Patient presents with   Allergic Reaction    HPI Samantha Hood is a 28 y.o. female presenting for chief complaint of allergic reaction. States that she got a new hair dye tonight. Tried to keep it off her skin Welts all over her body Took benadryll and grossly improved  Patient's recorded medical, surgical, social, medication list and allergies were reviewed in the Snapshot window as part of the initial history.   Review of Systems   Review of Systems  Constitutional:  Negative for chills and fever.  HENT:  Negative for ear pain and sore throat.   Eyes:  Negative for pain and visual disturbance.  Respiratory:  Negative for cough and shortness of breath.   Cardiovascular:  Negative for chest pain and palpitations.  Gastrointestinal:  Negative for abdominal pain and vomiting.  Genitourinary:  Negative for dysuria and hematuria.  Musculoskeletal:  Negative for arthralgias and back pain.  Skin:  Positive for rash. Negative for color change.  Neurological:  Negative for seizures and syncope.  All other systems reviewed and are negative.   Physical Exam Updated Vital Signs BP (!) 133/97   Pulse 78   Temp 98 F (36.7 C)   Resp 18   Ht 5\' 3"  (1.6 m)   Wt 85 kg   SpO2 98%   BMI 33.19 kg/m  Physical Exam Vitals and nursing note reviewed.  Constitutional:      General: She is not in acute distress.    Appearance: She is well-developed.  HENT:     Head: Normocephalic and atraumatic.  Eyes:     Conjunctiva/sclera: Conjunctivae normal.  Cardiovascular:     Rate and Rhythm: Normal rate and regular rhythm.     Heart sounds: No murmur heard. Pulmonary:     Effort: Pulmonary effort is normal. No respiratory distress.     Breath sounds: Normal breath sounds.  Abdominal:     General: There is no distension.      Palpations: Abdomen is soft.     Tenderness: There is no abdominal tenderness. There is no right CVA tenderness or left CVA tenderness.  Musculoskeletal:        General: No swelling or tenderness. Normal range of motion.     Cervical back: Neck supple.  Skin:    General: Skin is warm and dry.  Neurological:     General: No focal deficit present.     Mental Status: She is alert and oriented to person, place, and time. Mental status is at baseline.     Cranial Nerves: No cranial nerve deficit.      ED Course/ Medical Decision Making/ A&P    Procedures Procedures   Medications Ordered in ED Medications  diphenhydrAMINE  (BENADRYL ) capsule 25 mg (25 mg Oral Patient Refused/Not Given 09/24/23 0038)  famotidine (PEPCID) tablet 20 mg (20 mg Oral Given 09/24/23 0036)  dexamethasone  (DECADRON ) tablet 12 mg (12 mg Oral Given 09/24/23 0036)    Medical Decision Making:   Samantha Hood is a 28 y.o. female who presented to the ED today with allergic reaction detailed above.    Patient placed on continuous vitals and telemetry monitoring while in ED which was reviewed periodically.  Complete initial physical exam performed, notably the patient  was hemodynamically stable no acute distress.    Reviewed and confirmed nursing  documentation for past medical history, family history, social history.    Initial Assessment:   Patient with a mild allergic reaction not meeting criteria for anaphylaxis.  Observed in the emergency department for 3 hours.  Treated with steroids and Pepcid and already improving after Benadryl  administration at home. Patient feels comfortable with outpatient care management strict return precautions reinforced.  Supportive care reinforced in the room. Disposition:  I have considered need for hospitalization, however, considering all of the above, I believe this patient is stable for discharge at this time.  Patient/family educated about specific return precautions for given  chief complaint and symptoms.  Patient/family educated about follow-up with PCP.     Patient/family expressed understanding of return precautions and need for follow-up. Patient spoken to regarding all imaging and laboratory results and appropriate follow up for these results. All education provided in verbal form with additional information in written form. Time was allowed for answering of patient questions. Patient discharged.    Emergency Department Medication Summary:   Medications  diphenhydrAMINE  (BENADRYL ) capsule 25 mg (25 mg Oral Patient Refused/Not Given 09/24/23 0038)  famotidine (PEPCID) tablet 20 mg (20 mg Oral Given 09/24/23 0036)  dexamethasone  (DECADRON ) tablet 12 mg (12 mg Oral Given 09/24/23 0036)         Clinical Impression:  1. Allergic reaction, initial encounter      Discharge   Final Clinical Impression(s) / ED Diagnoses Final diagnoses:  Allergic reaction, initial encounter    Rx / DC Orders ED Discharge Orders     None         Samantha Bile, MD 09/24/23 [phone]

## 2023-09-24 MED ORDER — DEXAMETHASONE 4 MG PO TABS
12.0000 mg | ORAL_TABLET | Freq: Once | ORAL | Status: AC
  Administered 2023-09-24: 12 mg via ORAL
  Filled 2023-09-24: qty 3

## 2023-09-24 MED ORDER — DIPHENHYDRAMINE HCL 25 MG PO CAPS
25.0000 mg | ORAL_CAPSULE | Freq: Once | ORAL | Status: DC
  Filled 2023-09-24: qty 1

## 2023-09-24 MED ORDER — FAMOTIDINE 20 MG PO TABS
20.0000 mg | ORAL_TABLET | Freq: Once | ORAL | Status: AC
  Administered 2023-09-24: 20 mg via ORAL
  Filled 2023-09-24: qty 1

## 2023-11-14 ENCOUNTER — Telehealth: Admitting: Physician Assistant

## 2023-11-14 DIAGNOSIS — K047 Periapical abscess without sinus: Secondary | ICD-10-CM

## 2023-11-14 MED ORDER — CLINDAMYCIN HCL 300 MG PO CAPS
300.0000 mg | ORAL_CAPSULE | Freq: Three times a day (TID) | ORAL | 0 refills | Status: AC
Start: 2023-11-14 — End: ?

## 2023-11-14 NOTE — Patient Instructions (Signed)
 Samantha Hood Shoulder, thank you for joining [name], PA-C for today's virtual visit.  While this provider is not your primary care provider (PCP), if your PCP is located in our provider database this encounter information will be shared with them immediately following your visit.   A [facility] MyChart account gives you access to today's visit and all your visits, tests, and labs performed at [facility]  click here if you don't have a [facility] MyChart account or go to [email or web address]  Consent: (Patient) Samantha Hood provided verbal consent for this virtual visit at the beginning of the encounter.  Current Medications:  Current Outpatient Medications:    clindamycin  (CLEOCIN ) 300 MG capsule, Take 1 capsule (300 mg total) by mouth 3 (three) times daily., Disp: 21 capsule, Rfl: 0   Medications ordered in this encounter:  Meds ordered this encounter  Medications   clindamycin  (CLEOCIN ) 300 MG capsule    Sig: Take 1 capsule (300 mg total) by mouth 3 (three) times daily.    Dispense:  21 capsule    Refill:  0    Supervising Provider:   BLAISE ALEENE KIDD [[id number]]     *If you need refills on other medications prior to your next appointment, please contact your pharmacy*  Follow-Up: Call back or seek an in-person evaluation if the symptoms worsen or if the condition fails to improve as anticipated.  [facility] [phone]  Other Instructions Dental Abscess  A dental abscess is an area of pus in or around a tooth. It comes from an infection. It can cause pain and other symptoms. Treatment will help with symptoms and prevent the infection from spreading. What are the causes? This condition is caused by an infection in or around the tooth. This can be from: Very bad tooth decay (cavities). A bad injury to the tooth, such as a broken or chipped tooth. What increases the risk? The risk to get an abscess is higher in males. It is also  more likely in people who: Have dental decay. Have very bad gum disease. Eat sugary snacks between meals. Use tobacco. Have diabetes. Have a weak disease-fighting system (immune system). Do not brush their teeth regularly. What are the signs or symptoms? Some mild symptoms are: Tenderness. Bad breath. Fever. A sharp, sour taste in the mouth. Pain in and around the infected tooth. Worse symptoms of this condition include: Swollen neck glands. Chills. Pus draining around the tooth. Swelling and redness around the tooth, the mouth, or the face. Very bad pain in and around the tooth. The worst symptoms can include: Difficulty swallowing. Difficulty opening your mouth. Feeling like you may vomit or vomiting. How is this treated? This is treated by getting rid of the infection. Your dentist will discuss ways to do this, including: Antibiotic medicines. Antibacterial mouth rinse. An incision in the abscess to drain out the pus. A root canal. Removing the tooth. Follow these instructions at home: Medicines Take over-the-counter and prescription medicines only as told by your dentist. If you were prescribed an antibiotic medicine, take it as told by your dentist. Do not stop taking it even if you start to feel better. If you were prescribed a gel that has numbing medicine in it, use it exactly as told. Ask your dentist if you should avoid driving or using machines while you are taking your medicine. General instructions Rinse your mouth often with salt water . To make salt water , dissolve -1 tsp (3-6 g) of  salt in 1 cup (237 mL) of warm water . Eat a soft diet while your mouth is healing. Drink enough fluid to keep your pee (urine) pale yellow. Do not apply heat to the outside of your mouth. Do not smoke or use any products that contain nicotine or tobacco. If you need help quitting, ask your dentist. Keep all follow-up visits. Prevent an abscess Brush your teeth every morning and  every night. Use fluoride toothpaste. Floss your teeth each day. Get dental cleanings as often as told by your dentist. Think about getting dental sealant put on teeth that have deep holes (decay). Drink water  that has fluoride in it. Most tap water  has fluoride. Check the label on bottled water  to see if it has fluoride in it. Drink water  instead of sugary drinks. Eat healthy meals and snacks. Wear a mouth guard or face shield when you play sports. Contact a doctor if: Your pain is worse and medicine does not help. Get help right away if: You have a fever or chills. Your symptoms suddenly get worse. You have a very bad headache. You have problems breathing or swallowing. You have trouble opening your mouth. You have swelling in your neck or close to your eye. These symptoms may be an emergency. Get help right away. Call your local emergency services (911 in the [place] Do not wait to see if the symptoms will go away. Do not drive yourself to the hospital. Summary A dental abscess is an area of pus in or around a tooth. It is caused by an infection. Treatment will help with symptoms and prevent the infection from spreading. Take over-the-counter and prescription medicines only as told by your dentist. To prevent an abscess, take good care of your teeth. Brush your teeth every morning and night. Use floss every day. Get dental cleanings as often as told by your dentist. This information is not intended to replace advice given to you by your health care provider. Make sure you discuss any questions you have with your health care provider. Document Revised: 06/29/2020 Document Reviewed: 06/30/2020 Elsevier Patient Education  2024 Elsevier Inc.   If you have been instructed to have an in-person evaluation today at a local Urgent Care facility, please use the link below. It will take you to a list of all of our available [facility], including address, phone number and hours  of operation. Please do not delay care.  [facility]  If you or a family member do not have a primary care provider, use the link below to schedule a visit and establish care. When you choose a [facility] primary care physician or advanced practice provider, you gain a long-term partner in health. Find a Primary Care Provider  Learn more about [facility]'s in-office and virtual care options: [facility] Get Care Now

## 2023-11-14 NOTE — Progress Notes (Signed)
 Virtual Visit Consent   Samantha Hood, you are scheduled for a virtual visit with a [facility] provider today. Just as with appointments in the office, your consent must be obtained to participate. Your consent will be active for this visit and any virtual visit you may have with one of our providers in the next 365 days. If you have a MyChart account, a copy of this consent can be sent to you electronically.  As this is a virtual visit, video technology does not allow for your provider to perform a traditional examination. This may limit your provider's ability to fully assess your condition. If your provider identifies any concerns that need to be evaluated in person or the need to arrange testing (such as labs, EKG, etc.), we will make arrangements to do so. Although advances in technology are sophisticated, we cannot ensure that it will always work on either your end or our end. If the connection with a video visit is poor, the visit may have to be switched to a telephone visit. With either a video or telephone visit, we are not always able to ensure that we have a secure connection.  By engaging in this virtual visit, you consent to the provision of healthcare and authorize for your insurance to be billed (if applicable) for the services provided during this visit. Depending on your insurance coverage, you may receive a charge related to this service.  I need to obtain your verbal consent now. Are you willing to proceed with your visit today? Samantha Hood has provided verbal consent on 11/14/2023 for a virtual visit (video or telephone). Samantha Hood, [place]  Date: 11/14/2023 4:07 PM   Virtual Visit via Video Note   I, Samantha Hood, connected with  Samantha Hood  ([id number], [birth year]) on 11/14/23 at  4:00 PM EDT by a video-enabled telemedicine application and verified that I am speaking with the correct person using two identifiers.  Location: Patient: Virtual Visit  Location Patient: Home Provider: Virtual Visit Location Provider: Home Office   I discussed the limitations of evaluation and management by telemedicine and the availability of in person appointments. The patient expressed understanding and agreed to proceed.    History of Present Illness: Samantha Hood is a 28 y.o. who identifies as a female who was assigned female at birth, and is being seen today for recurrence of abscessed tooth x 2 over the past week or so. Denies fevers, chills. Notes one of the areas has started draining on its own. Is trying to get in with a dental provider but has taken longer due to her insurance. Does have upcoming appointment next Wednesday.   Denies concerns for pregnancy.  Is not breastfeeding.  HPI: HPI  Problems:  Patient Active Problem List   Diagnosis Date Noted   Encounter for initial prescription of implantable subdermal contraceptive 01/17/2023   Gestational hypertension 01/15/2023   Limited prenatal care, antepartum 01/08/2023   GBS (group B Streptococcus carrier), +RV culture, currently pregnant 01/04/2023   Supervision of high risk pregnancy, antepartum 12/31/2022   Unwanted fertility 12/31/2022   History of gestational hypertension 10/14/2020   Pap smear abnormality of cervix with LGSIL, +HPV 06/25/2020   Rh negative state in antepartum period 06/22/2020   Previous cesarean section complicating pregnancy, antepartum condition or complication 02/18/2019   History of VBAC 07/16/2016    Allergies: No Known Allergies Medications:  Current Outpatient Medications:    clindamycin  (CLEOCIN ) 300 MG capsule, Take 1 capsule (  300 mg total) by mouth 3 (three) times daily., Disp: 21 capsule, Rfl: 0  Observations/Objective: Patient is well-developed, well-nourished in no acute distress.  Resting comfortably  at home.  Head is normocephalic, atraumatic.  No labored breathing.  Speech is clear and coherent with logical content.  Patient is alert and  oriented at baseline.   Assessment and Plan: 1. Dental infection (Primary) - clindamycin  (CLEOCIN ) 300 MG capsule; Take 1 capsule (300 mg total) by mouth 3 (three) times daily.  Dispense: 21 capsule; Refill: 0  Supportive measures and OTC medications reviewed.  Start Clindamycin  TID x 7 days giving recent use of Augmentin . Follow-up with dental provider as scheduled for further management.  Follow Up Instructions: I discussed the assessment and treatment plan with the patient. The patient was provided an opportunity to ask questions and all were answered. The patient agreed with the plan and demonstrated an understanding of the instructions.  A copy of instructions were sent to the patient via MyChart unless otherwise noted below.    The patient was advised to call back or seek an in-person evaluation if the symptoms worsen or if the condition fails to improve as anticipated.    Samantha Velma Lunger, PA-C

## 2023-11-29 ENCOUNTER — Ambulatory Visit: Admitting: Family Medicine

## 2023-12-13 ENCOUNTER — Telehealth

## 2023-12-13 ENCOUNTER — Encounter: Payer: Self-pay | Admitting: Nurse Practitioner

## 2024-05-12 ENCOUNTER — Ambulatory Visit: Payer: Self-pay | Admitting: Nurse Practitioner
